# Patient Record
Sex: Female | Born: 1960
Health system: Southern US, Community
[De-identification: ages and names within clinical notes are randomized; demographics above are authoritative.]

## PROBLEM LIST (undated history)

## (undated) DIAGNOSIS — G35 Multiple sclerosis: Secondary | ICD-10-CM

## (undated) DIAGNOSIS — G709 Myoneural disorder, unspecified: Secondary | ICD-10-CM

## (undated) DIAGNOSIS — I10 Essential (primary) hypertension: Secondary | ICD-10-CM

## (undated) DIAGNOSIS — M1990 Unspecified osteoarthritis, unspecified site: Secondary | ICD-10-CM

## (undated) HISTORY — PX: UTERINE FIBROID SURGERY: SHX826

---

## 1997-08-26 DIAGNOSIS — G35 Multiple sclerosis: Secondary | ICD-10-CM | POA: Insufficient documentation

## 1998-07-07 ENCOUNTER — Ambulatory Visit (HOSPITAL_COMMUNITY): Admission: RE | Admit: 1998-07-07 | Discharge: 1998-07-07 | Payer: Self-pay | Admitting: Neurosurgery

## 1999-03-27 ENCOUNTER — Emergency Department (HOSPITAL_COMMUNITY): Admission: EM | Admit: 1999-03-27 | Discharge: 1999-03-27 | Payer: Self-pay | Admitting: Emergency Medicine

## 1999-03-27 ENCOUNTER — Encounter: Payer: Self-pay | Admitting: Emergency Medicine

## 1999-05-03 ENCOUNTER — Encounter: Admission: RE | Admit: 1999-05-03 | Discharge: 1999-05-10 | Payer: Self-pay

## 1999-09-03 ENCOUNTER — Other Ambulatory Visit: Admission: RE | Admit: 1999-09-03 | Discharge: 1999-09-03 | Payer: Self-pay | Admitting: *Deleted

## 1999-09-17 ENCOUNTER — Ambulatory Visit (HOSPITAL_COMMUNITY): Admission: RE | Admit: 1999-09-17 | Discharge: 1999-09-17 | Payer: Self-pay | Admitting: *Deleted

## 1999-09-17 ENCOUNTER — Encounter: Payer: Self-pay | Admitting: *Deleted

## 2000-09-01 ENCOUNTER — Other Ambulatory Visit: Admission: RE | Admit: 2000-09-01 | Discharge: 2000-09-01 | Payer: Self-pay | Admitting: *Deleted

## 2000-09-17 ENCOUNTER — Encounter: Payer: Self-pay | Admitting: *Deleted

## 2000-09-17 ENCOUNTER — Ambulatory Visit (HOSPITAL_COMMUNITY): Admission: RE | Admit: 2000-09-17 | Discharge: 2000-09-17 | Payer: Self-pay | Admitting: *Deleted

## 2001-07-29 ENCOUNTER — Other Ambulatory Visit: Admission: RE | Admit: 2001-07-29 | Discharge: 2001-07-29 | Payer: Self-pay | Admitting: Obstetrics & Gynecology

## 2002-02-15 ENCOUNTER — Inpatient Hospital Stay (HOSPITAL_COMMUNITY): Admission: AD | Admit: 2002-02-15 | Discharge: 2002-02-18 | Payer: Self-pay | Admitting: Obstetrics & Gynecology

## 2002-03-31 ENCOUNTER — Other Ambulatory Visit: Admission: RE | Admit: 2002-03-31 | Discharge: 2002-03-31 | Payer: Self-pay | Admitting: Obstetrics & Gynecology

## 2002-09-08 ENCOUNTER — Ambulatory Visit (HOSPITAL_COMMUNITY): Admission: RE | Admit: 2002-09-08 | Discharge: 2002-09-08 | Payer: Self-pay | Admitting: Obstetrics & Gynecology

## 2002-09-08 ENCOUNTER — Encounter: Payer: Self-pay | Admitting: Obstetrics & Gynecology

## 2002-09-13 ENCOUNTER — Encounter: Payer: Self-pay | Admitting: Obstetrics & Gynecology

## 2002-09-13 ENCOUNTER — Ambulatory Visit (HOSPITAL_COMMUNITY): Admission: RE | Admit: 2002-09-13 | Discharge: 2002-09-13 | Payer: Self-pay | Admitting: Obstetrics & Gynecology

## 2003-04-21 ENCOUNTER — Other Ambulatory Visit: Admission: RE | Admit: 2003-04-21 | Discharge: 2003-04-21 | Payer: Self-pay | Admitting: Obstetrics & Gynecology

## 2003-09-12 ENCOUNTER — Ambulatory Visit (HOSPITAL_COMMUNITY): Admission: RE | Admit: 2003-09-12 | Discharge: 2003-09-12 | Payer: Self-pay | Admitting: Obstetrics & Gynecology

## 2004-05-14 ENCOUNTER — Other Ambulatory Visit: Admission: RE | Admit: 2004-05-14 | Discharge: 2004-05-14 | Payer: Self-pay | Admitting: Obstetrics & Gynecology

## 2004-09-24 ENCOUNTER — Ambulatory Visit (HOSPITAL_COMMUNITY): Admission: RE | Admit: 2004-09-24 | Discharge: 2004-09-24 | Payer: Self-pay | Admitting: Obstetrics & Gynecology

## 2004-12-18 ENCOUNTER — Other Ambulatory Visit: Admission: RE | Admit: 2004-12-18 | Discharge: 2004-12-18 | Payer: Self-pay | Admitting: Obstetrics & Gynecology

## 2005-05-14 ENCOUNTER — Other Ambulatory Visit: Admission: RE | Admit: 2005-05-14 | Discharge: 2005-05-14 | Payer: Self-pay | Admitting: Obstetrics & Gynecology

## 2005-09-30 ENCOUNTER — Ambulatory Visit (HOSPITAL_COMMUNITY): Admission: RE | Admit: 2005-09-30 | Discharge: 2005-09-30 | Payer: Self-pay | Admitting: Obstetrics & Gynecology

## 2005-11-06 ENCOUNTER — Other Ambulatory Visit: Admission: RE | Admit: 2005-11-06 | Discharge: 2005-11-06 | Payer: Self-pay | Admitting: Obstetrics & Gynecology

## 2006-10-02 ENCOUNTER — Ambulatory Visit (HOSPITAL_COMMUNITY): Admission: RE | Admit: 2006-10-02 | Discharge: 2006-10-02 | Payer: Self-pay | Admitting: Obstetrics & Gynecology

## 2007-10-05 ENCOUNTER — Ambulatory Visit (HOSPITAL_COMMUNITY): Admission: RE | Admit: 2007-10-05 | Discharge: 2007-10-05 | Payer: Self-pay | Admitting: Obstetrics & Gynecology

## 2008-03-15 ENCOUNTER — Emergency Department (HOSPITAL_COMMUNITY): Admission: EM | Admit: 2008-03-15 | Discharge: 2008-03-15 | Payer: Self-pay | Admitting: Family Medicine

## 2008-10-07 ENCOUNTER — Encounter: Admission: RE | Admit: 2008-10-07 | Discharge: 2008-10-07 | Payer: Self-pay | Admitting: Obstetrics & Gynecology

## 2009-10-20 ENCOUNTER — Encounter: Admission: RE | Admit: 2009-10-20 | Discharge: 2009-10-20 | Payer: Self-pay | Admitting: Obstetrics & Gynecology

## 2010-05-18 ENCOUNTER — Ambulatory Visit (HOSPITAL_COMMUNITY): Admission: RE | Admit: 2010-05-18 | Discharge: 2010-05-18 | Payer: Self-pay | Admitting: Neurology

## 2010-09-15 ENCOUNTER — Other Ambulatory Visit: Payer: Self-pay | Admitting: Obstetrics & Gynecology

## 2010-09-15 DIAGNOSIS — Z1239 Encounter for other screening for malignant neoplasm of breast: Secondary | ICD-10-CM

## 2010-09-16 ENCOUNTER — Encounter: Payer: Self-pay | Admitting: Obstetrics & Gynecology

## 2010-10-22 ENCOUNTER — Ambulatory Visit
Admission: RE | Admit: 2010-10-22 | Discharge: 2010-10-22 | Disposition: A | Discharge status: Home / Self Care | Payer: Commercial Managed Care - PPO | Source: Ambulatory Visit | Attending: Obstetrics & Gynecology | Admitting: Obstetrics & Gynecology

## 2010-10-22 DIAGNOSIS — Z1239 Encounter for other screening for malignant neoplasm of breast: Secondary | ICD-10-CM

## 2011-01-11 NOTE — Discharge Summary (Signed)
[facility]  Patient:    Jill Hall, Jill Hall Visit Number: [id number] MRN: [id number]          Service Type: OBS Location: 910A 9109 01 Attending Physician:  Minette Headland Dictated by:   Julio Sicks, N.P. Admit Date:  02/15/2002 Discharge Date: 02/18/2002                             Discharge Summary  ADMISSION DIAGNOSES:          1. Intrauterine pregnancy at term.                               2. 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                               3. Advanced maternal age.                               4. History of multiple sclerosis.  DISCHARGE DIAGNOSES:          1. Primary cesarean delivery.                               2. Viable female infant.  PROCEDURE:                    Primary low transverse cesarean section.  REASON FOR ADMISSION:         Please see dictated H&P.  HOSPITAL COURSE:              The patient was admitted to [facility] for the above named procedure.  The patient was taken to the operating room where spinal anesthesia was administered without difficulty.  A low transverse incision was made with the delivery of a viable female infant weighing 6 pounds 10 ounces with Apgars of 8 at one minute and 9 at five minutes.  Umbilical cord pH was 7.31.  The patient tolerated the procedure well and was taken to the recovery room in stable condition.  On postoperative day one, the patient had adequate return of bowel function.  Abdomen was soft. Abdominal dressing was partially removed and incision was noted to be clean, dry and intact.  Foley had been discontinued and patient was voiding without difficulty.  The patient was tolerating a clear liquid diet without complaints of nausea and vomiting.  Labs revealed a hemoglobin of 9.1, hematocrit 27.2, and WBC count of 7.6.  On postoperative day two, abdomen was soft, fundus was firm, incision was clean,  dry and intact and staples were intact.  The patient was ambulating well.  On postoperative day three, the patient was doing well. Staples were removed and the patient was discharged to home.  CONDITION ON DISCHARGE:       Good.  DIET:                         Regular as tolerated.  ACTIVITY:  No heavy lifting.  No driving times two weeks. No vaginal entry.  FOLLOW-UP:                    The patient is to follow up in the office in one to two weeks for an incision check.  She is to call for a temperature greater than 100 degrees, persistent nausea or vomiting, heavy vaginal bleeding and/or redness or drainage from the incision site.  DISCHARGE MEDICATIONS:        1. Ibuprofen 600 mg q.6h.                               2. Prenatal vitamins one p.o. q.d.                               3. Hemocyte one p.o. q.d.                               4. Percocet 5/325, #30, one p.o. q.4-6h.                                  p.r.n. pain. Dictated by:   Julio Sicks, N.P. Attending Physician:  Minette Headland DD:  03/11/02 TD:  03/16/02 Job: [id number]

## 2011-01-11 NOTE — Op Note (Signed)
[facility]  Patient:    Jill Hall, Jill Hall Visit Number: [id number] MRN: [id number]          Service Type: OBS Location: 910B 9198 06 Attending Physician:  Minette Headland Dictated by:   Freddy Finner, M.D. Proc. Date: 02/15/02 Admit Date:  02/15/2002                             Operative Report  PREOPERATIVE DIAGNOSES:       1. Intrauterine pregnancy at term.                               2. Surgically scarred uterus from previous                                  myomectomy.  POSTOPERATIVE DIAGNOSES:      1. Intrauterine pregnancy at term.                               2. Surgically scarred uterus from previous                                  myomectomy.  OPERATION:                    Primary low vertical cesarean section with delivery of viable female infant, Apgars 8 and 9, cord pH 7.31, birth weight 6 pounds 10 ounces.  SURGEON:                      Freddy Finner, M.D.  ASSISTANT:                    Juluis Mire, M.D.  ANESTHESIA:                   Spinal.  ESTIMATED BLOOD LOSS:         600 cc.  INTRAOPERATIVE COMPLICATIONS:                None.  INDICATIONS:                  The patient is a 50 year old primigravida who was followed through an uneventful prenatal course to term.  She was admitted for a cesarean due to previous surgical scarring of the uterus.  DESCRIPTION OF PROCEDURE:     She was admitted on the morning of surgery and brought to the operating room and there placed under adequate spinal anesthesia, placed in the dorsal recumbent position.  The abdomen was prepped and draped in the usual fashion and a Foley catheter placed using sterile technique.  A lower abdominal transverse incision is made and carried sharply down to fascia which was entered sharply and extended to the extent of the skin incision.  The rectus sheath was developed superiorly and inferiorly with blunt and sharp dissection.  The rectus  muscles were divided in the midline, the peritoneum was entered sharply and extended bluntly to the extent of the skin incision.  A bladder blade was placed.  The bladder was dissected off the lower segment.  An initial attempt was made to make a transverse incision in the  lower segment, but it became immediately obvious that the uterine lower segment was so thickened that this was not the appropriate choice of incision and for that reason a low vertical incision was made.  Membranes were exposed and ruptured.  The infant was in a complete breech presentation and a breech extraction was then accomplished without difficulty.  Apgars and cord pH are noted above.  The placenta and other parts of conception were removed from the uterus.  The uterine incision was closed in a double layer with running-locking 0 Monocryl for each layer.  The bladder flap was reapproximated with an interrupted 0 Monocryl suture.  Irrigation was carried out and hemostasis was complete.  Extensive adhesions were palpable, no palpable enlargement of the ovary was noted on the left side.  On the right side the ovary was palpably normal.  There was also a paraovarian 2.5-cm cyst which was visualized and it was completely simple and benign in appearance. All packs, needles and instruments were removed from the abdomen.  The abdominal incision was then closed in layers; running 0 Monocryl was used to close the peritoneum and reapproximate the rectus muscles, the fascia was closed with a running 0 PDS, the skin was closed with wide skin staples and quarter-inch Steri-Strips.  The patient tolerated the procedure well and was taken to recovery in good condition. Dictated by:   Freddy Finner, M.D. Attending Physician:  Minette Headland DD:  02/15/02 TD:  02/15/02 Job: [id number]

## 2011-01-11 NOTE — H&P (Signed)
[facility]  Patient:    Jill Hall, Jill Hall Visit Number: [id number] MRN: [id number]          Service Type: Attending:  Freddy Finner, M.D. Dictated by:   Freddy Finner, M.D. Adm. Date:  02/15/02                           History and Physical  ADMISSION DIAGNOSES:          1. Intrauterine pregnancy at term.                               2. 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                               3. Advanced maternal age.  HISTORY OF PRESENT ILLNESS:   The patient is a 50 year old, black, married female, primigravida, who had previous myomectomy with deep incisions into the uterus.  This is her first pregnancy, which has been essentially uncomplicated.  She is known to have multiple sclerosis.  This has not presented a problem during her pregnancy.  Because of the myomectomy, she is admitted for surgical delivery.  REVIEW OF SYSTEMS:            Her current review of systems is negative.  PHYSICAL EXAMINATION:         HEENT is grossly normal.  NECK:                         The thyroid gland is not palpably enlarged.  VITAL SIGNS:                  Blood pressure 120/72.  CHEST:                        Clear to auscultation.  HEART:                        Normal sinus rhythm without murmurs, rubs, or gallops.  ABDOMEN:                      Gravid.  Estimated fetal size of 8 pounds.  EXTREMITIES:                  Without significant edema and without clubbing or cyanosis.  ASSESSMENT:                   1. Intrauterine pregnancy at term.                               2. Surgically scarred uterus.                               3. Advanced maternal age.                               4. History of multiple  sclerosis.  PLAN:                         Cesarean delivery. Dictated by:   Freddy Finner, M.D. Attending:  Freddy Finner, M.D. DD:  02/11/02 TD:  02/11/02 Job: [id number]

## 2011-07-26 ENCOUNTER — Other Ambulatory Visit: Payer: Self-pay | Admitting: Family Medicine

## 2011-07-26 ENCOUNTER — Other Ambulatory Visit: Payer: Self-pay | Admitting: Obstetrics & Gynecology

## 2011-07-26 DIAGNOSIS — R0989 Other specified symptoms and signs involving the circulatory and respiratory systems: Secondary | ICD-10-CM

## 2011-07-26 DIAGNOSIS — Z1231 Encounter for screening mammogram for malignant neoplasm of breast: Secondary | ICD-10-CM

## 2011-08-02 ENCOUNTER — Ambulatory Visit
Admission: RE | Admit: 2011-08-02 | Discharge: 2011-08-02 | Disposition: A | Discharge status: Home / Self Care | Payer: 59 | Source: Ambulatory Visit | Attending: Family Medicine | Admitting: Family Medicine

## 2011-08-02 DIAGNOSIS — R0989 Other specified symptoms and signs involving the circulatory and respiratory systems: Secondary | ICD-10-CM

## 2011-08-05 ENCOUNTER — Ambulatory Visit (INDEPENDENT_AMBULATORY_CARE_PROVIDER_SITE_OTHER): Payer: Self-pay | Admitting: Pharmacist

## 2011-08-05 ENCOUNTER — Encounter: Payer: Self-pay | Admitting: Pharmacist

## 2011-08-05 VITALS — BP 139/96 | HR 110 | Ht 67.0 in | Wt 201.8 lb

## 2011-08-05 DIAGNOSIS — Z309 Encounter for contraceptive management, unspecified: Secondary | ICD-10-CM | POA: Insufficient documentation

## 2011-08-05 DIAGNOSIS — G35 Multiple sclerosis: Secondary | ICD-10-CM

## 2011-08-05 NOTE — Assessment & Plan Note (Signed)
Following medication review, no suggestions for change.  Complete medication list provided to patient.  Total time in face to face medication review: 20 minutes.  Patient seen with: Albertine Grates, PharmD Resident

## 2011-08-05 NOTE — Progress Notes (Signed)
  Subjective:    Patient ID: Jill Hall, female    DOB: 1961/07/07, 49 y.o.   MRN: [id number]  HPI Patient arrives in good spirits for medication review.  Reports seeing Cammie Fulp as primary care provider,  Despina Arias as neurologist, and GYN [facility].  Reports being diagnosed with Multiple Sclerosisfor in 1999 (13 years) and states she is under acceptable level of control.      Review of Systems     Objective:   Physical Exam        Assessment & Plan:  Following medication review, no suggestions for change.  Complete medication list provided to patient.  Total time in face to face medication review: 20 minutes.  Patient seen with: Albertine Grates, PharmD Resident

## 2011-08-05 NOTE — Progress Notes (Signed)
  Subjective:    Patient ID: Jill Hall, female    DOB: 1961/06/05, 50 y.o.   MRN: [id number]  HPI Reviewed and agree with Dr. Macky Lower management.    Review of Systems     Objective:   Physical Exam        Assessment & Plan:

## 2011-10-24 ENCOUNTER — Ambulatory Visit
Admission: RE | Admit: 2011-10-24 | Discharge: 2011-10-24 | Disposition: A | Discharge status: Home / Self Care | Payer: 59 | Source: Ambulatory Visit | Attending: Obstetrics & Gynecology | Admitting: Obstetrics & Gynecology

## 2011-10-24 DIAGNOSIS — Z1231 Encounter for screening mammogram for malignant neoplasm of breast: Secondary | ICD-10-CM

## 2011-11-29 ENCOUNTER — Encounter (HOSPITAL_COMMUNITY): Payer: Self-pay

## 2011-11-29 ENCOUNTER — Emergency Department (INDEPENDENT_AMBULATORY_CARE_PROVIDER_SITE_OTHER)
Admission: EM | Admit: 2011-11-29 | Discharge: 2011-11-29 | Disposition: A | Discharge status: Home Health | Payer: 59 | Source: Home / Self Care | Attending: Family Medicine | Admitting: Family Medicine

## 2011-11-29 DIAGNOSIS — J302 Other seasonal allergic rhinitis: Secondary | ICD-10-CM

## 2011-11-29 DIAGNOSIS — J309 Allergic rhinitis, unspecified: Secondary | ICD-10-CM

## 2011-11-29 HISTORY — DX: Multiple sclerosis: G35

## 2011-11-29 MED ORDER — AZELASTINE HCL 0.15 % NA SOLN: 1.0000 | Freq: Two times a day (BID) | NASAL | Status: DC

## 2011-11-29 MED ORDER — FLUTICASONE PROPIONATE 50 MCG/ACT NA SUSP: 2.0000 | Freq: Every day | NASAL | Status: DC

## 2011-11-29 MED ORDER — METHYLPREDNISOLONE ACETATE 40 MG/ML IJ SUSP
80.0000 mg | Freq: Once | INTRAMUSCULAR | Status: AC
  Administered 2011-11-29: 80 mg via INTRAMUSCULAR

## 2011-11-29 MED ORDER — METHYLPREDNISOLONE ACETATE 80 MG/ML IJ SUSP
INTRAMUSCULAR | Status: AC
  Filled 2011-11-29: qty 1

## 2011-11-29 MED ORDER — CETIRIZINE HCL 10 MG PO TABS: 10.0000 mg | ORAL_TABLET | Freq: Every day | ORAL | Status: DC

## 2011-11-29 NOTE — ED Provider Notes (Signed)
History     CSN: [id number]  Arrival date & time 11/29/11  7829   First MD Initiated Contact with Patient 11/29/11 [phone]      Chief Complaint  Patient presents with  . Cough    (Consider location/radiation/quality/duration/timing/severity/associated sxs/prior treatment) Patient is a 51 y.o. female presenting with cough. The history is provided by the patient.  Cough This is a new problem. The current episode started 2 days ago. The problem has been gradually worsening. The cough is productive of sputum. There has been no fever. Associated symptoms include chest pain and rhinorrhea. Pertinent negatives include no shortness of breath and no wheezing. Risk factors: daughter has been similarly sick. She is not a smoker.    Past Medical History  Diagnosis Date  . Multiple sclerosis     Past Surgical History  Procedure Date  . Cesarean section   . Uterine fibroid surgery     No family history on file.  History  Substance Use Topics  . Smoking status: Never Smoker   . Smokeless tobacco: Never Used  . Alcohol Use: No    OB History    Grav Para Term Preterm Abortions TAB SAB Ect Mult Living                  Review of Systems  Constitutional: Negative.   HENT: Positive for congestion, rhinorrhea, sneezing and postnasal drip.   Respiratory: Positive for cough. Negative for shortness of breath and wheezing.   Cardiovascular: Positive for chest pain.  Gastrointestinal: Negative.     Allergies  Review of patient's allergies indicates no known allergies.  Home Medications   Current Outpatient Rx  Name Route Sig Dispense Refill  . NORETHIN ACE-ETH ESTRAD-FE 1-20 MG-MCG PO TABS Oral Take 1 tablet by mouth daily.    . AZELASTINE HCL 0.15 % NA SOLN Nasal Place 1 spray into the nose 2 (two) times daily. 30 mL 1  . CETIRIZINE HCL 10 MG PO TABS Oral Take 1 tablet (10 mg total) by mouth daily. One tab daily for allergies 30 tablet 1  . CHOLECALCIFEROL 2000 UNITS PO TABS Oral  Take 1 tablet (2,000 Units total) by mouth daily.    Marland Kitchen FLUTICASONE PROPIONATE 50 MCG/ACT NA SUSP Nasal Place 2 sprays into the nose daily. 1 g 2  . INTERFERON BETA-1B 0.3 MG [place] SOLR Subcutaneous Inject 0.25 mg into the skin every other day.    Darlis Loan ESTRAD TRIPHASIC 0.18/0.215/0.25 MG-35 MCG PO TABS Oral Take 1 tablet by mouth daily. Sunday  Starts      BP 145/83  Pulse 112  Temp(Src) 98.8 F (37.1 C) (Oral)  Resp 14  SpO2 99%  LMP 11/05/2011  Physical Exam  Nursing note and vitals reviewed. Constitutional: She is oriented to person, place, and time. She appears well-developed and well-nourished.  HENT:  Head: Normocephalic.  Right Ear: External ear normal.  Left Ear: External ear normal.  Nose: Mucosal edema and rhinorrhea present.  Mouth/Throat: Oropharynx is clear and moist.  Neck: Normal range of motion. Neck supple.  Cardiovascular: Regular rhythm and normal heart sounds.   Pulmonary/Chest: Effort normal and breath sounds normal.  Lymphadenopathy:    She has no cervical adenopathy.  Neurological: She is alert and oriented to person, place, and time.  Skin: Skin is warm and dry.  Psychiatric: She has a normal mood and affect.    ED Course  Procedures (including critical care time)  Labs Reviewed - No data to display No results  found.   1. Seasonal allergic rhinitis       MDM          Linna Hoff, MD 11/29/11 1053

## 2011-11-29 NOTE — ED Notes (Signed)
Pt c/o cough and congestion.  Productive cough with clear sputum onset Wednesday.  Pt states she has felt febrile at times.  Pt using Mucinex at home with some relief.

## 2012-06-23 ENCOUNTER — Ambulatory Visit: Payer: 59 | Admitting: Pharmacist

## 2012-06-29 ENCOUNTER — Ambulatory Visit (INDEPENDENT_AMBULATORY_CARE_PROVIDER_SITE_OTHER): Payer: 59 | Admitting: Pharmacist

## 2012-06-29 ENCOUNTER — Encounter: Payer: Self-pay | Admitting: Pharmacist

## 2012-06-29 VITALS — BP 157/90 | HR 96 | Ht 67.0 in | Wt 206.5 lb

## 2012-06-29 DIAGNOSIS — G35 Multiple sclerosis: Secondary | ICD-10-CM

## 2012-06-29 NOTE — Assessment & Plan Note (Signed)
Following medication review, no suggestions for change.  Complete medication list provided to patient.  Total time in face to face medication review: 15 minutes.  Patient seen with: Bola Lawuyi PharmD, Pharmacy Resident. 

## 2012-06-29 NOTE — Progress Notes (Signed)
  Subjective:    Patient ID: Jill Hall, female    DOB: 11-Feb-1961, 51 y.o.   MRN: [id number]  HPI Patient arrives in good spirits for medication review. Reports seeing Cammie Fulp as primary care provider, Despina Arias as neurologist, and GYN [facility]. Reports being diagnosed with Multiple Sclerosisfor in 1999 (13 years) and states she is under acceptable level of control.    Had recent sinus infection. "  Had 2 sinus infections this past year" requiring treatment with prednisone and antibiotics.   Review of Systems     Objective:   Physical Exam        Assessment & Plan:  Following medication review, no suggestions for change.  Complete medication list provided to patient.  Total time in face to face medication review: 15 minutes.  Patient seen with: Maryanna Shape PharmD, Pharmacy Resident.

## 2012-06-29 NOTE — Progress Notes (Signed)
Patient ID: Jill Hall, female   DOB: Jun 14, 1961, 51 y.o.   MRN: [id number] Reviewed and agree with Dr. Macky Lower documentation and management.

## 2012-06-29 NOTE — Patient Instructions (Addendum)
Thanks for coming in for your med review.

## 2012-08-24 ENCOUNTER — Other Ambulatory Visit: Payer: Self-pay | Admitting: Obstetrics & Gynecology

## 2012-08-24 DIAGNOSIS — Z1231 Encounter for screening mammogram for malignant neoplasm of breast: Secondary | ICD-10-CM

## 2012-10-26 ENCOUNTER — Ambulatory Visit
Admission: RE | Admit: 2012-10-26 | Discharge: 2012-10-26 | Disposition: A | Discharge status: Home / Self Care | Payer: 59 | Source: Ambulatory Visit | Attending: Obstetrics & Gynecology | Admitting: Obstetrics & Gynecology

## 2012-10-26 DIAGNOSIS — Z1231 Encounter for screening mammogram for malignant neoplasm of breast: Secondary | ICD-10-CM

## 2012-12-29 ENCOUNTER — Encounter (HOSPITAL_COMMUNITY): Payer: Self-pay | Admitting: Pharmacist

## 2013-01-08 ENCOUNTER — Encounter (HOSPITAL_COMMUNITY): Payer: Self-pay

## 2013-01-08 ENCOUNTER — Encounter (HOSPITAL_COMMUNITY)
Admission: RE | Admit: 2013-01-08 | Discharge: 2013-01-08 | Disposition: A | Discharge status: Home / Self Care | Payer: 59 | Source: Ambulatory Visit | Attending: Obstetrics and Gynecology | Admitting: Obstetrics and Gynecology

## 2013-01-08 DIAGNOSIS — Z01818 Encounter for other preprocedural examination: Secondary | ICD-10-CM | POA: Insufficient documentation

## 2013-01-08 DIAGNOSIS — Z01812 Encounter for preprocedural laboratory examination: Secondary | ICD-10-CM | POA: Insufficient documentation

## 2013-01-08 HISTORY — DX: Myoneural disorder, unspecified: G70.9

## 2013-01-08 LAB — CBC
HCT: 37.3 % (ref 36.0–46.0)
Hemoglobin: 12.7 g/dL (ref 12.0–15.0)
MCHC: 34 g/dL (ref 30.0–36.0)
RBC: 4.54 MIL/uL (ref 3.87–5.11)
WBC: 6.4 10*3/uL (ref 4.0–10.5)

## 2013-01-08 NOTE — Patient Instructions (Signed)
Your procedure is scheduled on:01/15/13  Enter through the Main Entrance at :6am Pick up desk phone and dial [phone] and inform us of your arrival.  Please call [phone] if you have any problems the morning of surgery.  Remember: Do not eat or drink after midnight:Thursday   DO NOT wear jewelry, eye make-up, lipstick,body lotion, or dark fingernail polish.   Patients discharged on the day of surgery will not be allowed to drive home.

## 2013-01-11 NOTE — H&P (Addendum)
Jill Hall  [id number]  12/30/12 Jill Hall is a 52 yo G1P1 with menorrhagia and endomettrial mass most consistent with an intracavitary fibroid.  Followed by Dr Jennette Kettle, she deisres surgical resection of this fiibroid.  Jill Hall presents today for preop evaluation.  She has a history of fibroids.  She has been having problems with menorrhagia.  She is currently on Lo-Loestrin.  Saline infusion ultrasound recently showed a 2.5 cm intracavitary mass consistent with a fibroid.  Reviewed the past saline infusion ultrasound and past labs.  She is not menopausal.  Discussed the procedure at length, its risks and benefits and success and complication rates.  Discussed the fact that these could recur, may or may not help the bleeding.  Will send this off to pathology.  The risks include but are not limited to risk of bleeding, damage to uterus, tubes, ovaries, bowel or bladder, risks associated with anesthesia or blood transfusion or having to open her up if there are any complications.  Answered all of her questions.  She does give informed consent. Dineen Kid Rana Snare, MD/rg  This patient has been seen and examined.   All of her questions were answered.  Labs and vital signs reviewed.  Informed consent has been obtained.  The History and Physical is current.This patient has been seen and examined.   All of her questions were answered.  Labs and vital signs reviewed.  Informed consent has been obtained.  The History and Physical is current. 01/15/13 DL

## 2013-01-14 MED ORDER — DEXTROSE 5 % IV SOLN
2.0000 g | INTRAVENOUS | Status: AC
  Administered 2013-01-15: 2 g via INTRAVENOUS
  Filled 2013-01-14: qty 2

## 2013-01-15 ENCOUNTER — Encounter (HOSPITAL_COMMUNITY)
Admission: RE | Disposition: A | Discharge status: Home / Self Care | Payer: Self-pay | Source: Ambulatory Visit | Attending: Obstetrics and Gynecology

## 2013-01-15 ENCOUNTER — Ambulatory Visit (HOSPITAL_COMMUNITY)
Admission: RE | Admit: 2013-01-15 | Discharge: 2013-01-15 | Disposition: A | Discharge status: Home / Self Care | Payer: 59 | Source: Ambulatory Visit | Attending: Obstetrics and Gynecology | Admitting: Obstetrics and Gynecology

## 2013-01-15 ENCOUNTER — Encounter (HOSPITAL_COMMUNITY): Payer: Self-pay | Admitting: *Deleted

## 2013-01-15 ENCOUNTER — Ambulatory Visit (HOSPITAL_COMMUNITY): Payer: 59 | Admitting: Anesthesiology

## 2013-01-15 ENCOUNTER — Encounter (HOSPITAL_COMMUNITY): Payer: Self-pay | Admitting: Anesthesiology

## 2013-01-15 DIAGNOSIS — Z9889 Other specified postprocedural states: Secondary | ICD-10-CM

## 2013-01-15 DIAGNOSIS — N920 Excessive and frequent menstruation with regular cycle: Secondary | ICD-10-CM | POA: Insufficient documentation

## 2013-01-15 DIAGNOSIS — N840 Polyp of corpus uteri: Secondary | ICD-10-CM | POA: Insufficient documentation

## 2013-01-15 DIAGNOSIS — D250 Submucous leiomyoma of uterus: Secondary | ICD-10-CM | POA: Insufficient documentation

## 2013-01-15 LAB — PREGNANCY, URINE: Preg Test, Ur: NEGATIVE

## 2013-01-15 SURGERY — DILATATION & CURETTAGE/HYSTEROSCOPY WITH TRUCLEAR
Anesthesia: General | Site: Vagina | Wound class: Clean Contaminated

## 2013-01-15 MED ORDER — ONDANSETRON HCL 4 MG/2ML IJ SOLN
INTRAMUSCULAR | Status: DC | PRN
  Administered 2013-01-15: 4 mg via INTRAVENOUS

## 2013-01-15 MED ORDER — LACTATED RINGERS IV SOLN
INTRAVENOUS | Status: DC
  Administered 2013-01-15: 50 mL/h via INTRAVENOUS

## 2013-01-15 MED ORDER — MIDAZOLAM HCL 2 MG/2ML IJ SOLN
INTRAMUSCULAR | Status: AC
  Filled 2013-01-15: qty 2

## 2013-01-15 MED ORDER — FENTANYL CITRATE 0.05 MG/ML IJ SOLN: 25.0000 ug | INTRAMUSCULAR | Status: DC | PRN

## 2013-01-15 MED ORDER — PROMETHAZINE HCL 25 MG/ML IJ SOLN: 6.2500 mg | INTRAMUSCULAR | Status: DC | PRN

## 2013-01-15 MED ORDER — PROPOFOL 10 MG/ML IV BOLUS
INTRAVENOUS | Status: DC | PRN
  Administered 2013-01-15: 200 mg via INTRAVENOUS

## 2013-01-15 MED ORDER — FENTANYL CITRATE 0.05 MG/ML IJ SOLN
INTRAMUSCULAR | Status: AC
  Filled 2013-01-15: qty 2

## 2013-01-15 MED ORDER — LIDOCAINE HCL (CARDIAC) 20 MG/ML IV SOLN
INTRAVENOUS | Status: AC
  Filled 2013-01-15: qty 5

## 2013-01-15 MED ORDER — KETOROLAC TROMETHAMINE 30 MG/ML IJ SOLN
INTRAMUSCULAR | Status: AC
  Filled 2013-01-15: qty 1

## 2013-01-15 MED ORDER — KETOROLAC TROMETHAMINE 30 MG/ML IJ SOLN
INTRAMUSCULAR | Status: DC | PRN
  Administered 2013-01-15: 30 mg via INTRAVENOUS

## 2013-01-15 MED ORDER — SODIUM CHLORIDE 0.9 % IR SOLN
Status: DC | PRN
  Administered 2013-01-15: 3000 mL

## 2013-01-15 MED ORDER — LIDOCAINE HCL (CARDIAC) 20 MG/ML IV SOLN
INTRAVENOUS | Status: DC | PRN
  Administered 2013-01-15: 30 mg via INTRAVENOUS
  Administered 2013-01-15: 40 mg via INTRAVENOUS

## 2013-01-15 MED ORDER — PROPOFOL 10 MG/ML IV EMUL
INTRAVENOUS | Status: AC
  Filled 2013-01-15: qty 20

## 2013-01-15 MED ORDER — MIDAZOLAM HCL 2 MG/2ML IJ SOLN: 0.5000 mg | Freq: Once | INTRAMUSCULAR | Status: DC | PRN

## 2013-01-15 MED ORDER — MIDAZOLAM HCL 5 MG/5ML IJ SOLN
INTRAMUSCULAR | Status: DC | PRN
  Administered 2013-01-15: 2 mg via INTRAVENOUS

## 2013-01-15 MED ORDER — FENTANYL CITRATE 0.05 MG/ML IJ SOLN
INTRAMUSCULAR | Status: DC | PRN
  Administered 2013-01-15 (×2): 50 ug via INTRAVENOUS

## 2013-01-15 MED ORDER — FENTANYL CITRATE 0.05 MG/ML IJ SOLN
INTRAMUSCULAR | Status: AC
  Administered 2013-01-15: 50 ug via INTRAVENOUS
  Filled 2013-01-15: qty 2

## 2013-01-15 MED ORDER — KETOROLAC TROMETHAMINE 30 MG/ML IJ SOLN: 15.0000 mg | Freq: Once | INTRAMUSCULAR | Status: DC | PRN

## 2013-01-15 MED ORDER — MEPERIDINE HCL 25 MG/ML IJ SOLN: 6.2500 mg | INTRAMUSCULAR | Status: DC | PRN

## 2013-01-15 MED ORDER — ONDANSETRON HCL 4 MG/2ML IJ SOLN
INTRAMUSCULAR | Status: AC
  Filled 2013-01-15: qty 2

## 2013-01-15 SURGICAL SUPPLY — 19 items
BLADE INCISOR TRUC PLUS 2.9 (ABLATOR) IMPLANT
CANISTERS HI-FLOW 3000CC (CANNISTER) ×2 IMPLANT
CATH ROBINSON RED A/P 16FR (CATHETERS) ×2 IMPLANT
CLOTH BEACON ORANGE TIMEOUT ST (SAFETY) ×2 IMPLANT
CONTAINER PREFILL 10% NBF 60ML (FORM) ×4 IMPLANT
DRESSING TELFA 8X3 (GAUZE/BANDAGES/DRESSINGS) ×2 IMPLANT
ELECT REM PT RETURN 9FT ADLT (ELECTROSURGICAL)
ELECTRODE REM PT RTRN 9FT ADLT (ELECTROSURGICAL) IMPLANT
GLOVE BIO SURGEON STRL SZ8 (GLOVE) ×2 IMPLANT
GLOVE SURG ORTHO 8.0 STRL STRW (GLOVE) ×2 IMPLANT
GOWN STRL REIN XL XLG (GOWN DISPOSABLE) ×4 IMPLANT
INCISOR TRUC PLUS BLADE 2.9 (ABLATOR)
KIT HYSTEROSCOPY TRUCLEAR (ABLATOR) ×2 IMPLANT
MORCELLATOR RECIP TRUCLEAR 4.0 (ABLATOR) ×2 IMPLANT
MORCELLATOR ROTARY HYSTERO (ABLATOR) ×2 IMPLANT
PACK VAGINAL MINOR WOMEN LF (CUSTOM PROCEDURE TRAY) ×2 IMPLANT
PAD OB MATERNITY 4.3X12.25 (PERSONAL CARE ITEMS) ×2 IMPLANT
TOWEL OR 17X24 6PK STRL BLUE (TOWEL DISPOSABLE) ×4 IMPLANT
WATER STERILE IRR 1000ML POUR (IV SOLUTION) ×2 IMPLANT

## 2013-01-15 NOTE — Transfer of Care (Signed)
Immediate Anesthesia Transfer of Care Note  Patient: Jill Hall  Procedure(s) Performed: Procedure(s): DILATATION & CURETTAGE/HYSTEROSCOPY WITH TRUECLEAR (N/A)  Patient Location: PACU  Anesthesia Type:General  Level of Consciousness: awake, oriented and patient cooperative  Airway & Oxygen Therapy: Patient Spontanous Breathing and Patient connected to nasal cannula oxygen  Post-op Assessment: Report given to PACU RN and Post -op Vital signs reviewed and stable  Post vital signs: Reviewed and stable  Complications: No apparent anesthesia complications

## 2013-01-15 NOTE — Anesthesia Preprocedure Evaluation (Signed)
Anesthesia Evaluation  Patient identified by MRN, date of birth, ID band Patient awake    Reviewed: Allergy & Precautions, H&P , Patient's Chart, lab work & pertinent test results, reviewed documented beta blocker date and time   History of Anesthesia Complications Negative for: history of anesthetic complications  Airway Mallampati: II TM Distance: >3 FB Neck ROM: full    Dental no notable dental hx.    Pulmonary neg pulmonary ROS,  breath sounds clear to auscultation  Pulmonary exam normal       Cardiovascular Exercise Tolerance: Good negative cardio ROS  Rhythm:regular Rate:Normal     Neuro/Psych  Neuromuscular disease negative neurological ROS  negative psych ROS   GI/Hepatic negative GI ROS, Neg liver ROS,   Endo/Other  negative endocrine ROS  Renal/GU negative Renal ROS     Musculoskeletal   Abdominal   Peds  Hematology negative hematology ROS (+)   Anesthesia Other Findings Multiple sclerosis     Neuromuscular disorder   Multiple Sclerosis- affects her vision only   Reproductive/Obstetrics negative OB ROS                           Anesthesia Physical Anesthesia Plan  ASA: III  Anesthesia Plan: General LMA   Post-op Pain Management:    Induction:   Airway Management Planned:   Additional Equipment:   Intra-op Plan:   Post-operative Plan:   Informed Consent: I have reviewed the patients History and Physical, chart, labs and discussed the procedure including the risks, benefits and alternatives for the proposed anesthesia with the patient or authorized representative who has indicated his/her understanding and acceptance.   Dental Advisory Given  Plan Discussed with: CRNA, Surgeon and Anesthesiologist  Anesthesia Plan Comments:         Anesthesia Quick Evaluation

## 2013-01-15 NOTE — Op Note (Signed)
NAMEJASHIYA, BASSETT                 ACCOUNT NO.:  [id number]  MEDICAL RECORD NO.:  [id number]  LOCATION:  [facility]                          FACILITY:  [facility]  PHYSICIAN:  Dineen Kid. Rana Snare, M.D.    DATE OF BIRTH:  [birth year]  DATE OF PROCEDURE:  01/15/2013 DATE OF DISCHARGE:                              OPERATIVE REPORT   PREOPERATIVE DIAGNOSES:  Abnormal uterine bleeding, endometrial masses.  POSTOPERATIVE DIAGNOSES:  Abnormal uterine bleeding, endometrial masses, endometrial polyps and submucosal fibroids.  PROCEDURE:  Hysteroscopy with true clear resection of endometrial masses.  SURGEON:  Dineen Kid. Rana Snare, M.D.  ANESTHESIA:  General endotracheal.  INDICATIONS:  Ms. Kem is a 52 year old with abnormal uterine bleeding and menorrhagia, underwent saline infusion, ultrasound showing several large endometrial masses, most suspicious for intracavitary submucosal fibroid.  She presents today for definitive surgical intervention and removal of these.  Plan, resection of the endometrial masses.  Risks and benefits of the procedure were discussed at length.  Informed consent was obtained.  FINDINGS AT TIME OF SURGERY:  Two moderate-sized endometrial polyps and one submucosal fibroid.  Otherwise, normal-appearing endometrial cavity.  DESCRIPTION OF PROCEDURE:  After adequate analgesia, the patient was placed in dorsal lithotomy position.  She was sterilely prepped and draped.  Bladder sterilely drained.  Graves speculum was placed. Tenaculum was placed on the anterior lip of the cervix.  Uterus sounded to #[facility] dilator.  Hysteroscope was inserted.  The above findings were noted.  True clear device was inserted and under direct visualization, the masses were resected until completely removed at the level of the endometrial wall.  A small submucosal fibroid in the left fundus was also resected, it was only approximately 1 cm in size, but whole submucosal fibroid did pop into the device and was  removed.  After resection of the polyps and the fibroids, no residual masses were noted, normal-appearing endometrial lining noted, normal-appearing ostia, normal-appearing cervix.  The hysteroscope was then removed.  Tenaculum removed from the cervix, noted to be hemostatic.  The patient then transferred to recovery room in stable condition.  Sponge and needle count was normal x3.  Saline deficit was minimal.  The patient received 1 g of cefotetan preoperatively and 30 mg Toradol intraoperatively.  DISPOSITION:  Patient will be discharged home.  Follow up in the office in 2-3 weeks.  Sent with routine instruction for D and C, told to return for increased pain, fever, or bleeding.     Dineen Kid Rana Snare, M.D.     DCL/MEDQ  D:  01/15/2013  T:  01/15/2013  Job:  [id number]

## 2013-01-15 NOTE — Brief Op Note (Signed)
01/15/2013  7:57 AM  PATIENT:  Jill Hall  52 y.o. female  PRE-OPERATIVE DIAGNOSIS:  submucose fibroid  POST-OPERATIVE DIAGNOSIS:  submucose fibroid  PROCEDURE:  Procedure(s): DILATATION & CURETTAGE/HYSTEROSCOPY WITH TRUECLEAR (N/A)  SURGEON:  Surgeon(s) and Role:    * Turner Daniels, MD - Primary  PHYSICIAN ASSISTANT:   ASSISTANTS: none   ANESTHESIA:   general  EBL:  Total I/O In: 0  Out: 200 [Urine:200]  BLOOD ADMINISTERED:none  DRAINS: none   LOCAL MEDICATIONS USED:  NONE  SPECIMEN:  Source of Specimen:  intrauterine masses  DISPOSITION OF SPECIMEN:  PATHOLOGY  COUNTS:  YES  TOURNIQUET:  * No tourniquets in log *  DICTATION: .Other Dictation: Dictation Number 1  PLAN OF CARE: Discharge to home after PACU  PATIENT DISPOSITION:  PACU - hemodynamically stable.   Delay start of Pharmacological VTE agent (>24hrs) due to surgical blood loss or risk of bleeding: not applicable

## 2013-01-19 NOTE — Anesthesia Postprocedure Evaluation (Signed)
  Anesthesia Post-op Note  Patient: Jill Hall  Procedure(s) Performed: Procedure(s): DILATATION & CURETTAGE/HYSTEROSCOPY WITH TRUECLEAR (N/A) Patient is awake and responsive. Pain and nausea are reasonably well controlled. Vital signs are stable and clinically acceptable. Oxygen saturation is clinically acceptable. There are no apparent anesthetic complications at this time. Patient is ready for discharge.

## 2013-04-14 ENCOUNTER — Encounter (HOSPITAL_COMMUNITY): Payer: Self-pay | Admitting: Emergency Medicine

## 2013-04-14 ENCOUNTER — Emergency Department (HOSPITAL_COMMUNITY)
Admission: EM | Admit: 2013-04-14 | Discharge: 2013-04-15 | Disposition: A | Discharge status: Home / Self Care | Payer: 59 | Attending: Emergency Medicine | Admitting: Emergency Medicine

## 2013-04-14 DIAGNOSIS — R1110 Vomiting, unspecified: Secondary | ICD-10-CM | POA: Insufficient documentation

## 2013-04-14 DIAGNOSIS — J3489 Other specified disorders of nose and nasal sinuses: Secondary | ICD-10-CM | POA: Insufficient documentation

## 2013-04-14 DIAGNOSIS — Z8739 Personal history of other diseases of the musculoskeletal system and connective tissue: Secondary | ICD-10-CM | POA: Insufficient documentation

## 2013-04-14 DIAGNOSIS — R0981 Nasal congestion: Secondary | ICD-10-CM

## 2013-04-14 DIAGNOSIS — Z8669 Personal history of other diseases of the nervous system and sense organs: Secondary | ICD-10-CM | POA: Insufficient documentation

## 2013-04-14 DIAGNOSIS — Z79899 Other long term (current) drug therapy: Secondary | ICD-10-CM | POA: Insufficient documentation

## 2013-04-14 DIAGNOSIS — Z3202 Encounter for pregnancy test, result negative: Secondary | ICD-10-CM | POA: Insufficient documentation

## 2013-04-14 NOTE — ED Notes (Signed)
PT. REPORTS MID ABDOMINAL PAIN WITH EMESIS X5 , SINUS CONGESTION AND RIGHT NECK PAIN ONSET THIS EVENING .

## 2013-04-15 ENCOUNTER — Emergency Department (HOSPITAL_COMMUNITY): Payer: 59

## 2013-04-15 LAB — URINALYSIS, ROUTINE W REFLEX MICROSCOPIC
Bilirubin Urine: NEGATIVE
Hgb urine dipstick: NEGATIVE
Nitrite: NEGATIVE
Specific Gravity, Urine: 1.019 (ref 1.005–1.030)
pH: 5 (ref 5.0–8.0)

## 2013-04-15 LAB — LACTIC ACID, PLASMA: Lactic Acid, Venous: 1.5 mmol/L (ref 0.5–2.2)

## 2013-04-15 LAB — COMPREHENSIVE METABOLIC PANEL
ALT: 12 U/L (ref 0–35)
Albumin: 3.4 g/dL — ABNORMAL LOW (ref 3.5–5.2)
Alkaline Phosphatase: 63 U/L (ref 39–117)
Potassium: 3.5 mEq/L (ref 3.5–5.1)
Sodium: 139 mEq/L (ref 135–145)
Total Protein: 8 g/dL (ref 6.0–8.3)

## 2013-04-15 LAB — CBC WITH DIFFERENTIAL/PLATELET
Basophils Relative: 0 % (ref 0–1)
Eosinophils Absolute: 0.1 10*3/uL (ref 0.0–0.7)
MCH: 28.4 pg (ref 26.0–34.0)
MCHC: 34.8 g/dL (ref 30.0–36.0)
Neutrophils Relative %: 77 % (ref 43–77)
Platelets: 309 10*3/uL (ref 150–400)
RDW: 15 % (ref 11.5–15.5)

## 2013-04-15 LAB — POCT I-STAT TROPONIN I: Troponin i, poc: 0.01 ng/mL (ref 0.00–0.08)

## 2013-04-15 LAB — PREGNANCY, URINE: Preg Test, Ur: NEGATIVE

## 2013-04-15 MED ORDER — SODIUM CHLORIDE 0.9 % IV BOLUS (SEPSIS)
1000.0000 mL | Freq: Once | INTRAVENOUS | Status: AC
  Administered 2013-04-15: 1000 mL via INTRAVENOUS

## 2013-04-15 NOTE — ED Notes (Signed)
Patient transported to X-ray 

## 2013-04-15 NOTE — ED Provider Notes (Signed)
CSN: [id number]     Arrival date & time 04/14/13  2323 History     First MD Initiated Contact with Patient 04/15/13 0045     Chief Complaint  Patient presents with  . Abdominal Cramping   (Consider location/radiation/quality/duration/timing/severity/associated sxs/prior Treatment) The history is provided by the patient. No language interpreter was used.  Jill Hall is a 52 year old female with past medical history of multiple sclerosis and neuromuscular disorder presenting to the emergency department with sudden onset of emesis that started at approximately 5:00PM today. Patient reported that just after eating the meal she had the sudden urge to vomit, reported that she had at least 5 episodes of emesis today first food than mucus consistency, denied blood and bile. Patient reported that she had abdominal soreness secondary to continuous emesis episodes. Patient reported that she has some mild right sided facial pressure with nasal congestion, reported that she's been continuously blowing her nose. Patient reported that she feels that her right ear is clogged. Patient reported that she just returned from the beach today, was at the beach over the weekend and reported swimming in the ocean and in the pool. Patient reported that nothing makes the discomfort better or worse. Denied abdominal pain, diarrhea, melena, hematochezia, chest pain, shortness of breath, difficulty breathing, nausea, urination, numbness, tingling, dizziness, headache, cough, otorrhea, ear and eye complaints, sick contacts. PCP Dr. Cottie Banda  Past Medical History  Diagnosis Date  . Multiple sclerosis   . Neuromuscular disorder     Multiple Sclerosis- affects her vision only   Past Surgical History  Procedure Laterality Date  . Cesarean section    . Uterine fibroid surgery     No family history on file. History  Substance Use Topics  . Smoking status: Never Smoker   . Smokeless tobacco: Never Used  . Alcohol Use: No    OB History   Grav Para Term Preterm Abortions TAB SAB Ect Mult Living                 Review of Systems  Constitutional: Negative for fever and chills.  HENT: Positive for congestion. Negative for neck pain and neck stiffness. Ear pain: right.   Respiratory: Negative for cough, chest tightness and shortness of breath.   Cardiovascular: Negative for chest pain.  Gastrointestinal: Positive for vomiting. Negative for nausea, abdominal pain, diarrhea, constipation, blood in stool and anal bleeding.  Musculoskeletal: Negative for back pain and arthralgias.  Neurological: Negative for dizziness, weakness and headaches.  All other systems reviewed and are negative.    Allergies  Pineapple  Home Medications   Current Outpatient Rx  Name  Route  Sig  Dispense  Refill  . Cholecalciferol 2000 UNITS TABS   Oral   Take 1 tablet (2,000 Units total) by mouth daily.         . interferon beta-1b (BETASERON) 0.3 MG injection   Subcutaneous   Inject 0.25 mg into the skin every other day.          . Multiple Vitamin (MULTIVITAMIN WITH MINERALS) TABS   Oral   Take 1 tablet by mouth daily.         . norethindrone-ethinyl estradiol (JUNEL FE,GILDESS FE,LOESTRIN FE) 1-20 MG-MCG tablet   Oral   Take 1 tablet by mouth daily.          BP 148/86  Pulse 90  Temp(Src) 98.6 F (37 C) (Oral)  Resp 18  SpO2 100%  LMP 03/30/2013 Physical Exam  Nursing  note and vitals reviewed. Constitutional: She is oriented to person, place, and time. She appears well-developed and well-nourished. No distress.  HENT:  Head: Normocephalic and atraumatic.  Mouth/Throat: Oropharynx is clear and moist. No oropharyngeal exudate.  Dry mucous membranes Negative facial pain noted upon palpation  Eyes: Conjunctivae and EOM are normal. Pupils are equal, round, and reactive to light. Right eye exhibits no discharge. Left eye exhibits no discharge.  Neck: Normal range of motion. Neck supple.  Negative nuchal  rigidity Negative neck stiffness Negative pain upon palpation to the cervical spine Negative cervical lymphadenopathy noted  Cardiovascular: Normal rate, regular rhythm and normal heart sounds.  Exam reveals no friction rub.   No murmur heard. Pulses:      Radial pulses are 2+ on the right side, and 2+ on the left side.       Dorsalis pedis pulses are 2+ on the right side, and 2+ on the left side.  Pulmonary/Chest: Effort normal and breath sounds normal. No respiratory distress. She has no wheezes. She has no rales.  Abdominal: Soft. Bowel sounds are normal. She exhibits no distension. There is no tenderness. There is no rebound and no guarding.  Musculoskeletal: Normal range of motion.  Lymphadenopathy:    She has no cervical adenopathy.  Neurological: She is alert and oriented to person, place, and time. She exhibits normal muscle tone. Coordination normal.  Skin: Skin is warm and dry. No rash noted. She is not diaphoretic. No erythema.  Psychiatric: She has a normal mood and affect. Her behavior is normal. Thought content normal.    ED Course   Procedures (including critical care time)   Date: 04/15/2013  Rate: 95  Rhythm: normal sinus rhythm  QRS Axis: normal  Intervals: normal  ST/T Wave abnormalities: nonspecific T wave changes  Conduction Disutrbances:none  Narrative Interpretation:   Old EKG Reviewed: none available   Labs Reviewed  COMPREHENSIVE METABOLIC PANEL - Abnormal; Notable for the following:    Glucose, Bld 101 (*)    Albumin 3.4 (*)    Total Bilirubin 0.2 (*)    All other components within normal limits  URINALYSIS, ROUTINE W REFLEX MICROSCOPIC - Abnormal; Notable for the following:    Ketones, ur 15 (*)    All other components within normal limits  CBC WITH DIFFERENTIAL  LACTIC ACID, PLASMA  PREGNANCY, URINE  POCT I-STAT TROPONIN I   Dg Chest 2 View  04/15/2013   *RADIOLOGY REPORT*  Clinical Data: Vomiting.  Abdominal cramping.  No chest complaints.   CHEST - 2 VIEW  Comparison: None.  Findings: The heart size and pulmonary vascularity are normal. The lungs appear clear and expanded without focal air space disease or consolidation. No blunting of the costophrenic angles.  Slight atelectasis or fibrosis in the right lung base.  No pneumothorax. Mediastinal contours appear intact.  Mild degenerative changes in the spine.  IMPRESSION: No evidence of active pulmonary disease.   Original Report Authenticated By: Burman Nieves, M.D.   1. Emesis   2. Nasal congestion     MDM  Patient presenting to the emergency department with sudden onset of emesis that occurred today with right-sided facial pressure, nasal congestion.  Alert and oriented. Lungs clear to auscultation bilaterally. Heart rate and rhythm normal. Negative acute abdomen, negative peritoneal sign. Unremarkable dominant exam, patient reported that she no longer has any pain. Negative pain upon palpation to the frontal and maxillary sinuses bilaterally. Negative facial swelling or asymmetry noted. Mildly dry mucous membranes. EKG  negative ischemic findings noted. Troponin negative elevation. Urinalysis negative findings for infection. Urine pregnancy negative. CBC negative findings. CMP negative findings. Lactic acid negative elevation. Chest x-ray negative findings for acute cardiopulmonary disease. Patient given IV fluids while in ED setting. Patient tolerated by mouth challenge. Patient stable, afebrile. Doubt cardiac in nature. Definitive etiology of emesis unknown, patient believes that it is too to the heavy amounts of food that she ate that has not been normal for her while on vacation. Patient appears well and is eager to go home. Discharge patient. Discussed with patient diet, eat light for next couple of days. Discussed with patient to rest and stay hydrated. Referred patient to PCP to be reevaluated by the end of this week or early next week. Discussed with patient to continue monitor  symptoms if symptoms are to worsen or change to report back to emergency department immediately-strict return instructions given. Patient agreed to plan of care, understood, all questions answered.       Raymon Mutton, PA-C 04/15/13 [phone]

## 2013-04-15 NOTE — ED Provider Notes (Signed)
Medical screening examination/treatment/procedure(s) were performed by non-physician practitioner and as supervising physician I was immediately available for consultation/collaboration.  Olivia Mackie, MD 04/15/13 2102

## 2013-07-01 ENCOUNTER — Other Ambulatory Visit: Payer: Self-pay

## 2013-08-17 ENCOUNTER — Other Ambulatory Visit: Payer: Self-pay

## 2013-08-17 DIAGNOSIS — Z1231 Encounter for screening mammogram for malignant neoplasm of breast: Secondary | ICD-10-CM

## 2013-10-27 ENCOUNTER — Ambulatory Visit
Admission: RE | Admit: 2013-10-27 | Discharge: 2013-10-27 | Disposition: A | Discharge status: Home / Self Care | Payer: 59 | Source: Ambulatory Visit

## 2013-10-27 DIAGNOSIS — Z1231 Encounter for screening mammogram for malignant neoplasm of breast: Secondary | ICD-10-CM

## 2014-05-26 ENCOUNTER — Other Ambulatory Visit (HOSPITAL_COMMUNITY): Payer: Self-pay | Admitting: Neurology

## 2014-05-26 DIAGNOSIS — G35 Multiple sclerosis: Secondary | ICD-10-CM

## 2014-06-09 ENCOUNTER — Ambulatory Visit (HOSPITAL_COMMUNITY)
Admission: RE | Admit: 2014-06-09 | Discharge: 2014-06-09 | Disposition: A | Discharge status: Home / Self Care | Payer: 59 | Source: Ambulatory Visit | Attending: Neurology | Admitting: Neurology

## 2014-06-09 DIAGNOSIS — G35 Multiple sclerosis: Secondary | ICD-10-CM | POA: Insufficient documentation

## 2014-06-09 MED ORDER — GADOBENATE DIMEGLUMINE 529 MG/ML IV SOLN
20.0000 mL | Freq: Once | INTRAVENOUS | Status: AC | PRN
  Administered 2014-06-09: 20 mL via INTRAVENOUS

## 2014-09-13 ENCOUNTER — Telehealth: Payer: Self-pay | Admitting: Neurology

## 2014-09-13 NOTE — Telephone Encounter (Signed)
Olegario Shearer is calling to say she needs you to check out one additional thing on patient's SRF form so they can send medication.  Please call and advise.

## 2014-09-28 ENCOUNTER — Encounter: Payer: Self-pay | Admitting: *Deleted

## 2014-10-05 NOTE — Telephone Encounter (Signed)
Spoke with husband Braulio Conte and left message with him for Pam to call.  Her Gilenya has been delivered and I need to schedule her fdo/fim

## 2014-10-12 ENCOUNTER — Ambulatory Visit: Payer: Self-pay | Admitting: Neurology

## 2014-10-19 ENCOUNTER — Encounter: Payer: Self-pay | Admitting: Neurology

## 2014-10-19 ENCOUNTER — Ambulatory Visit (INDEPENDENT_AMBULATORY_CARE_PROVIDER_SITE_OTHER): Payer: 59 | Admitting: Neurology

## 2014-10-19 ENCOUNTER — Telehealth: Payer: Self-pay | Admitting: Neurology

## 2014-10-19 VITALS — BP 138/84 | HR 70 | Resp 14 | Ht 66.0 in | Wt 210.0 lb

## 2014-10-19 DIAGNOSIS — G35 Multiple sclerosis: Secondary | ICD-10-CM | POA: Diagnosis not present

## 2014-10-19 DIAGNOSIS — Z79899 Other long term (current) drug therapy: Secondary | ICD-10-CM

## 2014-10-19 NOTE — Telephone Encounter (Signed)
Jocelyn Lamer with Gilenya is calling regarding the patient. Jocelyn Lamer wants to know if the first dose of Gilenya has been scheduled for the patient. Please call and advise. Thank you.

## 2014-10-19 NOTE — Telephone Encounter (Signed)
Spoke with Vicky and advised Gilenya fdo is sched. for Monday 10-24-14, here at [facility]/fim

## 2014-10-19 NOTE — Progress Notes (Signed)
Baseline EKG in preparation for Gilenya start showed normal rate, rhythm and intervals.   No ischemic changes

## 2014-10-24 ENCOUNTER — Ambulatory Visit (INDEPENDENT_AMBULATORY_CARE_PROVIDER_SITE_OTHER): Payer: 59 | Admitting: Neurology

## 2014-10-24 ENCOUNTER — Encounter: Payer: Self-pay | Admitting: Neurology

## 2014-10-24 VITALS — BP 146/90 | HR 92 | Resp 16 | Wt 213.0 lb

## 2014-10-24 DIAGNOSIS — R5383 Other fatigue: Secondary | ICD-10-CM | POA: Diagnosis not present

## 2014-10-24 DIAGNOSIS — Z79899 Other long term (current) drug therapy: Secondary | ICD-10-CM | POA: Diagnosis not present

## 2014-10-24 DIAGNOSIS — G35 Multiple sclerosis: Secondary | ICD-10-CM

## 2014-10-24 DIAGNOSIS — H469 Unspecified optic neuritis: Secondary | ICD-10-CM

## 2014-10-24 NOTE — Progress Notes (Signed)
[facility]  PATIENT: Jill Hall DOB: [birth year]  REFERRING DOCTOR OR PCP:  Cammi Fulp SOURCE: patient  _________________________________   HISTORICAL  CHIEF COMPLAINT:  Chief Complaint  Patient presents with  . Multiple Sclerosis    Gilenya FDO.  0835--EKG done, shown to RAS, and approval received to give first dose. 4081--448/18-56-31.  Gilenya 0.5mg  po given.  0920--128/78-96-14.  Jill Hall is doing well, clipping coupons.  0950-132/80-88-14.  1020-134/80-88-14.  Jill Hall sts. she feels fine.  Skin warm, dry, pink.  1100-132/78-78-12.  1130--136/80-70.  Jill Hall is reading.  1200-118/76-70-16--eating lunch.  1225-124/72-74.  Clipping coupons.  1255--114/66-76-14.  1330--114/68-72-14.  1400--126/68-74.  1410--EKG done, given to RAS, received   . Multiple Sclerosis    approval to d/c pt. at 1440.  1440--126/80-72-12--Jill Hall sts. she is feeling fine.  Skin is warm, dry, pink.  She is d/c, is ambulatory out of office without difficulty, will make f/u appt. in 4 mos./fim    HISTORY OF PRESENT ILLNESS:  Jill Hall is a 54 year old woman who was diagnosed with multiple sclerosis in 1999 after presenting with optic neuritis MRI was consistent with MS and she was started on Betaseron. She did very well on Betaseron for many years.  She tolerated the medication well and had no significant skin issues. She was very compliant with the medication. For the past 15 years, she has had no definite exacerbation. However, on 06/09/2014, she underwent another MRI of the brain was abnormal showing an enhancing focus in the right brachium pontis as well as her known chronic MS plaques. I saw her back a few days later and we discussed options for treatment. She decided to go on Gilenya.  She has generally done well with her multiple sclerosis and has only mild symptoms from it. She denies any numbness or weakness and her gait is fine. She exercises 2 days a week and is active with her daughter.. She  denies any major MS related visual changes but will get mild visual blurring if she is very tired.. She notes no significant difficulties with bowel or bladder but occasionally will have some urinary urgency.  She has had some fatigue but it does not stop her from doing what she needs to do or wants to do. She generally sleeps well at night. She denies any significant cognitive problems and denies any depression or anxiety.  She was seen multiple times today over the 6 hours after the initial evaluation.   she was seen hourly by me. Vital signs were stable and she denied any lightheadedness, chest pain, nausea, or blurring or other symptoms. Her pre-and posttreatment EKGs were both normal. At no time did she bradycardia during her 6 hour evaluation.  REVIEW OF SYSTEMS: Constitutional: No fevers, chills, sweats, or change in appetite Eyes: No visual changes, double vision, eye pain Ear, nose and throat: No hearing loss, ear pain, nasal congestion, sore throat Cardiovascular: No chest pain, palpitations Respiratory: No shortness of breath at rest or with exertion.   No wheezes GastrointestinaI: No nausea, vomiting, diarrhea, abdominal pain, fecal incontinence Genitourinary: No dysuria, urinary retention or frequency.  No nocturia. Musculoskeletal: No neck pain, back pain Integumentary: No rash, pruritus, skin lesions Neurological: as above Psychiatric: No depression at this time.  No anxiety Endocrine: No palpitations, diaphoresis, change in appetite, change in weigh or increased thirst Hematologic/Lymphatic: No anemia, purpura, petechiae. Allergic/Immunologic: No itchy/runny eyes, nasal congestion, recent allergic reactions, rashes  ALLERGIES: Allergies  Allergen Reactions  . Pineapple Swelling  HOME MEDICATIONS:  Current outpatient prescriptions:  .  Cholecalciferol 2000 UNITS TABS, Take 1 tablet (2,000 Units total) by mouth daily., Disp: , Rfl:  .  Fingolimod HCl 0.5 MG CAPS,  Take 0.5 mg by mouth daily., Disp: , Rfl:  .  interferon beta-1b (BETASERON) 0.3 MG injection, Inject 0.25 mg into the skin every other day. , Disp: , Rfl:  .  Multiple Vitamin (MULTIVITAMIN WITH MINERALS) TABS, Take 1 tablet by mouth daily., Disp: , Rfl:  .  norethindrone-ethinyl estradiol (JUNEL FE,GILDESS FE,LOESTRIN FE) 1-20 MG-MCG tablet, Take 1 tablet by mouth daily., Disp: , Rfl:   PAST MEDICAL HISTORY: Past Medical History  Diagnosis Date  . Multiple sclerosis   . Neuromuscular disorder     Multiple Sclerosis- affects her vision only    PAST SURGICAL HISTORY: Past Surgical History  Procedure Laterality Date  . Cesarean section    . Uterine fibroid surgery      FAMILY HISTORY: History reviewed. No pertinent family history.  SOCIAL HISTORY:  History   Social History  . Marital Status: Married    Spouse Name: N/A  . Number of Children: N/A  . Years of Education: N/A   Occupational History  . Not on file.   Social History Main Topics  . Smoking status: Never Smoker   . Smokeless tobacco: Never Used  . Alcohol Use: No  . Drug Use: No  . Sexual Activity: Not on file   Other Topics Concern  . Not on file   Social History Narrative     PHYSICAL EXAM  Filed Vitals:   10/24/14 0854  BP: 146/90  Pulse: 92  Resp: 16  Weight: 213 lb (96.616 kg)    Body mass index is 34.4 kg/(m^2).   General: The patient is well-developed and well-nourished and in no acute distress  Eyes:  Funduscopic exam shows normal optic discs and retinal vessels.  Neck: The neck is supple, no carotid bruits are noted.  The neck is nontender.  Cardiovascular: The heart has a regular rate and rhythm with a normal S1 and S2. There were no murmurs, gallops or rubs. Lungs are clear to auscultation.  Skin: Extremities are without significant edema.  Musculoskeletal:  Back is nontender  Neurologic Exam  Mental status: The patient is alert and oriented x 3 at the time of the  examination. The patient has apparent normal recent and remote memory, with an apparently normal attention span and concentration ability.   Speech is normal.  Cranial nerves: Extraocular movements are full. Pupils are equal, round, and reactive to light and accomodation.  Visual fields are full.  Facial symmetry is present. There is good facial sensation to soft touch bilaterally.Facial strength is normal.  Trapezius and sternocleidomastoid strength is normal. No dysarthria is noted.  The tongue is midline, and the patient has symmetric elevation of the soft palate. No obvious hearing deficits are noted.  Motor:  Muscle bulk is normal.   Tone is normal. Strength is  5 / 5 in all 4 extremities.   Sensory: Sensory testing is intact to pinprick, soft touch and vibration sensation in all 4 extremities.  Coordination: Cerebellar testing reveals good finger-nose-finger and heel-to-shin bilaterally.  Gait and station: Station is normal.   Gait is normal. Tandem gait is mildly wide. Romberg is negative.   Reflexes: Deep tendon reflexes are symmetric and normal bilaterally.   Plantar responses are flexor.    DIAGNOSTIC DATA (LABS, IMAGING, TESTING) - I reviewed patient records, labs, notes,  testing and imaging myself where available.  Lab Results  Component Value Date   WBC 9.7 04/15/2013   HGB 12.9 04/15/2013   HCT 37.1 04/15/2013   MCV 81.7 04/15/2013   PLT 309 04/15/2013      Component Value Date/Time   NA 139 04/15/2013 0212   K 3.5 04/15/2013 0212   CL 102 04/15/2013 0212   CO2 23 04/15/2013 0212   GLUCOSE 101* 04/15/2013 0212   BUN 8 04/15/2013 0212   CREATININE 0.60 04/15/2013 0212   CALCIUM 9.3 04/15/2013 0212   PROT 8.0 04/15/2013 0212   ALBUMIN 3.4* 04/15/2013 0212   AST 20 04/15/2013 0212   ALT 12 04/15/2013 0212   ALKPHOS 63 04/15/2013 0212   BILITOT 0.2* 04/15/2013 0212   GFRNONAA >90 04/15/2013 0212   GFRAA >90 04/15/2013 0212   Other labs, MRI reports, office note  from [facility] neurology were reviewed. Additionally, I personally reviewed the MRI images from October 2015.   ASSESSMENT AND PLAN  Multiple sclerosis  High risk medication use  Optic neuritis  Other fatigue    In summary, Jayley Hustead is a 54 year old woman with relapsing remitting multiple sclerosis who had breakthrough disease evident on MRI well-being very compliant on Betaseron. Previous to this, she had been stable for many years. We had gone over options --  stay on Betaseron or switch to a different medication. She wished to switch to Gilenya. Laboratory tests, ophthalmologic exam and baseline EKG were all fine. She comes in today for her first observation. The premedication EKG was normal. She took her medication and was followed over the next 6 hours. She remained stable and did not develop bradycardia. Her posttreatment EKG was also normal area and  She will continue on Gilenya and get and another eye exam again in about 3 months. I will see her back about that time as well. She is to call sooner if she has any new or worsening neurologic symptoms.  After the initial history and physical, she was seen on multiple occasions for an additional 40 minutes.   Vital signs were normal. She tolerated Gilenya well and denied any lightheadedness, headache, palpitations, shortness of breath or other symptoms. Before discharge I discussed with her the importance of getting the follow-up eye exam and following up with [place] for further laboratory tests in about 3 months.  Richard A. Felecia Shelling, MD, PhD 1/68/3729, 0:21 PM Certified in Neurology, Clinical Neurophysiology, Sleep Medicine, Pain Medicine and Neuroimaging  [facility] [address] [place], [place] [address] [phone]

## 2014-11-01 ENCOUNTER — Telehealth: Payer: Self-pay | Admitting: Neurology

## 2014-11-01 NOTE — Telephone Encounter (Signed)
Pt is calling stating she needs a Rx for Gilenya, she has questions regarding this needs instructions.  Please call and advise.

## 2014-11-01 NOTE — Telephone Encounter (Signed)
Spoke with Jill Hall.  She sts. she has not heard form [employer] regarding Gilenya.  Only has enough Gilenya to last until Sunday.  I called [employer] and spoke with Kendra--learned that Olegario Shearer is no longer our Metallurgist, so she may not  have gotten my message last week that Jill Hall had completed her fdo without event.  [employer] will send Jill Hall an extra week's supply of Gilenya.  She should expect delivery on Thursday 11-03-14.  She should also expect a call from the new Gilenya nurse--Tim--in the next couple of business days.  [employer] will release Jill Hall's Gilenya rx. to [facility]--they will also contact Jill Hall in the next couple of business days.  If, for some reason Jill Hall doesn't get a call from [facility], she should contact them at [phone].  Jill Hall verbalized understanding of all of above/fim

## 2014-11-02 ENCOUNTER — Other Ambulatory Visit: Payer: Self-pay

## 2014-11-02 DIAGNOSIS — Z1231 Encounter for screening mammogram for malignant neoplasm of breast: Secondary | ICD-10-CM

## 2014-11-03 ENCOUNTER — Encounter: Payer: Self-pay | Admitting: *Deleted

## 2014-11-15 ENCOUNTER — Ambulatory Visit
Admission: RE | Admit: 2014-11-15 | Discharge: 2014-11-15 | Disposition: A | Discharge status: Home / Self Care | Payer: 59 | Source: Ambulatory Visit

## 2014-11-15 DIAGNOSIS — Z1231 Encounter for screening mammogram for malignant neoplasm of breast: Secondary | ICD-10-CM

## 2014-11-17 ENCOUNTER — Other Ambulatory Visit: Payer: Self-pay | Admitting: Obstetrics & Gynecology

## 2014-11-17 DIAGNOSIS — R928 Other abnormal and inconclusive findings on diagnostic imaging of breast: Secondary | ICD-10-CM

## 2014-11-21 ENCOUNTER — Ambulatory Visit
Admission: RE | Admit: 2014-11-21 | Discharge: 2014-11-21 | Disposition: A | Discharge status: Home / Self Care | Payer: 59 | Source: Ambulatory Visit | Attending: Obstetrics & Gynecology | Admitting: Obstetrics & Gynecology

## 2014-11-21 DIAGNOSIS — R928 Other abnormal and inconclusive findings on diagnostic imaging of breast: Secondary | ICD-10-CM

## 2014-11-23 ENCOUNTER — Telehealth: Payer: Self-pay | Admitting: *Deleted

## 2014-11-23 NOTE — Telephone Encounter (Signed)
Spoke with Jill Hall who sts. Gilenya rx. was not sent to [facility] as it should have been.  I had received a 11-03-14 fax from [place] [place] [facility] stating [place] rx. had been triaged to [facility].  I spoke with Katherine--she sts. there is a note in Jill Hall's chart stating rx. should go to [facility] isn't sure why it wasn't sent there.  She thought the issue was that Jill Hall had new ins., but I confirmed ins. is not new and have faxed a copy of her card to [facility] attn: R. Rosana Berger, per Katherine's instructions.  I spoke with Jill Hall again and advised [employer] is working on t his/fim

## 2014-11-23 NOTE — Telephone Encounter (Signed)
Patient is calling stating that her new Rx (Gilenya) was [facility]. Patient would like a call back

## 2014-11-25 NOTE — Telephone Encounter (Signed)
Spoke with Tiffany at [facility] to confirm they received my fax (pt's ins. card) yesterday, and that problem with the specialty pharmacy has been rectified.  She sts. it has not yet--that they have to call ins. company to confirm specialty pharmacy--Tiffany sts. she will do this today/fim

## 2014-11-28 ENCOUNTER — Other Ambulatory Visit: Payer: Self-pay | Admitting: *Deleted

## 2014-12-05 NOTE — Telephone Encounter (Signed)
I have not heard from Jill Hall to confirm that confusion over which specialty pharmacy Jill Hall should be using has been resolved.  I spoke with Jill Hall at [facility] to get an update.  She sts. Jill Hall's info is till showing the [facility] is her specialty pharmacy.  I advised her specialty pharmacy is actually [facility].  I faxed them Jill Hall's ins. card on 11-25-14.  Jill Hall sts. a 2nd benefits investigation to confirm specialty pharmacy is still in progress.  I called Jill Hall and gave update.  [name] will continue to send her 2 week supplies of free Gilenya until this issue is resolved.  Jill Hall verbalized understanding of same/fim

## 2014-12-07 ENCOUNTER — Telehealth: Payer: Self-pay | Admitting: Neurology

## 2014-12-07 NOTE — Telephone Encounter (Signed)
LMOM for Vicky to call.  I just spoke with [facility] this week to check on Pam's Gilenya and was told a new 2 week supply was just sent to her/fim

## 2014-12-07 NOTE — Telephone Encounter (Signed)
Spoke with Vicky--our Gilenya liason--who sts. she needs me to call in a rx. to [facility], for a 2 wk. temporary supply of Gilenya.  They still do not have specialty pharmacy issues worked out.  I did call [facility] and gave a verbal rx. for Gilenya 0.5mg  po daily #14 with 3 r/f/fim

## 2014-12-07 NOTE — Telephone Encounter (Signed)
duplicate task.  see telephone note dated 11-23-14/fim

## 2014-12-07 NOTE — Telephone Encounter (Signed)
Vicky the nurse from [facility] is calling regarding the patient. Olegario Shearer states she needs a verbal order by 3:00pm today for Gilenya to prevent the patient from running out of medication. Please call. Thank you.

## 2014-12-13 ENCOUNTER — Telehealth: Payer: Self-pay | Admitting: Neurology

## 2014-12-13 ENCOUNTER — Telehealth: Payer: Self-pay

## 2014-12-13 NOTE — Telephone Encounter (Signed)
Jill Hall is calling stating she needed to give you some information regarding [facility] and has insurance questions.  Please call and advise.

## 2014-12-13 NOTE — Telephone Encounter (Signed)
Duplicate task/fim

## 2014-12-13 NOTE — Telephone Encounter (Signed)
Spoke  with Vicky Geologist, engineering) who sts. they are still having difficulty getting Gilenya to the correct specialty pharmacy.  Sts. when they try to send rx. to [facility], they are told that pharmacy has not existed since 2013.  Sts. they are told [facility] is her specialty pharmacy, but [facility] ? wont accept rx. either.  She will continue to work on this.  I called Pam and gave her this update.  She will call HR and see if there is anything they can do to help resolve this problem/fim

## 2014-12-13 NOTE — Telephone Encounter (Signed)
Vicky from [facility] called me regarding this patient.  She explained there has been some confusion with the specialty pharmacy.  Told her I will gladly reach out to [facility] and see what we can do to help.  I contacted Ryanne at [facility] ([phone]).  She is an Passenger transport manager.  She is going to look into the situation and call me back with an update so we know what steps need to be taken next.

## 2014-12-13 NOTE — Telephone Encounter (Signed)
Vicky from [facility] called stating that she was speaking with Faith Misenheimer, regarding patient's insurance. Please call Vicky back # [phone]

## 2014-12-13 NOTE — Telephone Encounter (Signed)
Spoke with Olegario Shearer again who sts. she has reached out to Kennard, who will speak with a contact at [facility].  Right now, Carley Hammed is telling [employer] that they are not contracted with [facility], even tho Pam's ins. card says [facility].  So, not sure if [facility] is the correct specialty pharmacy, or if ins. card incorrectly lists [facility] as the specialty pharmacy/fim

## 2014-12-13 NOTE — Telephone Encounter (Signed)
duplicate task/fim 

## 2014-12-16 NOTE — Telephone Encounter (Signed)
I contacted Ryanne to follow up.  She said her supervisor is still looking in to this matter, and she will contact me as soon as they have a resolution.

## 2014-12-19 NOTE — Telephone Encounter (Signed)
Ryanne said they had to transfer the Rx to local pharmacy because they were not able to get an override to fill there.  I called [facility] and spoke with Caryl Pina.  She said the Rx was sent to [facility]  I called them, spoke with [facility].  She said they did get the Rx, however, the patient's co-pay is $200 per fill.  I called the patient.  Explained Rx was forwarded to Pharm in [place].  Patient said she think she was approved for Patient Assist to get medication at no cost.  I told her I will check with [facility] regarding this.  Also says if she has to get it at a local pharm, she wants to try and get it transferred to [facility].  She would like to wait on transfer until co-pay/PAP info is verified.  I called Vicky with [facility].  Got no answer.  Left message relaying all of the info above.  Asked that she please call back to clarify.

## 2014-12-20 NOTE — Telephone Encounter (Signed)
I spoke with Vicky.  She does not think patient is on PAP, but should be eligible for co-pay assist.  She is going to check into this and call me back.

## 2014-12-22 NOTE — Telephone Encounter (Signed)
I spoke with Octavia Bruckner from [facility] [phone] opt [phone] ext [phone]).  He said they have contacted [facility], and the patient should be able to get this Rx at [facility] and $0 co-pay.  He asked that I call [facility] back and ask them to call [facility] for an override.  I contacted Ryanne at [facility].  Explained the situation.  She is looking into this and will notify me once they have spoken with ins.

## 2015-01-02 ENCOUNTER — Other Ambulatory Visit (HOSPITAL_COMMUNITY): Payer: Self-pay | Admitting: Obstetrics & Gynecology

## 2015-01-02 NOTE — Telephone Encounter (Signed)
LMOM for Jill Hall ([facility] pt. services liason) to call with update on specialty pharmacy/fim

## 2015-01-02 NOTE — Telephone Encounter (Signed)
Jill Hall with [facility] confirmed they will be able to fill Rx via mail order, however, they are limited to dispensing only one month per fill.  I called Tim at [facility] [phone] opt [phone]).  Relayed this info.  He said he will follow up with the pharm, and re-triage the Rx if needed.  He will also reach out to the patient.  He will call me back if anything further is needed from [place].  I called the patient on cell.  Relayed this info.  She verbalized understanding and will wait to hear from Perry and Emlyn.  I called Vicky back as well to provide update.  Got no answer.  Left message.

## 2015-01-03 LAB — CYTOLOGY - PAP

## 2015-01-03 NOTE — Telephone Encounter (Signed)
I have received a fax from [place] this am stating that the benefits investigation for Pam's Gilenya has been completed, that rx. will be triaged to [facility] ([phone])/fim

## 2015-01-05 NOTE — Telephone Encounter (Signed)
I spoke with Vicky from [facility].  She verified [facility] Bruckner was able to triage Rx to [facility], and they Carley Hammed) are scheduling delivery with patient.  Indicates nothing further is needed from [place].  She also verified the patient has not missed any doses of medication, as they have been sending her comp doses.

## 2015-01-10 NOTE — Telephone Encounter (Signed)
LMOM for Pam to call--I just want to make sure she is still getting Gil[facility]ya, see if issues with specialty pharmacy have been resolved/fim

## 2015-01-16 NOTE — Telephone Encounter (Signed)
I believe issues with specialty pharmacy have been resolved.  Will address further if Pam contacts me to let me know she is still having problems/fim

## 2015-02-23 ENCOUNTER — Encounter: Payer: Self-pay | Admitting: Neurology

## 2015-02-23 ENCOUNTER — Ambulatory Visit (INDEPENDENT_AMBULATORY_CARE_PROVIDER_SITE_OTHER): Payer: 59 | Admitting: Neurology

## 2015-02-23 VITALS — BP 138/84 | Ht 66.0 in | Wt 216.0 lb

## 2015-02-23 DIAGNOSIS — R5383 Other fatigue: Secondary | ICD-10-CM

## 2015-02-23 DIAGNOSIS — H469 Unspecified optic neuritis: Secondary | ICD-10-CM

## 2015-02-23 DIAGNOSIS — G35 Multiple sclerosis: Secondary | ICD-10-CM

## 2015-02-23 DIAGNOSIS — Z79899 Other long term (current) drug therapy: Secondary | ICD-10-CM | POA: Diagnosis not present

## 2015-02-23 NOTE — Progress Notes (Signed)
[facility]  PATIENT: Jill Hall DOB: 1961/03/16  REFERRING DOCTOR OR PCP:  Cammi Fulp SOURCE: patient  _________________________________   HISTORICAL  CHIEF COMPLAINT:  Chief Complaint  Patient presents with  . Multiple Sclerosis    Sts. she continues to tolerate Gilenya well.  Sts. in May, labs at her ob/gyn office showed she is mildly anemic, so she is now taking otc iron supplement.  Sts. no other changes.Hilton Cork    HISTORY OF PRESENT ILLNESS:  Jill Hall is a 54 year old woman with multiple sclerosis in 1999.     She reports she was recently diagnosed with mild anemia and she is taking iron suppleme  MS History:   She ws diagnosed with MS in 1999 after presenting with optic neuritis.  MRI was consistent with MS and she was started on Betaseron. She did very well on Betaseron for many years.  She tolerated the medication well and had no significant skin issues. She was very compliant with the medication. For the past 15 years, she has had no definite exacerbation. However, on 06/09/2014, she underwent another MRI of the brain and it was  abnormal showing an enhancing focus in the right brachium pontis as well as her known chronic MS plaques. She started Gilenya in February.   She is tolerating it very well.     Strength/sensation/gait:   She denies any numbness or weakness and her gait is fine. She exercises 2 days a week and is active with her daughter..   Vision:   She denies any major MS related visual changes but will get mild visual blurring if she is very tired..   Bladder:   She notes no significant difficulties with bladder but occasionally will have some urinary urgency.  Bowel is fine.    Fatigue/sleep:    She has had only a little fatigue and it does not stop her from doing what she needs to do.   She denies much heat intolerance.   She generally sleeps well at night.   Cognitive/mood:  She denies any significant cognitive problems.  She denies  any depression or anxiety.  REVIEW OF SYSTEMS: Constitutional: No fevers, chills, sweats, or change in appetite Eyes: No visual changes, double vision, eye pain Ear, nose and throat: No hearing loss, ear pain, nasal congestion, sore throat Cardiovascular: No chest pain, palpitations Respiratory: No shortness of breath at rest or with exertion.   No wheezes GastrointestinaI: No nausea, vomiting, diarrhea, abdominal pain, fecal incontinence Genitourinary: No dysuria, urinary retention or frequency.  No nocturia. Musculoskeletal: No neck pain, back pain Integumentary: No rash, pruritus, skin lesions Neurological: as above Psychiatric: No depression at this time.  No anxiety Endocrine: No palpitations, diaphoresis, change in appetite, change in weigh or increased thirst Hematologic/Lymphatic: No anemia, purpura, petechiae. Allergic/Immunologic: No itchy/runny eyes, nasal congestion, recent allergic reactions, rashes  ALLERGIES: Allergies  Allergen Reactions  . Pineapple Swelling    HOME MEDICATIONS:  Current outpatient prescriptions:  .  Cholecalciferol 2000 UNITS TABS, Take 1 tablet (2,000 Units total) by mouth daily., Disp: , Rfl:  .  ferrous sulfate 325 (65 FE) MG tablet, Take 325 mg by mouth daily with breakfast., Disp: , Rfl:  .  Fingolimod HCl 0.5 MG CAPS, Take 0.5 mg by mouth daily., Disp: , Rfl:  .  Multiple Vitamin (MULTIVITAMIN WITH MINERALS) TABS, Take 1 tablet by mouth daily., Disp: , Rfl:  .  norethindrone-ethinyl estradiol (JUNEL FE,GILDESS FE,LOESTRIN FE) 1-20 MG-MCG tablet, Take 1 tablet by mouth  daily., Disp: , Rfl:   PAST MEDICAL HISTORY: Past Medical History  Diagnosis Date  . Multiple sclerosis   . Neuromuscular disorder     Multiple Sclerosis- affects her vision only    PAST SURGICAL HISTORY: Past Surgical History  Procedure Laterality Date  . Cesarean section    . Uterine fibroid surgery      FAMILY HISTORY: History reviewed. No pertinent family  history.  SOCIAL HISTORY:  History   Social History  . Marital Status: Married    Spouse Name: N/A  . Number of Children: N/A  . Years of Education: N/A   Occupational History  . Not on file.   Social History Main Topics  . Smoking status: Never Smoker   . Smokeless tobacco: Never Used  . Alcohol Use: No  . Drug Use: No  . Sexual Activity: Not on file   Other Topics Concern  . Not on file   Social History Narrative     PHYSICAL EXAM  Filed Vitals:   02/23/15 1146  BP: 138/84  Height: 5\' 6"  (1.676 m)  Weight: 216 lb (97.977 kg)    Body mass index is 34.88 kg/(m^2).   General: The patient is well-developed and well-nourished and in no acute distress   Neurologic Exam  Mental status: The patient is alert and oriented x 3 at the time of the examination. The patient has apparent normal recent and remote memory, with an apparently normal attention span and concentration ability.   Speech is normal.  Cranial nerves: Extraocular movements are full.    Facial symmetry is present. There is good facial sensation to soft touch bilaterally.Facial strength is normal.  Trapezius and sternocleidomastoid strength is normal. No dysarthria is noted.  The tongue is midline, and the patient has symmetric elevation of the soft palate.    Motor:  Muscle bulk is normal.   Tone is normal. Strength is  5 / 5 in all 4 extremities.   Sensory: Sensory testing is intact to  soft touch and vibration sensation in all 4 extremities.  Coordination: Cerebellar testing reveals good finger-nose-finger bilaterally.  Gait and station: Station is normal.   Gait is normal. Tandem gait is mildly wide. Romberg is negative.   Reflexes: Deep tendon reflexes are symmetric and normal bilaterally.       DIAGNOSTIC DATA (LABS, IMAGING, TESTING) - I reviewed patient records, labs, notes, testing and imaging myself where available.  Lab Results  Component Value Date   WBC 9.7 04/15/2013   HGB 12.9  04/15/2013   HCT 37.1 04/15/2013   MCV 81.7 04/15/2013   PLT 309 04/15/2013      Component Value Date/Time   NA 139 04/15/2013 0212   K 3.5 04/15/2013 0212   CL 102 04/15/2013 0212   CO2 23 04/15/2013 0212   GLUCOSE 101* 04/15/2013 0212   BUN 8 04/15/2013 0212   CREATININE 0.60 04/15/2013 0212   CALCIUM 9.3 04/15/2013 0212   PROT 8.0 04/15/2013 0212   ALBUMIN 3.4* 04/15/2013 0212   AST 20 04/15/2013 0212   ALT 12 04/15/2013 0212   ALKPHOS 63 04/15/2013 0212   BILITOT 0.2* 04/15/2013 0212   GFRNONAA >90 04/15/2013 0212   GFRAA >90 04/15/2013 0212   Other labs, MRI reports, office note from [facility] neurology were reviewed. Additionally, I personally reviewed the MRI images from October 2015.   ASSESSMENT AND PLAN  Multiple sclerosis - Plan: CBC with Differential/Platelet, Hepatic function panel  High risk medication use - Plan: CBC  with Differential/Platelet, Hepatic function panel  Other fatigue  Optic neuritis   1.   Continue Gilenya. We will check CBC with differential and hepatic function test today. 2.   She should continue to stay active and exercises as tolerated. 3.   Return in about 5 months or sooner if there are new or worsening neurologic symptoms. We will need to check an MRI time of her next visit to rule out subclinical progression that will make [place] consider a change in therapy.   Richard A. Felecia Shelling, MD, PhD 5/97/4718, 55:01 AM Certified in Neurology, Clinical Neurophysiology, Sleep Medicine, Pain Medicine and Neuroimaging  [facility] [address] [place], [place] [address] [phone]

## 2015-02-28 ENCOUNTER — Other Ambulatory Visit (INDEPENDENT_AMBULATORY_CARE_PROVIDER_SITE_OTHER): Payer: Self-pay

## 2015-02-28 DIAGNOSIS — Z79899 Other long term (current) drug therapy: Secondary | ICD-10-CM

## 2015-02-28 DIAGNOSIS — G35 Multiple sclerosis: Secondary | ICD-10-CM

## 2015-02-28 DIAGNOSIS — Z0289 Encounter for other administrative examinations: Secondary | ICD-10-CM

## 2015-03-01 ENCOUNTER — Telehealth: Payer: Self-pay | Admitting: *Deleted

## 2015-03-01 LAB — HEPATIC FUNCTION PANEL
ALBUMIN: 4 g/dL (ref 3.5–5.5)
ALT: 20 IU/L (ref 0–32)
AST: 20 IU/L (ref 0–40)
Alkaline Phosphatase: 75 IU/L (ref 39–117)
Bilirubin Total: 0.3 mg/dL (ref 0.0–1.2)
Bilirubin, Direct: 0.08 mg/dL (ref 0.00–0.40)
TOTAL PROTEIN: 7.2 g/dL (ref 6.0–8.5)

## 2015-03-01 LAB — CBC WITH DIFFERENTIAL/PLATELET
BASOS: 0 %
Basophils Absolute: 0 10*3/uL (ref 0.0–0.2)
EOS (ABSOLUTE): 0.3 10*3/uL (ref 0.0–0.4)
EOS: 7 %
Hematocrit: 38.4 % (ref 34.0–46.6)
Hemoglobin: 12.5 g/dL (ref 11.1–15.9)
IMMATURE GRANS (ABS): 0 10*3/uL (ref 0.0–0.1)
IMMATURE GRANULOCYTES: 0 %
Lymphocytes Absolute: 0.3 10*3/uL — ABNORMAL LOW (ref 0.7–3.1)
Lymphs: 7 %
MCH: 27.6 pg (ref 26.6–33.0)
MCHC: 32.6 g/dL (ref 31.5–35.7)
MCV: 85 fL (ref 79–97)
MONOCYTES: 15 %
Monocytes Absolute: 0.6 10*3/uL (ref 0.1–0.9)
NEUTROS ABS: 2.8 10*3/uL (ref 1.4–7.0)
NEUTROS PCT: 71 %
PLATELETS: 289 10*3/uL (ref 150–379)
RBC: 4.53 x10E6/uL (ref 3.77–5.28)
RDW: 15.7 % — ABNORMAL HIGH (ref 12.3–15.4)
WBC: 3.9 10*3/uL (ref 3.4–10.8)

## 2015-03-01 NOTE — Telephone Encounter (Signed)
Left message with female that answered the phone (home #) for Jill Hall to call.  No details left.  I tried to reach her on her mobile # but received message that vm has not been set up/fim

## 2015-03-01 NOTE — Telephone Encounter (Signed)
I have spoken with Pam this morning and per RAS, have advised that labs are ok--lymphocytes are a little low, but this is to be expected while on Gilenya.  She verbalized understanding of same/fim

## 2015-03-01 NOTE — Telephone Encounter (Signed)
Patient returned call and requested Faith RN call her back on [phone]) or [phone]). Please call and advise.

## 2015-03-01 NOTE — Telephone Encounter (Signed)
-----   Message from Britt Bottom, MD sent at 03/01/2015  9:15 AM EDT ----- Labs look good (low lymphocytes expected with Gilenya)

## 2015-04-21 ENCOUNTER — Telehealth: Payer: Self-pay | Admitting: Neurology

## 2015-04-21 MED ORDER — FINGOLIMOD HCL 0.5 MG PO CAPS: 0.5000 mg | ORAL_CAPSULE | Freq: Every day | ORAL | Status: DC

## 2015-04-21 NOTE — Telephone Encounter (Signed)
Thank you for this info.  Rx has been resent to [facility].

## 2015-04-21 NOTE — Telephone Encounter (Signed)
[facility] [phone] called to advise they are unable to fill Rx for Gilenya. It needs to go through [facility] [employer] [phone]. Also advised medication runs out on Wednesday 04/26/15.

## 2015-07-24 ENCOUNTER — Telehealth: Payer: Self-pay | Admitting: Neurology

## 2015-07-24 NOTE — Telephone Encounter (Signed)
Patient called to request refill of Gilenya, there was a manufacturer change from [employer] to another company that she doesn't know. Patient states she's been trying to get this straightened out since 07/17/15, now only has 3 days left and in order to fill, [facility] needs permission to send 2 weeks supply until they get everything straightened out, [facility] needs Rx faxed to them.

## 2015-07-24 NOTE — Telephone Encounter (Signed)
I have spoken with Levada Dy at [facility] and given v/o for Gilenya 0.5mg  one po daily #14 with 3 r/f prn./fim

## 2015-07-25 NOTE — Telephone Encounter (Signed)
I called the pharmacy back and spoke with Pam.  She verified there is not an issue with ins, as it pays the majority of cost, with the co-pay being $200.  Stated they split bill the prescription, and her co-pay assist card picks up the remainder of co-pay.  She said they were told by [facility] that the vendor changed, therefore the co-pay card they are billing is no longer valid.  Stated they could not provide new vendor info, but says the patient will need to get a new co-pay card from current vendor to proceed.   I spoke with Jocelyn Lamer, she is going to speak with [facility] to see if they can provide any additional info.

## 2015-07-25 NOTE — Telephone Encounter (Signed)
I have spoken with Jill Hall is unsure what the problem is--she will  look into this, reach out to [facility] and possibly a colleague to see if they can resolve this.  I will forward this to Janett Billow so will be aware to expect Vicki's call/fim

## 2015-07-25 NOTE — Telephone Encounter (Signed)
I have spoken with Jill Hall this morning and let her know I called rx. for 2 week supply of samples and 3 prn r/f  to [facility] yesterday. She verbalized understanding of same, sts. she is having trouble getting her med from [facility].  She receives copay assistance from [employer], and so has not had a copay since starting Gilenya, but now sts. the pharmacy told her she has a $200 per month copay.  She is not clear on what the problem is.  I have spoken with Berna Spare at [facility] [phone]), and she sts. the problem is that [facility] is using a new processor for their copay cards, and it is not working; they are having trouble submitting charges to Jill Hall's copay card.  She sts. a pharmacist there, Jill Hall, spent much time yesterday trying to resolve this, without success.  I have left a message for our [employer] [facility] pt. services liaison, Heide Guile, to call me and will try and get this resolved for Jill Hall.  In the meantime, I have also placed a one week supply of Gilenya up front for Jill Hall to pick up tomorrow/fim

## 2015-07-26 ENCOUNTER — Encounter: Payer: Self-pay | Admitting: Neurology

## 2015-07-26 ENCOUNTER — Ambulatory Visit (INDEPENDENT_AMBULATORY_CARE_PROVIDER_SITE_OTHER): Payer: 59 | Admitting: Neurology

## 2015-07-26 VITALS — BP 152/90 | HR 74 | Resp 16 | Ht 66.0 in | Wt 225.0 lb

## 2015-07-26 DIAGNOSIS — G35 Multiple sclerosis: Secondary | ICD-10-CM | POA: Diagnosis not present

## 2015-07-26 DIAGNOSIS — Z79899 Other long term (current) drug therapy: Secondary | ICD-10-CM | POA: Diagnosis not present

## 2015-07-26 DIAGNOSIS — H469 Unspecified optic neuritis: Secondary | ICD-10-CM | POA: Diagnosis not present

## 2015-07-26 DIAGNOSIS — R5383 Other fatigue: Secondary | ICD-10-CM | POA: Diagnosis not present

## 2015-07-26 NOTE — Progress Notes (Signed)
[facility]  PATIENT: Jill Hall DOB: [birth year]  REFERRING DOCTOR OR PCP:  Cammi Fulp SOURCE: patient  _________________________________   HISTORICAL  CHIEF COMPLAINT:  Chief Complaint  Patient presents with  . Multiple Sclerosis    Sts. she continues to tolerate Gilenya well.  Denies new or worsening sx/fim    HISTORY OF PRESENT ILLNESS:  Jill Hall is a 54 year old woman with multiple sclerosis since 1999.     She started Gilenya in February 2016. She tolerates it well and denies any occult disease with GI symptoms or other symptoms. She has not had any visual changes. We switched her from Betaseron as she had evidence of new activity on MRI in late 2015, despite having many years of stable control of the MS.  Strength/sensation/gait:   She denies any numbness or weakness and her gait is fine. She exercises 2 days a week and is active with her daughter..   Vision:   She denies any major MS related visual changes.   First symptom was ON.   but will get mild visual blurring if she is very tired..   Bladder:   She notes no significant bladder dysfunction and just has some urinary urgency.  Bowel is fine.    Fatigue/sleep:    She has had only a little fatigue and it does not stop her from doing what she needs to do.   She denies heat intolerance.   She works full time.  She generally sleeps well at night.   Cognitive/mood:  She denies any significant cognitive problems.  She denies any depression or anxiety.   She works in [employer].       MS History:   She was diagnosed with MS in 1999 after presenting with optic neuritis.  MRI was consistent with MS and she was started on Betaseron. She did very well on Betaseron for many years.  She tolerated the medication well and had no significant skin issues. She was very compliant with the medication. For the past 15 years, she has had no definite exacerbation. However, on 06/09/2014, she underwent another MRI of  the brain and it was  abnormal showing an enhancing focus in the right brachium pontis as well as her known chronic MS plaques. She started Gilenya in February.    REVIEW OF SYSTEMS: Constitutional: No fevers, chills, sweats, or change in appetite.  Mild fatigue Eyes: No visual changes, double vision, eye pain Ear, nose and throat: No hearing loss, ear pain, nasal congestion, sore throat Cardiovascular: No chest pain, palpitations Respiratory: No shortness of breath at rest or with exertion.   No wheezes GastrointestinaI: No nausea, vomiting, diarrhea, abdominal pain, fecal incontinence Genitourinary: No dysuria, urinary retention or frequency.  No nocturia.   Mild urgency at times Musculoskeletal: No neck pain, back pain Integumentary: No rash, pruritus, skin lesions Neurological: as above Psychiatric: No depression at this time.  No anxiety Endocrine: No palpitations, diaphoresis, change in appetite, change in weigh or increased thirst Hematologic/Lymphatic: No anemia, purpura, petechiae. Allergic/Immunologic: No itchy/runny eyes, nasal congestion, recent allergic reactions, rashes  ALLERGIES: Allergies  Allergen Reactions  . Pineapple Swelling    HOME MEDICATIONS:  Current outpatient prescriptions:  .  Ascorbic Acid (VITAMIN C) 100 MG tablet, Take 100 mg by mouth daily., Disp: , Rfl:  .  Cholecalciferol 2000 UNITS TABS, Take 1 tablet (2,000 Units total) by mouth daily., Disp: , Rfl:  .  Fingolimod HCl 0.5 MG CAPS, Take 1 capsule (0.5 mg total)  by mouth daily., Disp: 30 capsule, Rfl: 6 .  Multiple Vitamin (MULTIVITAMIN WITH MINERALS) TABS, Take 1 tablet by mouth daily., Disp: , Rfl:  .  ferrous sulfate 325 (65 FE) MG tablet, Take 325 mg by mouth daily with breakfast., Disp: , Rfl:  .  norethindrone-ethinyl estradiol (JUNEL FE,GILDESS FE,LOESTRIN FE) 1-20 MG-MCG tablet, Take 1 tablet by mouth daily., Disp: , Rfl:   PAST MEDICAL HISTORY: Past Medical History  Diagnosis Date    . Multiple sclerosis ([place])   . Neuromuscular disorder ([place])     Multiple Sclerosis- affects her vision only    PAST SURGICAL HISTORY: Past Surgical History  Procedure Laterality Date  . Cesarean section    . Uterine fibroid surgery      FAMILY HISTORY: History reviewed. No pertinent family history.  SOCIAL HISTORY:  Social History   Social History  . Marital Status: Married    Spouse Name: N/A  . Number of Children: N/A  . Years of Education: N/A   Occupational History  . Not on file.   Social History Main Topics  . Smoking status: Never Smoker   . Smokeless tobacco: Never Used  . Alcohol Use: No  . Drug Use: No  . Sexual Activity: Not on file   Other Topics Concern  . Not on file   Social History Narrative     PHYSICAL EXAM  Filed Vitals:   07/26/15 0835  BP: 152/90  Pulse: 74  Resp: 16  Height: 5\' 6"  (1.676 m)  Weight: 225 lb (102.059 kg)    Body mass index is 36.33 kg/(m^2).   General: The patient is well-developed and well-nourished and in no acute distress   Neurologic Exam  Mental status: The patient is alert and oriented x 3 at the time of the examination. The patient has apparent normal recent and remote memory, with an apparently normal attention span and concentration ability.   Speech is normal.  Cranial nerves: Extraocular movements are full.  Color vision is symmetric.   Facial symmetry is present. There is good facial sensation to soft touch bilaterally.Facial strength is normal.  Trapezius and sternocleidomastoid strength is normal. No dysarthria is noted.  The tongue is midline, and the patient has symmetric elevation of the soft palate.    Motor:  Muscle bulk is normal.   Tone is normal. Strength is  5 / 5 in all 4 extremities.   Sensory: Sensory testing is intact to  soft touch and vibration sensation in all 4 extremities.  Coordination: Cerebellar testing reveals good finger-nose-finger bilaterally.  Gait and station: Station  is normal.   Gait is normal. Tandem gait is mildly wide. Romberg is negative.   Reflexes: Deep tendon reflexes are symmetric and normal bilaterally.       DIAGNOSTIC DATA (LABS, IMAGING, TESTING) - I reviewed patient records, labs, notes, testing and imaging myself where available.  Lab Results  Component Value Date   WBC 3.9 02/28/2015   HGB 12.9 04/15/2013   HCT 38.4 02/28/2015   MCV 81.7 04/15/2013   PLT 309 04/15/2013      Component Value Date/Time   NA 139 04/15/2013 0212   K 3.5 04/15/2013 0212   CL 102 04/15/2013 0212   CO2 23 04/15/2013 0212   GLUCOSE 101* 04/15/2013 0212   BUN 8 04/15/2013 0212   CREATININE 0.60 04/15/2013 0212   CALCIUM 9.3 04/15/2013 0212   PROT 7.2 02/28/2015 0848   PROT 8.0 04/15/2013 0212   ALBUMIN 4.0 02/28/2015 0848  ALBUMIN 3.4* 04/15/2013 0212   AST 20 02/28/2015 0848   ALT 20 02/28/2015 0848   ALKPHOS 75 02/28/2015 0848   BILITOT 0.3 02/28/2015 0848   BILITOT 0.2* 04/15/2013 0212   GFRNONAA >90 04/15/2013 0212   GFRAA >90 04/15/2013 0212   Other labs, MRI reports, office note from [facility] neurology were reviewed. Additionally, I personally reviewed the MRI images from October 2015.   ASSESSMENT AND PLAN  Multiple sclerosis ([place]) - Plan: MR Brain W Wo Contrast, CBC with Differential/Platelet, Hepatic function panel  Optic neuritis  Other fatigue  High risk medication use - Plan: CBC with Differential/Platelet, Hepatic function panel    1.   Continue Gilenya. Check CBC with differential and hepatic function test today. 2.   Check MRI of the brain to rule out subclinical progression. If this is occurring, we will need to consideranother medication change.    3.    She should continue to stay active and exercises as tolerated. 4.   Return in about 6 months or sooner if there are new or worsening neurologic symptoms.   Richard A. Felecia Shelling, MD, PhD [id number], [id number] AM Certified in Neurology, Clinical Neurophysiology, Sleep  Medicine, Pain Medicine and Neuroimaging  [facility] [address] [place], [place] [address] [phone]

## 2015-07-27 LAB — CBC WITH DIFFERENTIAL/PLATELET
BASOS ABS: 0 10*3/uL (ref 0.0–0.2)
Basos: 0 %
EOS (ABSOLUTE): 0.3 10*3/uL (ref 0.0–0.4)
Eos: 7 %
HEMOGLOBIN: 12.6 g/dL (ref 11.1–15.9)
Hematocrit: 37.6 % (ref 34.0–46.6)
Immature Grans (Abs): 0 10*3/uL (ref 0.0–0.1)
Immature Granulocytes: 0 %
LYMPHS ABS: 0.8 10*3/uL (ref 0.7–3.1)
Lymphs: 24 %
MCH: 27.8 pg (ref 26.6–33.0)
MCHC: 33.5 g/dL (ref 31.5–35.7)
MCV: 83 fL (ref 79–97)
MONOCYTES: 1 %
MONOS ABS: 0 10*3/uL — AB (ref 0.1–0.9)
NEUTROS ABS: 2.3 10*3/uL (ref 1.4–7.0)
Neutrophils: 68 %
Platelets: 282 10*3/uL (ref 150–379)
RBC: 4.54 x10E6/uL (ref 3.77–5.28)
RDW: 15.5 % — ABNORMAL HIGH (ref 12.3–15.4)
WBC: 3.4 10*3/uL (ref 3.4–10.8)

## 2015-07-27 LAB — HEPATIC FUNCTION PANEL
ALK PHOS: 100 IU/L (ref 39–117)
ALT: 21 IU/L (ref 0–32)
AST: 19 IU/L (ref 0–40)
Albumin: 4.1 g/dL (ref 3.5–5.5)
Bilirubin Total: 0.3 mg/dL (ref 0.0–1.2)
Bilirubin, Direct: 0.1 mg/dL (ref 0.00–0.40)
TOTAL PROTEIN: 7.2 g/dL (ref 6.0–8.5)

## 2015-07-27 NOTE — Telephone Encounter (Signed)
I have spoken with Jill Hall this morning and per RAS, advised labwork looks good.  She verbalized understanding of same/fim

## 2015-07-27 NOTE — Telephone Encounter (Signed)
-----   Message from Britt Bottom, MD sent at 07/27/2015  8:42 AM EST ----- Please let her know that the blood work looks good.   I think yesterday I asked you to get her MRI from [place].  Don't worry about that as I have access to her last MRI which was actually done in the [facility] system

## 2015-08-01 ENCOUNTER — Ambulatory Visit (HOSPITAL_COMMUNITY)
Admission: RE | Admit: 2015-08-01 | Discharge: 2015-08-01 | Disposition: A | Discharge status: Home / Self Care | Payer: 59 | Source: Ambulatory Visit | Attending: Neurology | Admitting: Neurology

## 2015-08-01 DIAGNOSIS — G35 Multiple sclerosis: Secondary | ICD-10-CM | POA: Diagnosis present

## 2015-08-01 MED ORDER — GADOBENATE DIMEGLUMINE 529 MG/ML IV SOLN
20.0000 mL | Freq: Once | INTRAVENOUS | Status: AC | PRN
  Administered 2015-08-01: 20 mL via INTRAVENOUS

## 2015-08-01 NOTE — Telephone Encounter (Signed)
[place] [phone] called requesting phone number for Gilenya rep, has tried to call 800-GILENYA  Transferred her to [phone] no one answered at this number.

## 2015-08-01 NOTE — Telephone Encounter (Signed)
Unfortunately, drug reps do not have access to specific patient info for privacy reasons.  There is a Marine scientist, Jill Hall, who we contact.  In addition, we also call the Gilenya Go Program at [phone]-GILENYA for info if needed. I called Jill Hall, but got no answer.  Left message with contact info for the pharmacy, in the event she is able to speak with them.  Advised if she is not, she can certainly call us back and we can relay any necessary info.    Jill Hall called back.  Said she will follow up with Jill Hall.  He is the dedicated team member for [place].  (800-GILENYA ext [phone]).  Verified they will still be providing the patient with comp meds while the situation is being looked into.   I called the pharmacy back and spoke with [facility].  Relayed info.  She expressed understanding and appreciation.

## 2015-08-02 ENCOUNTER — Telehealth: Payer: Self-pay | Admitting: *Deleted

## 2015-08-02 NOTE — Telephone Encounter (Signed)
Spoke to pt about MRI brain looking good for her MS. No new lesions, and the one that was newer last time looks better per Dr Felecia Shelling. Pt verbalized understanding.

## 2015-08-02 NOTE — Telephone Encounter (Signed)
Called and spoke to husband. He is going to have wife call back about results. She just left. Did not want to take message. Gave [facility] phone number.

## 2015-08-02 NOTE — Telephone Encounter (Signed)
Patient is returning a call. She can be reached at [phone].

## 2015-08-02 NOTE — Telephone Encounter (Signed)
-----   Message from Britt Bottom, MD sent at 08/02/2015  8:35 AM EST ----- Please let her know the MRI brain for her MS looks good No new lesions and the one that was newer last time looks better

## 2015-08-04 ENCOUNTER — Encounter: Payer: Self-pay | Admitting: *Deleted

## 2015-08-24 ENCOUNTER — Telehealth: Payer: Self-pay | Admitting: Neurology

## 2015-08-24 NOTE — Telephone Encounter (Signed)
Pt called said she has a cold. She take gilenya and is inquiring what medication she could take.

## 2015-08-24 NOTE — Telephone Encounter (Signed)
Rn call patient about taking cold medicine with her gilenya medication. Rn per Dr.Sater advice its okay to take any cold medication otc with the gilenya.Also Dr.Sater advise the patient if the cold is bad she needs to see her PCP. Pt verbalized understanding and stated it was not a bad cold.

## 2015-09-06 ENCOUNTER — Encounter: Payer: Self-pay | Admitting: *Deleted

## 2015-09-06 DIAGNOSIS — H52203 Unspecified astigmatism, bilateral: Secondary | ICD-10-CM | POA: Diagnosis not present

## 2015-09-07 MED FILL — GILENYA 0.5 MG CAPS: 0.5 | 30 days supply | Qty: 30 | Fill #1

## 2015-10-31 MED FILL — GILENYA 0.5 MG CAPS: 0.5 | 30 days supply | Qty: 30 | Fill #2

## 2015-11-02 ENCOUNTER — Other Ambulatory Visit: Payer: Self-pay

## 2015-11-02 DIAGNOSIS — Z1231 Encounter for screening mammogram for malignant neoplasm of breast: Secondary | ICD-10-CM

## 2015-11-23 ENCOUNTER — Ambulatory Visit
Admission: RE | Admit: 2015-11-23 | Discharge: 2015-11-23 | Disposition: A | Discharge status: Home / Self Care | Payer: 59 | Source: Ambulatory Visit

## 2015-11-23 DIAGNOSIS — Z1231 Encounter for screening mammogram for malignant neoplasm of breast: Secondary | ICD-10-CM | POA: Diagnosis not present

## 2015-12-04 MED FILL — GILENYA 0.5 MG CAPS: 0.5 | 30 days supply | Qty: 30 | Fill #3

## 2016-01-08 MED FILL — GILENYA 0.5 MG CAPS: 0.5 | 30 days supply | Qty: 30 | Fill #0

## 2016-01-15 DIAGNOSIS — Z6836 Body mass index (BMI) 36.0-36.9, adult: Secondary | ICD-10-CM | POA: Diagnosis not present

## 2016-01-15 DIAGNOSIS — Z01419 Encounter for gynecological examination (general) (routine) without abnormal findings: Secondary | ICD-10-CM | POA: Diagnosis not present

## 2016-01-29 ENCOUNTER — Ambulatory Visit (INDEPENDENT_AMBULATORY_CARE_PROVIDER_SITE_OTHER): Payer: 59 | Admitting: Neurology

## 2016-01-29 ENCOUNTER — Encounter: Payer: Self-pay | Admitting: Neurology

## 2016-01-29 VITALS — BP 146/76 | HR 72 | Resp 16 | Ht 63.0 in | Wt 226.5 lb

## 2016-01-29 DIAGNOSIS — H469 Unspecified optic neuritis: Secondary | ICD-10-CM | POA: Diagnosis not present

## 2016-01-29 DIAGNOSIS — G35 Multiple sclerosis: Secondary | ICD-10-CM | POA: Diagnosis not present

## 2016-01-29 DIAGNOSIS — Z79899 Other long term (current) drug therapy: Secondary | ICD-10-CM

## 2016-01-29 DIAGNOSIS — E559 Vitamin D deficiency, unspecified: Secondary | ICD-10-CM | POA: Diagnosis not present

## 2016-01-29 DIAGNOSIS — R5383 Other fatigue: Secondary | ICD-10-CM

## 2016-01-29 MED ORDER — ERGOCALCIFEROL 50 MCG (2000 UT) PO TABS: 2000.0000 [IU] | ORAL_TABLET | Freq: Every day | ORAL | Status: DC

## 2016-01-29 NOTE — Progress Notes (Signed)
[facility]  PATIENT: Jill Hall DOB: 07/19/1961  REFERRING DOCTOR OR PCP:  Cammi Fulp SOURCE: patient  _________________________________   HISTORICAL  CHIEF COMPLAINT:  Chief Complaint  Patient presents with  . Multiple Sclerosis    Sts. she continues to tolerate Gilenya well. Denies new or worsening sx./fim    HISTORY OF PRESENT ILLNESS:  Jill Hall is a 55 year old woman with multiple sclerosis since 1999.     She started Gilenya in February 2016. She tolerates it well and denies any occult disease with GI symptoms or other symptoms. She has not had any visual changes. We switched her from Betaseron as she had evidence of new activity on MRI in late 2015, despite having many years of stable control of the MS.  Strength/sensation/gait:   She denies any numbness or weakness and her gait is fine. She has no trouble with stairs.     Vision:   She denies any major MS related visual changes.   First symptom was ON.   but will get mild visual blurring if she is very tired..   Bladder:   She notes no significant bladder dysfunction and just has some urinary urgency.  Bowel is fine.    Fatigue/sleep:    She has had only a little fatigue and it does not stop her from doing what she needs to do.   She denies heat intolerance.   She works full time in Press photographer at [employer].  She generally sleeps well at night.   Cognitive/mood:  She denies any significant cognitive problems.  She denies any depression or anxiety.   She works in Press photographer.       Other:   She exercises 2 days a week and is active with her daughter..   MS History:   She was diagnosed with MS in 1999 after presenting with optic neuritis.  MRI was consistent with MS and she was started on Betaseron. She did very well on Betaseron for many years.  She tolerated the medication well and had no significant skin issues. She was very compliant with the medication. For the past 15 years, she has had no definite  exacerbation. However, on 06/09/2014, she underwent another MRI of the brain and it was  abnormal showing an enhancing focus in the right brachium pontis as well as her known chronic MS plaques. She started Gilenya in February.    REVIEW OF SYSTEMS: Constitutional: No fevers, chills, sweats, or change in appetite.  Mild fatigue Eyes: No visual changes, double vision, eye pain Ear, nose and throat: No hearing loss, ear pain, nasal congestion, sore throat Cardiovascular: No chest pain, palpitations Respiratory: No shortness of breath at rest or with exertion.   No wheezes GastrointestinaI: No nausea, vomiting, diarrhea, abdominal pain, fecal incontinence Genitourinary: No dysuria, urinary retention or frequency.  No nocturia.   Mild urgency at times Musculoskeletal: No neck pain, back pain Integumentary: No rash, pruritus, skin lesions Neurological: as above Psychiatric: No depression at this time.  No anxiety Endocrine: No palpitations, diaphoresis, change in appetite, change in weigh or increased thirst Hematologic/Lymphatic: No anemia, purpura, petechiae. Allergic/Immunologic: No itchy/runny eyes, nasal congestion, recent allergic reactions, rashes  ALLERGIES: Allergies  Allergen Reactions  . Pineapple Swelling    HOME MEDICATIONS:  Current outpatient prescriptions:  .  Ascorbic Acid (VITAMIN C) 100 MG tablet, Take 100 mg by mouth daily., Disp: , Rfl:  .  Cholecalciferol 2000 UNITS TABS, Take 1 tablet (2,000 Units total) by mouth daily., Disp: ,  Rfl:  .  Fingolimod HCl 0.5 MG CAPS, Take 1 capsule (0.5 mg total) by mouth daily., Disp: 30 capsule, Rfl: 6 .  Multiple Vitamin (MULTIVITAMIN WITH MINERALS) TABS, Take 1 tablet by mouth daily., Disp: , Rfl:   PAST MEDICAL HISTORY: Past Medical History  Diagnosis Date  . Multiple sclerosis ([place])   . Neuromuscular disorder ([place])     Multiple Sclerosis- affects her vision only    PAST SURGICAL HISTORY: Past Surgical History    Procedure Laterality Date  . Cesarean section    . Uterine fibroid surgery      FAMILY HISTORY: History reviewed. No pertinent family history.  SOCIAL HISTORY:  Social History   Social History  . Marital Status: Married    Spouse Name: N/A  . Number of Children: N/A  . Years of Education: N/A   Occupational History  . Not on file.   Social History Main Topics  . Smoking status: Never Smoker   . Smokeless tobacco: Never Used  . Alcohol Use: No  . Drug Use: No  . Sexual Activity: Not on file   Other Topics Concern  . Not on file   Social History Narrative     PHYSICAL EXAM  Filed Vitals:   01/29/16 0919  BP: 146/76  Pulse: 72  Resp: 16  Height: 5\' 3"  (1.6 m)  Weight: 226 lb 8 oz (102.74 kg)    Body mass index is 40.13 kg/(m^2).   General: The patient is well-developed and well-nourished and in no acute distress  Neurologic Exam  Mental status: The patient is alert and oriented x 3 at the time of the examination. The patient has apparent normal recent and remote memory, with an apparently normal attention span and concentration ability.   Speech is normal.  Cranial nerves: Extraocular movements are full.    There is good facial sensation to soft touch bilaterally.Facial strength is normal.  Trapezius and sternocleidomastoid strength is normal. No dysarthria is noted.  The tongue is midline, and the patient has symmetric elevation of the soft palate.    Motor:  Muscle bulk is normal.   Tone is normal. Strength is  5 / 5 in all 4 extremities.   Sensory: Sensory testing is intact to  soft touch and vibration sensation in all 4 extremities.  Coordination: Cerebellar testing reveals good finger-nose-finger bilaterally.  Gait and station: Station is normal.   Gait is normal. Tandem gait is mildly wide. Romberg is negative.   Reflexes: Deep tendon reflexes are symmetric and normal bilaterally.       DIAGNOSTIC DATA (LABS, IMAGING, TESTING) - I reviewed  patient records, labs, notes, testing and imaging myself where available.  Lab Results  Component Value Date   WBC 3.4 07/26/2015   HGB 12.9 04/15/2013   HCT 37.6 07/26/2015   MCV 83 07/26/2015   PLT 282 07/26/2015      Component Value Date/Time   NA 139 04/15/2013 0212   K 3.5 04/15/2013 0212   CL 102 04/15/2013 0212   CO2 23 04/15/2013 0212   GLUCOSE 101* 04/15/2013 0212   BUN 8 04/15/2013 0212   CREATININE 0.60 04/15/2013 0212   CALCIUM 9.3 04/15/2013 0212   PROT 7.2 07/26/2015 0904   PROT 8.0 04/15/2013 0212   ALBUMIN 4.1 07/26/2015 0904   ALBUMIN 3.4* 04/15/2013 0212   AST 19 07/26/2015 0904   ALT 21 07/26/2015 0904   ALKPHOS 100 07/26/2015 0904   BILITOT 0.3 07/26/2015 0904   BILITOT 0.2*  04/15/2013 0212   GFRNONAA >90 04/15/2013 0212   GFRAA >90 04/15/2013 0212   Other labs, MRI reports, office note from [facility] neurology were reviewed. Additionally, I personally reviewed the MRI images from October 2015.   ASSESSMENT AND PLAN  Multiple sclerosis ([place]) - Plan: CBC with Differential/Platelet, Hepatic function panel  Optic neuritis  High risk medication use - Plan: CBC with Differential/Platelet, Hepatic function panel  Other fatigue  Vitamin D deficiency - Plan: VITAMIN D 25 Hydroxy (Vit-D Deficiency, Fractures)     1.   Continue Gilenya. Check CBC with differential and hepatic function test today. 2.   Check vitamin D. If low, consider higher daily dose or high-dose supplements for short while per.    3.   Continue to stay active and exercises as tolerated. 4.   Return in about 6 months or sooner if there are new or worsening neurologic symptoms.   Rawson Minix A. Felecia Shelling, MD, PhD [id number], 0000000 AM Certified in Neurology, Clinical Neurophysiology, Sleep Medicine, Pain Medicine and Neuroimaging  [facility] [address] [place], [place] [address] [phone]

## 2016-01-30 LAB — CBC WITH DIFFERENTIAL/PLATELET
BASOS ABS: 0 10*3/uL (ref 0.0–0.2)
Basos: 0 %
EOS (ABSOLUTE): 0.2 10*3/uL (ref 0.0–0.4)
Eos: 6 %
HEMOGLOBIN: 12.3 g/dL (ref 11.1–15.9)
Hematocrit: 37.5 % (ref 34.0–46.6)
Immature Grans (Abs): 0 10*3/uL (ref 0.0–0.1)
Immature Granulocytes: 0 %
LYMPHS ABS: 0.3 10*3/uL — AB (ref 0.7–3.1)
Lymphs: 12 %
MCH: 27.4 pg (ref 26.6–33.0)
MCHC: 32.8 g/dL (ref 31.5–35.7)
MCV: 84 fL (ref 79–97)
MONOCYTES: 14 %
Monocytes Absolute: 0.3 10*3/uL (ref 0.1–0.9)
Neutrophils Absolute: 1.7 10*3/uL (ref 1.4–7.0)
Neutrophils: 68 %
Platelets: 261 10*3/uL (ref 150–379)
RBC: 4.49 x10E6/uL (ref 3.77–5.28)
RDW: 16 % — ABNORMAL HIGH (ref 12.3–15.4)
WBC: 2.5 10*3/uL — AB (ref 3.4–10.8)

## 2016-01-30 LAB — HEPATIC FUNCTION PANEL
ALK PHOS: 124 IU/L — AB (ref 39–117)
ALT: 21 IU/L (ref 0–32)
AST: 18 IU/L (ref 0–40)
Albumin: 4.3 g/dL (ref 3.5–5.5)
BILIRUBIN, DIRECT: 0.09 mg/dL (ref 0.00–0.40)
Bilirubin Total: 0.3 mg/dL (ref 0.0–1.2)
TOTAL PROTEIN: 7.2 g/dL (ref 6.0–8.5)

## 2016-01-30 LAB — VITAMIN D 25 HYDROXY (VIT D DEFICIENCY, FRACTURES): VIT D 25 HYDROXY: 44.1 ng/mL (ref 30.0–100.0)

## 2016-02-01 ENCOUNTER — Telehealth: Payer: Self-pay | Admitting: *Deleted

## 2016-02-01 NOTE — Telephone Encounter (Signed)
VM not set up on mobile #.  Spoke with husband at home #.  He sts. she is not home at this time.  I have left a message with him to have Pam call today regarding lab results.  He verbalized understanding of same/fim

## 2016-02-01 NOTE — Telephone Encounter (Signed)
-----   Message from Britt Bottom, MD sent at 01/30/2016  7:24 PM EDT ----- The white blood cell count is lower than I would like to see it. Please have her change Gilenya to every other day and we will recheck the blood count in 6 weeks.

## 2016-02-05 NOTE — Telephone Encounter (Signed)
I spoke with Pam last week, and per RAS, advised that wbc's are lower than he wants them to be.  She should decrease Gilenya to qod, and will recheck blood count in 6 weeks.  She verbalized understanding of same/fim

## 2016-02-05 NOTE — Telephone Encounter (Signed)
-----   Message from Britt Bottom, MD sent at 01/30/2016  7:24 PM EDT ----- The white blood cell count is lower than I would like to see it. Please have her change Gilenya to every other day and we will recheck the blood count in 6 weeks.

## 2016-02-26 MED FILL — OFLOXACIN 0.3% EAR DROPS: 0.3 | 13 days supply | Qty: 10 | Fill #0

## 2016-02-26 MED FILL — AMOXICILLIN 875 MG TABLET: 875 | 10 days supply | Qty: 20 | Fill #0

## 2016-02-28 MED FILL — GILENYA 0.5 MG CAPS: 0.5 | 30 days supply | Qty: 30 | Fill #1

## 2016-03-13 ENCOUNTER — Telehealth: Payer: Self-pay | Admitting: *Deleted

## 2016-03-13 ENCOUNTER — Other Ambulatory Visit: Payer: Self-pay | Admitting: *Deleted

## 2016-03-13 DIAGNOSIS — G35 Multiple sclerosis: Secondary | ICD-10-CM

## 2016-03-13 DIAGNOSIS — Z79899 Other long term (current) drug therapy: Secondary | ICD-10-CM

## 2016-03-13 NOTE — Telephone Encounter (Signed)
Noted.  CBC ordered in EPIC/fim

## 2016-03-13 NOTE — Telephone Encounter (Signed)
-----   Message from Britt Bottom, MD sent at 01/30/2016  7:24 PM EDT ----- The white blood cell count is lower than I would like to see it. Please have her change Gilenya to every other day and we will recheck the blood count in 6 weeks.

## 2016-03-13 NOTE — Telephone Encounter (Signed)
Pt did call back. I relayed to her she needs to come in for CBC. She will come Monday morning

## 2016-03-13 NOTE — Telephone Encounter (Signed)
I attempted to reach [facility] to remind her it is time to repeat her CBC, but vm is not set up/fim

## 2016-03-18 ENCOUNTER — Other Ambulatory Visit (INDEPENDENT_AMBULATORY_CARE_PROVIDER_SITE_OTHER): Payer: Self-pay

## 2016-03-18 DIAGNOSIS — Z0289 Encounter for other administrative examinations: Secondary | ICD-10-CM

## 2016-03-18 DIAGNOSIS — G35 Multiple sclerosis: Secondary | ICD-10-CM | POA: Diagnosis not present

## 2016-03-18 DIAGNOSIS — Z79899 Other long term (current) drug therapy: Secondary | ICD-10-CM

## 2016-03-19 LAB — CBC WITH DIFFERENTIAL/PLATELET
BASOS ABS: 0 10*3/uL (ref 0.0–0.2)
Basos: 0 %
EOS (ABSOLUTE): 0.2 10*3/uL (ref 0.0–0.4)
Eos: 5 %
HEMOGLOBIN: 12.6 g/dL (ref 11.1–15.9)
Hematocrit: 38.7 % (ref 34.0–46.6)
IMMATURE GRANS (ABS): 0 10*3/uL (ref 0.0–0.1)
Immature Granulocytes: 0 %
LYMPHS: 16 %
Lymphocytes Absolute: 0.5 10*3/uL — ABNORMAL LOW (ref 0.7–3.1)
MCH: 27.5 pg (ref 26.6–33.0)
MCHC: 32.6 g/dL (ref 31.5–35.7)
MCV: 85 fL (ref 79–97)
MONOCYTES: 13 %
Monocytes Absolute: 0.4 10*3/uL (ref 0.1–0.9)
NEUTROS ABS: 1.9 10*3/uL (ref 1.4–7.0)
Neutrophils: 66 %
Platelets: 286 10*3/uL (ref 150–379)
RBC: 4.58 x10E6/uL (ref 3.77–5.28)
RDW: 16.3 % — ABNORMAL HIGH (ref 12.3–15.4)
WBC: 2.9 10*3/uL — AB (ref 3.4–10.8)

## 2016-03-20 ENCOUNTER — Telehealth: Payer: Self-pay | Admitting: *Deleted

## 2016-03-20 NOTE — Telephone Encounter (Signed)
I have spoken with Jill Hall this morning and per RAS, advised labs look better this month.  She should continue QOD Gilenya.  She verbalized understanding of same/fim

## 2016-03-20 NOTE — Telephone Encounter (Signed)
-----   Message from Britt Bottom, MD sent at 03/19/2016  4:32 PM EDT ----- Please let her know that the white blood cell count is better and it was a month ago. She can continue on the current dose of Gilenya

## 2016-04-24 ENCOUNTER — Telehealth: Payer: Self-pay | Admitting: *Deleted

## 2016-04-24 MED ORDER — FINGOLIMOD HCL 0.5 MG PO CAPS: 0.5000 mg | ORAL_CAPSULE | Freq: Every day | ORAL | 11 refills | Status: DC

## 2016-04-24 MED FILL — GILENYA 0.5 MG CAPS: 0.5 | 30 days supply | Qty: 30 | Fill #0

## 2016-04-24 NOTE — Telephone Encounter (Signed)
Gilenya escribed to [place] per faxed request/fim

## 2016-06-18 DIAGNOSIS — Z0000 Encounter for general adult medical examination without abnormal findings: Secondary | ICD-10-CM | POA: Diagnosis not present

## 2016-06-18 DIAGNOSIS — E559 Vitamin D deficiency, unspecified: Secondary | ICD-10-CM | POA: Diagnosis not present

## 2016-06-21 DIAGNOSIS — E559 Vitamin D deficiency, unspecified: Secondary | ICD-10-CM | POA: Diagnosis not present

## 2016-06-21 DIAGNOSIS — Z0000 Encounter for general adult medical examination without abnormal findings: Secondary | ICD-10-CM | POA: Diagnosis not present

## 2016-06-21 DIAGNOSIS — E785 Hyperlipidemia, unspecified: Secondary | ICD-10-CM | POA: Diagnosis not present

## 2016-06-21 MED FILL — GILENYA 0.5 MG CAPS: 0.5 | 30 days supply | Qty: 30 | Fill #1

## 2016-06-26 ENCOUNTER — Ambulatory Visit (HOSPITAL_BASED_OUTPATIENT_CLINIC_OR_DEPARTMENT_OTHER): Payer: 59 | Admitting: Pharmacist

## 2016-06-26 DIAGNOSIS — G35 Multiple sclerosis: Secondary | ICD-10-CM

## 2016-06-26 MED ORDER — FINGOLIMOD HCL 0.5 MG PO CAPS: 0.5000 mg | ORAL_CAPSULE | Freq: Every day | ORAL | 9 refills | Status: DC

## 2016-06-26 NOTE — Progress Notes (Signed)
   S: Patient presents today to the [facility].  Patient is currently taking Gilenya for multiple sclerosis (MS). Patient is managed by Dr. Felecia Shelling for this.   Adherence: denies any missed doses   Efficacy: controls MS with no recent relapses  Dosing: 0.5 mg daily  Drug-drug interactions: none  Monitoring: Cardiovascular AEs (bradycardia/HTN/QTc): CBC: monitored by neurology, WBC has been low recently. LFTs: monitored by neurology Flu-like symptoms: denies Neurotoxicity s/sx: denies Vitamin D: last level normal   O:     Lab Results  Component Value Date   WBC 2.9 (L) 03/18/2016   HGB 12.9 04/15/2013   HCT 38.7 03/18/2016   MCV 85 03/18/2016   PLT 286 03/18/2016      Chemistry      Component Value Date/Time   NA 139 04/15/2013 0212   K 3.5 04/15/2013 0212   CL 102 04/15/2013 0212   CO2 23 04/15/2013 0212   BUN 8 04/15/2013 0212   CREATININE 0.60 04/15/2013 0212      Component Value Date/Time   CALCIUM 9.3 04/15/2013 0212   ALKPHOS 124 (H) 01/29/2016 0947   AST 18 01/29/2016 0947   ALT 21 01/29/2016 0947   BILITOT 0.3 01/29/2016 0947        A/P: 1. Medication review: Patient currently on Gilenya for the treatment of MS and patient is tolerating the medication well with no adverse effects and no recent relapses of MS. Reviewed the medication with the patient, including the increased risk of infection (will continue to monitor CBC), adverse effects such as headache, GI upset, bradycardia and HTN, and the need for regular follow up with neurology. No recommendations for any changes at this time.    Nicoletta Ba, PharmD, BCPS, BCACP, [place] [facility] [phone]

## 2016-07-30 ENCOUNTER — Encounter: Payer: Self-pay | Admitting: Neurology

## 2016-07-30 ENCOUNTER — Ambulatory Visit (INDEPENDENT_AMBULATORY_CARE_PROVIDER_SITE_OTHER): Payer: 59 | Admitting: Neurology

## 2016-07-30 VITALS — BP 144/82 | HR 76 | Resp 16 | Ht 63.0 in | Wt 219.5 lb

## 2016-07-30 DIAGNOSIS — G35 Multiple sclerosis: Secondary | ICD-10-CM

## 2016-07-30 DIAGNOSIS — H469 Unspecified optic neuritis: Secondary | ICD-10-CM | POA: Diagnosis not present

## 2016-07-30 DIAGNOSIS — R5383 Other fatigue: Secondary | ICD-10-CM | POA: Diagnosis not present

## 2016-07-30 DIAGNOSIS — Z79899 Other long term (current) drug therapy: Secondary | ICD-10-CM | POA: Diagnosis not present

## 2016-07-30 NOTE — Progress Notes (Signed)
[facility]  PATIENT: Jill Hall DOB: [birth year]  REFERRING DOCTOR OR PCP:  Cammi Fulp SOURCE: patient  _________________________________   HISTORICAL  CHIEF COMPLAINT:  Chief Complaint  Patient presents with  . Multiple Sclerosis    HISTORY OF PRESENT ILLNESS:  Jill Hall is a 55 year old woman with multiple sclerosis since 1999.     She started Gilenya in February 2016. She has no exacerbations.   She tolerates it well. . We switched her from Betaseron as she had evidence of new activity on MRI in late 2015, despite having many years of stable control of the MS.  Strength/sensation/gait:   She denies any numbness or weakness and her gait is fine. She has no trouble with stairs.     Vision:   She denies any major MS related visual changes.  Her first exacerbation was ON and she is mostly better.   She still gets mild visual blurring if she is tired.   Bladder:   She notes no significant bladder dysfunction and just has some urinary urgency.  Bowel is fine.    Fatigue/sleep:    She has a little fatigue but it does not stop her from doing what she needs to do.   She denies heat intolerance.   She works full time in Press photographer at [employer].  She generally sleeps well at night.   Cognitive/mood:  She denies any significant cognitive problems.  She denies any depression or anxiety.   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       Other:   She stays active and tries to exercises with her daughter..   MS History:   She was diagnosed with MS in 1999 after presenting with optic neuritis.  MRI was consistent with MS and she was started on Betaseron. She did very well on Betaseron for many years.  She tolerated the medication well and had no significant skin issues. She was very compliant with the medication. For the past 15 years, she has had no definite exacerbation. However, on 06/09/2014, she underwent another MRI of the brain and it was  abnormal showing an  enhancing focus in the right brachium pontis as well as her known chronic MS plaques. She started Gilenya in February 2016.    REVIEW OF SYSTEMS: Constitutional: No fevers, chills, sweats, or change in appetite.  Mild fatigue Eyes: No visual changes, double vision, eye pain Ear, nose and throat: No hearing loss, ear pain, nasal congestion, sore throat Cardiovascular: No chest pain, palpitations Respiratory: No shortness of breath at rest or with exertion.   No wheezes GastrointestinaI: No nausea, vomiting, diarrhea, abdominal pain, fecal incontinence Genitourinary: No dysuria, urinary retention or frequency.  No nocturia.   Mild urgency at times Musculoskeletal: No neck pain, back pain Integumentary: No rash, pruritus, skin lesions Neurological: as above Psychiatric: No depression at this time.  No anxiety Endocrine: No palpitations, diaphoresis, change in appetite, change in weigh or increased thirst Hematologic/Lymphatic: No anemia, purpura, petechiae. Allergic/Immunologic: No itchy/runny eyes, nasal congestion, recent allergic reactions, rashes  ALLERGIES: Allergies  Allergen Reactions  . Pineapple Swelling    HOME MEDICATIONS:  Current Outpatient Prescriptions:  .  Ascorbic Acid (VITAMIN C) 100 MG tablet, Take 100 mg by mouth daily., Disp: , Rfl:  .  Cholecalciferol 2000 UNITS TABS, Take 1 tablet (2,000 Units total) by mouth daily., Disp: , Rfl:  .  Ergocalciferol 2000 units TABS, Take 2,000 Units by mouth daily., Disp: 30 tablet, Rfl:  11 .  Fingolimod HCl 0.5 MG CAPS, Take 1 capsule (0.5 mg total) by mouth daily., Disp: 30 capsule, Rfl: 9 .  Multiple Vitamin (MULTIVITAMIN WITH MINERALS) TABS, Take 1 tablet by mouth daily., Disp: , Rfl:   PAST MEDICAL HISTORY: Past Medical History:  Diagnosis Date  . Multiple sclerosis ([place])   . Neuromuscular disorder ([place])    Multiple Sclerosis- affects her vision only    PAST SURGICAL HISTORY: Past Surgical History:  Procedure  Laterality Date  . CESAREAN SECTION    . UTERINE FIBROID SURGERY      FAMILY HISTORY: No family history on file.  SOCIAL HISTORY:  Social History   Social History  . Marital status: Married    Spouse name: N/A  . Number of children: N/A  . Years of education: N/A   Occupational History  . Not on file.   Social History Main Topics  . Smoking status: Never Smoker  . Smokeless tobacco: Never Used  . Alcohol use No  . Drug use: No  . Sexual activity: Not on file   Other Topics Concern  . Not on file   Social History Narrative  . No narrative on file     PHYSICAL EXAM  Vitals:   07/30/16 0835  BP: (!) 144/82  Pulse: 76  Resp: 16  Weight: 219 lb 8 oz (99.6 kg)  Height: 5\' 3"  (1.6 m)    Body mass index is 38.88 kg/m.   General: The patient is well-developed and well-nourished and in no acute distress  Neurologic Exam  Mental status: The patient is alert and oriented x 3 at the time of the examination. The patient has apparent normal recent and remote memory, with an apparently normal attention span and concentration ability.   Speech is normal.  Cranial nerves: Extraocular movements are full.    There is good facial sensation to soft touch bilaterally.Facial strength is normal.  Trapezius and sternocleidomastoid strength is normal. No dysarthria is noted.  The tongue is midline, and the patient has symmetric elevation of the soft palate.    Motor:  Muscle bulk is normal.   Tone is normal. Strength is  5 / 5 in all 4 extremities.   Sensory: Sensory testing is intact to  soft touch and vibration sensation in all 4 extremities.  Coordination: Cerebellar testing reveals good finger-nose-finger bilaterally.  Gait and station: Station is normal.   Gait is normal. Tandem gait is mildly wide. Romberg is negative.   Reflexes: Deep tendon reflexes are symmetric and normal bilaterally.       DIAGNOSTIC DATA (LABS, IMAGING, TESTING) - I reviewed patient records,  labs, notes, testing and imaging myself where available.  Lab Results  Component Value Date   WBC 2.9 (L) 03/18/2016   HGB 12.9 04/15/2013   HCT 38.7 03/18/2016   MCV 85 03/18/2016   PLT 286 03/18/2016      Component Value Date/Time   NA 139 04/15/2013 0212   K 3.5 04/15/2013 0212   CL 102 04/15/2013 0212   CO2 23 04/15/2013 0212   GLUCOSE 101 (H) 04/15/2013 0212   BUN 8 04/15/2013 0212   CREATININE 0.60 04/15/2013 0212   CALCIUM 9.3 04/15/2013 0212   PROT 7.2 01/29/2016 0947   ALBUMIN 4.3 01/29/2016 0947   AST 18 01/29/2016 0947   ALT 21 01/29/2016 0947   ALKPHOS 124 (H) 01/29/2016 0947   BILITOT 0.3 01/29/2016 0947   GFRNONAA >90 04/15/2013 0212   GFRAA >90 04/15/2013 [id number]  Other labs, MRI reports, office note from [facility] neurology were reviewed. Additionally, I personally reviewed the MRI images from October 2015.   ASSESSMENT AND PLAN  Multiple sclerosis ([place]) - Plan: CBC with Differential/Platelet, Comprehensive metabolic panel  High risk medication use  Other fatigue  Optic neuritis    1.   Continue Gilenya. Check CBC with differential and CMP today.  Around the time of next visit, we will check an MRI of the brain with and without contrast to determine if there is any subclinical progression that would make [place] want to switch to a different disease modifying therapy 2.   Continue OTC vitamin D   3.   Continue to stay active and exercises as tolerated. 4.   Return in about 6 months or sooner if there are new or worsening neurologic symptoms.   Richard A. Felecia Shelling, MD, PhD [id number], [id number] AM Certified in Neurology, Clinical Neurophysiology, Sleep Medicine, Pain Medicine and Neuroimaging  [facility] [address], [place], [place] [address] [phone]

## 2016-07-31 LAB — COMPREHENSIVE METABOLIC PANEL
A/G RATIO: 1.3 (ref 1.2–2.2)
ALK PHOS: 123 IU/L — AB (ref 39–117)
ALT: 27 IU/L (ref 0–32)
AST: 21 IU/L (ref 0–40)
Albumin: 4.2 g/dL (ref 3.5–5.5)
BUN/Creatinine Ratio: 18 (ref 9–23)
BUN: 13 mg/dL (ref 6–24)
Bilirubin Total: 0.4 mg/dL (ref 0.0–1.2)
CALCIUM: 9.6 mg/dL (ref 8.7–10.2)
CO2: 25 mmol/L (ref 18–29)
CREATININE: 0.73 mg/dL (ref 0.57–1.00)
Chloride: 104 mmol/L (ref 96–106)
GFR calc Af Amer: 106 mL/min/{1.73_m2} (ref >59)
GFR calc non Af Amer: 92 mL/min/{1.73_m2} (ref >59)
GLOBULIN, TOTAL: 3.2 g/dL (ref 1.5–4.5)
Glucose: 86 mg/dL (ref 65–99)
POTASSIUM: 4.5 mmol/L (ref 3.5–5.2)
SODIUM: 144 mmol/L (ref 134–144)
Total Protein: 7.4 g/dL (ref 6.0–8.5)

## 2016-07-31 LAB — CBC WITH DIFFERENTIAL/PLATELET
Basophils Absolute: 0 10*3/uL (ref 0.0–0.2)
Basos: 0 %
EOS (ABSOLUTE): 0.2 10*3/uL (ref 0.0–0.4)
EOS: 7 %
HEMATOCRIT: 37.4 % (ref 34.0–46.6)
Hemoglobin: 12.5 g/dL (ref 11.1–15.9)
IMMATURE GRANULOCYTES: 0 %
Immature Grans (Abs): 0 10*3/uL (ref 0.0–0.1)
LYMPHS ABS: 0.5 10*3/uL — AB (ref 0.7–3.1)
Lymphs: 16 %
MCH: 27.8 pg (ref 26.6–33.0)
MCHC: 33.4 g/dL (ref 31.5–35.7)
MCV: 83 fL (ref 79–97)
MONOS ABS: 0.3 10*3/uL (ref 0.1–0.9)
Monocytes: 11 %
NEUTROS PCT: 66 %
Neutrophils Absolute: 1.9 10*3/uL (ref 1.4–7.0)
PLATELETS: 291 10*3/uL (ref 150–379)
RBC: 4.5 x10E6/uL (ref 3.77–5.28)
RDW: 16.2 % — AB (ref 12.3–15.4)
WBC: 2.9 10*3/uL — AB (ref 3.4–10.8)

## 2016-08-02 ENCOUNTER — Telehealth: Payer: Self-pay | Admitting: *Deleted

## 2016-08-02 NOTE — Telephone Encounter (Signed)
VM on cell# not set up.  LMOM home# that per RAS, labwork done in our office looks ok.  No concerns noted.  She does not need to return this call unless she has questions/fim

## 2016-08-02 NOTE — Telephone Encounter (Signed)
-----   Message from Britt Bottom, MD sent at 07/31/2016  8:42 AM EST ----- Please let her know labwork is ok

## 2016-08-13 MED FILL — GILENYA 0.5 MG CAPS: 0.5 | 30 days supply | Qty: 30 | Fill #0

## 2016-09-18 DIAGNOSIS — H524 Presbyopia: Secondary | ICD-10-CM | POA: Diagnosis not present

## 2016-09-18 DIAGNOSIS — H5213 Myopia, bilateral: Secondary | ICD-10-CM | POA: Diagnosis not present

## 2016-09-18 DIAGNOSIS — H52203 Unspecified astigmatism, bilateral: Secondary | ICD-10-CM | POA: Diagnosis not present

## 2016-10-01 MED FILL — GILENYA 0.5 MG CAPS: 0.5 | 30 days supply | Qty: 30 | Fill #1

## 2016-12-09 ENCOUNTER — Other Ambulatory Visit: Payer: Self-pay | Admitting: Obstetrics & Gynecology

## 2016-12-09 DIAGNOSIS — Z1231 Encounter for screening mammogram for malignant neoplasm of breast: Secondary | ICD-10-CM

## 2016-12-25 ENCOUNTER — Ambulatory Visit
Admission: RE | Admit: 2016-12-25 | Discharge: 2016-12-25 | Disposition: A | Discharge status: Home / Self Care | Payer: 59 | Source: Ambulatory Visit | Attending: Obstetrics & Gynecology | Admitting: Obstetrics & Gynecology

## 2016-12-25 DIAGNOSIS — Z1231 Encounter for screening mammogram for malignant neoplasm of breast: Secondary | ICD-10-CM

## 2016-12-26 ENCOUNTER — Other Ambulatory Visit: Payer: Self-pay | Admitting: Obstetrics & Gynecology

## 2016-12-26 DIAGNOSIS — R928 Other abnormal and inconclusive findings on diagnostic imaging of breast: Secondary | ICD-10-CM

## 2016-12-30 ENCOUNTER — Ambulatory Visit
Admission: RE | Admit: 2016-12-30 | Discharge: 2016-12-30 | Disposition: A | Discharge status: Home / Self Care | Payer: 59 | Source: Ambulatory Visit | Attending: Obstetrics & Gynecology | Admitting: Obstetrics & Gynecology

## 2016-12-30 ENCOUNTER — Other Ambulatory Visit: Payer: Self-pay | Admitting: Obstetrics & Gynecology

## 2016-12-30 DIAGNOSIS — R928 Other abnormal and inconclusive findings on diagnostic imaging of breast: Secondary | ICD-10-CM

## 2016-12-30 DIAGNOSIS — R921 Mammographic calcification found on diagnostic imaging of breast: Secondary | ICD-10-CM | POA: Diagnosis not present

## 2016-12-30 DIAGNOSIS — N6489 Other specified disorders of breast: Secondary | ICD-10-CM | POA: Diagnosis not present

## 2016-12-30 DIAGNOSIS — N6002 Solitary cyst of left breast: Secondary | ICD-10-CM

## 2017-01-07 DIAGNOSIS — Z1389 Encounter for screening for other disorder: Secondary | ICD-10-CM | POA: Diagnosis not present

## 2017-01-07 DIAGNOSIS — E7800 Pure hypercholesterolemia, unspecified: Secondary | ICD-10-CM | POA: Diagnosis not present

## 2017-01-07 DIAGNOSIS — G35 Multiple sclerosis: Secondary | ICD-10-CM | POA: Diagnosis not present

## 2017-01-07 DIAGNOSIS — H469 Unspecified optic neuritis: Secondary | ICD-10-CM | POA: Diagnosis not present

## 2017-01-10 ENCOUNTER — Telehealth: Payer: Self-pay | Admitting: Neurology

## 2017-01-10 NOTE — Telephone Encounter (Signed)
Pt called said gilenya needs PA. Pt's insurance has changed effective 08/26/16: UMR/Save Plan Member ID: [id number] [place] # [id number] Grp# [id number] Pt said she has PA form and will fax to [phone]

## 2017-01-13 NOTE — Telephone Encounter (Signed)
Gilenya PA completed by phone with [employer] (phone# [phone]).  PA requested to be expedited, as pt. only has 2 wks. of med left.  She has been stable on Gilenya since 12/29//16.  Has tried and failed Betaseron [phone], stopped due to relapse)/fim

## 2017-01-15 DIAGNOSIS — Z01419 Encounter for gynecological examination (general) (routine) without abnormal findings: Secondary | ICD-10-CM | POA: Diagnosis not present

## 2017-01-15 DIAGNOSIS — Z6835 Body mass index (BMI) 35.0-35.9, adult: Secondary | ICD-10-CM | POA: Diagnosis not present

## 2017-01-15 MED FILL — GILENYA 0.5 MG CAPS: 0.5 | 30 days supply | Qty: 30 | Fill #2

## 2017-01-15 NOTE — Telephone Encounter (Signed)
Fax received from [employer] (phone# [phone]).  Gilenya PA approved for dates 01/15/17 thru 01/14/18.  PA # [id number]./fim

## 2017-01-28 ENCOUNTER — Encounter: Payer: Self-pay | Admitting: Neurology

## 2017-01-28 ENCOUNTER — Ambulatory Visit (INDEPENDENT_AMBULATORY_CARE_PROVIDER_SITE_OTHER): Payer: 59 | Admitting: Neurology

## 2017-01-28 VITALS — BP 150/85 | HR 83 | Resp 18 | Ht 63.0 in | Wt 223.0 lb

## 2017-01-28 DIAGNOSIS — G35 Multiple sclerosis: Secondary | ICD-10-CM

## 2017-01-28 DIAGNOSIS — Z79899 Other long term (current) drug therapy: Secondary | ICD-10-CM | POA: Diagnosis not present

## 2017-01-28 DIAGNOSIS — R5383 Other fatigue: Secondary | ICD-10-CM | POA: Diagnosis not present

## 2017-01-28 DIAGNOSIS — E559 Vitamin D deficiency, unspecified: Secondary | ICD-10-CM | POA: Diagnosis not present

## 2017-01-28 NOTE — Progress Notes (Signed)
[facility]  PATIENT: Jill Hall DOB: 08/23/1961  REFERRING DOCTOR OR PCP:  Cammi Fulp SOURCE: patient  _________________________________   HISTORICAL  CHIEF COMPLAINT:  Chief Complaint  Patient presents with  . Multiple Sclerosis    Sts. she continues to tolerate Gilenya well.  Denies new or worsening sx/fim    HISTORY OF PRESENT ILLNESS:  Jill Hall is a 56 year old woman with multiple sclerosis since 1999.       MS:    She started Gilenya in February 2016. She has no exacerbations.   She tolerates it well. . We switched her from Betaseron as she had evidence of new activity on MRI in late 2015, despite having many years of stable control of the MS.  Strength/sensation/gait:   She denies any numbness or weakness and her gait is fine. She has no trouble with stairs.     Vision:   She denies any major MS related visual changes.  Her first exacerbation was ON and she is mostly better.   She still gets mild visual blurring if she is tired.   Bladder:   She notes no significant bladder dysfunction and just has some urinary urgency.  Bowel is fine.    Fatigue/sleep:    She has a little fatigue but it does not stop her from doing what she needs to do.   She denies heat intolerance.   She works full time in Press photographer at [employer].  She generally sleeps well at night.   Cognitive/mood:  Her cognition is doing well. Mood is also doing well.   She denies any depression or anxiety.   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       Other:   She stays active and tries to exercise daily with her teenage daughter..   Vit D deficiency:   Last Vit D level was improved at 44.1.  Advised to continue supplements.    MS History:   She was diagnosed with MS in 1999 after presenting with optic neuritis.  MRI was consistent with MS and she was started on Betaseron. She did very well on Betaseron for many years.  She tolerated the medication well and had no significant  skin issues. She was very compliant with the medication. For the past 15 years, she has had no definite exacerbation. However, on 06/09/2014, she underwent another MRI of the brain and it was  abnormal showing an enhancing focus in the right brachium pontis as well as her known chronic MS plaques. She started Gilenya in February 2016.    REVIEW OF SYSTEMS: Constitutional: No fevers, chills, sweats, or change in appetite.  Mild fatigue Eyes: No visual changes, double vision, eye pain Ear, nose and throat: No hearing loss, ear pain, nasal congestion, sore throat Cardiovascular: No chest pain, palpitations Respiratory: No shortness of breath at rest or with exertion.   No wheezes GastrointestinaI: No nausea, vomiting, diarrhea, abdominal pain, fecal incontinence Genitourinary: No dysuria, urinary retention or frequency.  No nocturia.   Mild urgency at times Musculoskeletal: No neck pain, back pain Integumentary: No rash, pruritus, skin lesions Neurological: as above Psychiatric: No depression at this time.  No anxiety Endocrine: No palpitations, diaphoresis, change in appetite, change in weigh or increased thirst Hematologic/Lymphatic: No anemia, purpura, petechiae. Allergic/Immunologic: No itchy/runny eyes, nasal congestion, recent allergic reactions, rashes  ALLERGIES: Allergies  Allergen Reactions  . Pineapple Swelling    HOME MEDICATIONS:  Current Outpatient Prescriptions:  .  Ascorbic Acid (  VITAMIN C) 100 MG tablet, Take 100 mg by mouth daily., Disp: , Rfl:  .  Cholecalciferol 2000 UNITS TABS, Take 1 tablet (2,000 Units total) by mouth daily., Disp: , Rfl:  .  Ergocalciferol 2000 units TABS, Take 2,000 Units by mouth daily., Disp: 30 tablet, Rfl: 11 .  Fingolimod HCl 0.5 MG CAPS, Take 1 capsule (0.5 mg total) by mouth daily., Disp: 30 capsule, Rfl: 9 .  Multiple Vitamin (MULTIVITAMIN WITH MINERALS) TABS, Take 1 tablet by mouth daily., Disp: , Rfl:   PAST MEDICAL HISTORY: Past  Medical History:  Diagnosis Date  . Multiple sclerosis ([place])   . Neuromuscular disorder ([place])    Multiple Sclerosis- affects her vision only    PAST SURGICAL HISTORY: Past Surgical History:  Procedure Laterality Date  . CESAREAN SECTION    . UTERINE FIBROID SURGERY      FAMILY HISTORY: Family History  Problem Relation Age of Onset  . Breast cancer Mother     SOCIAL HISTORY:  Social History   Social History  . Marital status: Married    Spouse name: N/A  . Number of children: N/A  . Years of education: N/A   Occupational History  . Not on file.   Social History Main Topics  . Smoking status: Never Smoker  . Smokeless tobacco: Never Used  . Alcohol use No  . Drug use: No  . Sexual activity: Not on file   Other Topics Concern  . Not on file   Social History Narrative  . No narrative on file     PHYSICAL EXAM  Vitals:   01/28/17 0820  BP: (!) 150/85  Pulse: 83  Resp: 18  Weight: 223 lb (101.2 kg)  Height: 5\' 3"  (1.6 m)    Body mass index is 39.5 kg/m.   General: The patient is well-developed and well-nourished and in no acute distress  Neurologic Exam  Mental status: The patient is alert and oriented x 3 at the time of the examination. The patient has apparent normal recent and remote memory, with an apparently normal attention span and concentration ability.   Speech is normal.  Cranial nerves: Extraocular movements are full.    There is good facial sensation to soft touch bilaterally.Facial strength is normal.  Trapezius and sternocleidomastoid strength is normal. No dysarthria is noted.  The tongue is midline, and the patient has symmetric elevation of the soft palate.    Motor:  Muscle bulk is normal.   Tone is normal. Strength is  5 / 5 in all 4 extremities.   Sensory: Sensory testing is intact to  soft touch and vibration sensation in all 4 extremities.  Coordination: Cerebellar testing reveals good finger-nose-finger bilaterally.  Gait  and station: Station is normal.   Gait is normal. Tandem gait is mildly wide. Romberg is negative.   Reflexes: Deep tendon reflexes are symmetric and normal bilaterally.       DIAGNOSTIC DATA (LABS, IMAGING, TESTING) - I reviewed patient records, labs, notes, testing and imaging myself where available.  Lab Results  Component Value Date   WBC 2.9 (L) 07/30/2016   HGB 12.9 04/15/2013   HCT 37.4 07/30/2016   MCV 83 07/30/2016   PLT 291 07/30/2016      Component Value Date/Time   NA 144 07/30/2016 0900   K 4.5 07/30/2016 0900   CL 104 07/30/2016 0900   CO2 25 07/30/2016 0900   GLUCOSE 86 07/30/2016 0900   GLUCOSE 101 (H) 04/15/2013 3500  BUN 13 07/30/2016 0900   CREATININE 0.73 07/30/2016 0900   CALCIUM 9.6 07/30/2016 0900   PROT 7.4 07/30/2016 0900   ALBUMIN 4.2 07/30/2016 0900   AST 21 07/30/2016 0900   ALT 27 07/30/2016 0900   ALKPHOS 123 (H) 07/30/2016 0900   BILITOT 0.4 07/30/2016 0900   GFRNONAA 92 07/30/2016 0900   GFRAA 106 07/30/2016 0900     ASSESSMENT AND PLAN  Multiple sclerosis (HCC) - Plan: CBC with Differential/Platelet, Comprehensive metabolic panel, TSH  High risk medication use - Plan: CBC with Differential/Platelet, Comprehensive metabolic panel, TSH  Other fatigue  Vitamin D deficiency    1.   Continue Gilenya. Check CBC with differential and CMP today.    Consider checking MRI around the time of her next visit. 2.   She will continue the vitamin D supplements.  3.   Continue to stay active and exercises as tolerated. 4.   Return in about 6 months or sooner if there are new or worsening neurologic symptoms.   Ahaan Zobrist A. Felecia Shelling, MD, PhD 0/01/155, 1:53 PM Certified in Neurology, Clinical Neurophysiology, Sleep Medicine, Pain Medicine and Neuroimaging  [facility] [address], [place],  [address] [phone]

## 2017-01-29 ENCOUNTER — Telehealth: Payer: Self-pay | Admitting: *Deleted

## 2017-01-29 LAB — COMPREHENSIVE METABOLIC PANEL
ALK PHOS: 108 IU/L (ref 39–117)
ALT: 19 IU/L (ref 0–32)
AST: 16 IU/L (ref 0–40)
Albumin/Globulin Ratio: 1.2 (ref 1.2–2.2)
Albumin: 4.1 g/dL (ref 3.5–5.5)
BILIRUBIN TOTAL: 0.3 mg/dL (ref 0.0–1.2)
BUN/Creatinine Ratio: 14 (ref 9–23)
BUN: 9 mg/dL (ref 6–24)
CHLORIDE: 104 mmol/L (ref 96–106)
CO2: 25 mmol/L (ref 18–29)
CREATININE: 0.63 mg/dL (ref 0.57–1.00)
Calcium: 9.2 mg/dL (ref 8.7–10.2)
GFR calc Af Amer: 115 mL/min/{1.73_m2} (ref >59)
GFR calc non Af Amer: 100 mL/min/{1.73_m2} (ref >59)
Globulin, Total: 3.5 g/dL (ref 1.5–4.5)
Glucose: 79 mg/dL (ref 65–99)
Potassium: 4.4 mmol/L (ref 3.5–5.2)
Sodium: 142 mmol/L (ref 134–144)
TOTAL PROTEIN: 7.6 g/dL (ref 6.0–8.5)

## 2017-01-29 LAB — CBC WITH DIFFERENTIAL/PLATELET
Basophils Absolute: 0 10*3/uL (ref 0.0–0.2)
Basos: 0 %
EOS (ABSOLUTE): 0.2 10*3/uL (ref 0.0–0.4)
Eos: 6 %
HEMOGLOBIN: 12.2 g/dL (ref 11.1–15.9)
Hematocrit: 38.8 % (ref 34.0–46.6)
IMMATURE GRANS (ABS): 0 10*3/uL (ref 0.0–0.1)
Immature Granulocytes: 0 %
LYMPHS: 12 %
Lymphocytes Absolute: 0.3 10*3/uL — ABNORMAL LOW (ref 0.7–3.1)
MCH: 26.6 pg (ref 26.6–33.0)
MCHC: 31.4 g/dL — ABNORMAL LOW (ref 31.5–35.7)
MCV: 85 fL (ref 79–97)
MONOCYTES: 14 %
Monocytes Absolute: 0.4 10*3/uL (ref 0.1–0.9)
NEUTROS ABS: 2 10*3/uL (ref 1.4–7.0)
Neutrophils: 68 %
PLATELETS: 293 10*3/uL (ref 150–379)
RBC: 4.58 x10E6/uL (ref 3.77–5.28)
RDW: 16 % — ABNORMAL HIGH (ref 12.3–15.4)
WBC: 2.9 10*3/uL — AB (ref 3.4–10.8)

## 2017-01-29 LAB — TSH: TSH: 2.37 u[IU]/mL (ref 0.450–4.500)

## 2017-01-29 NOTE — Telephone Encounter (Signed)
-----   Message from Britt Bottom, MD sent at 01/29/2017 12:12 PM EDT ----- Please let her know that the labwork was okay. The white blood cells are still little bit low but about the same as last time.

## 2017-01-29 NOTE — Telephone Encounter (Signed)
LMOM for pt. that per RAS, lab work done in our office is ok--wbc's down a little, but about the same as last time.  She does not need to return this call unless she has questions/fim

## 2017-03-10 MED FILL — GILENYA 0.5 MG CAPS: 0.5 | 30 days supply | Qty: 30 | Fill #3

## 2017-05-05 MED FILL — GILENYA 0.5 MG CAPS: 0.5 | 30 days supply | Qty: 30 | Fill #4

## 2017-06-12 DIAGNOSIS — Z23 Encounter for immunization: Secondary | ICD-10-CM | POA: Diagnosis not present

## 2017-06-12 DIAGNOSIS — E559 Vitamin D deficiency, unspecified: Secondary | ICD-10-CM | POA: Diagnosis not present

## 2017-06-12 DIAGNOSIS — E669 Obesity, unspecified: Secondary | ICD-10-CM | POA: Diagnosis not present

## 2017-06-12 DIAGNOSIS — Z1389 Encounter for screening for other disorder: Secondary | ICD-10-CM | POA: Diagnosis not present

## 2017-06-12 DIAGNOSIS — E7800 Pure hypercholesterolemia, unspecified: Secondary | ICD-10-CM | POA: Diagnosis not present

## 2017-06-12 DIAGNOSIS — G35 Multiple sclerosis: Secondary | ICD-10-CM | POA: Diagnosis not present

## 2017-06-12 DIAGNOSIS — Z0000 Encounter for general adult medical examination without abnormal findings: Secondary | ICD-10-CM | POA: Diagnosis not present

## 2017-06-16 MED FILL — GILENYA 0.5 MG CAPS: 0.5 | 30 days supply | Qty: 30 | Fill #5

## 2017-07-02 ENCOUNTER — Other Ambulatory Visit: Payer: Self-pay | Admitting: Obstetrics & Gynecology

## 2017-07-02 ENCOUNTER — Ambulatory Visit
Admission: RE | Admit: 2017-07-02 | Discharge: 2017-07-02 | Disposition: A | Discharge status: Home / Self Care | Payer: 59 | Source: Ambulatory Visit | Attending: Obstetrics & Gynecology | Admitting: Obstetrics & Gynecology

## 2017-07-02 DIAGNOSIS — R928 Other abnormal and inconclusive findings on diagnostic imaging of breast: Secondary | ICD-10-CM | POA: Diagnosis not present

## 2017-07-02 DIAGNOSIS — N6002 Solitary cyst of left breast: Secondary | ICD-10-CM

## 2017-07-02 DIAGNOSIS — N63 Unspecified lump in unspecified breast: Secondary | ICD-10-CM

## 2017-07-02 DIAGNOSIS — N6322 Unspecified lump in the left breast, upper inner quadrant: Secondary | ICD-10-CM | POA: Diagnosis not present

## 2017-07-13 DIAGNOSIS — J019 Acute sinusitis, unspecified: Secondary | ICD-10-CM | POA: Diagnosis not present

## 2017-07-13 DIAGNOSIS — R05 Cough: Secondary | ICD-10-CM | POA: Diagnosis not present

## 2017-07-30 ENCOUNTER — Encounter: Payer: Self-pay | Admitting: Neurology

## 2017-07-30 ENCOUNTER — Other Ambulatory Visit: Payer: Self-pay

## 2017-07-30 ENCOUNTER — Ambulatory Visit (INDEPENDENT_AMBULATORY_CARE_PROVIDER_SITE_OTHER): Payer: 59 | Admitting: Neurology

## 2017-07-30 VITALS — BP 126/76 | HR 74 | Resp 16 | Ht 63.0 in | Wt 229.0 lb

## 2017-07-30 DIAGNOSIS — H469 Unspecified optic neuritis: Secondary | ICD-10-CM | POA: Diagnosis not present

## 2017-07-30 DIAGNOSIS — Z79899 Other long term (current) drug therapy: Secondary | ICD-10-CM

## 2017-07-30 DIAGNOSIS — R5383 Other fatigue: Secondary | ICD-10-CM | POA: Diagnosis not present

## 2017-07-30 DIAGNOSIS — E559 Vitamin D deficiency, unspecified: Secondary | ICD-10-CM

## 2017-07-30 DIAGNOSIS — G35 Multiple sclerosis: Secondary | ICD-10-CM | POA: Diagnosis not present

## 2017-07-30 MED ORDER — FINGOLIMOD HCL 0.5 MG PO CAPS: 0.5000 mg | ORAL_CAPSULE | Freq: Every day | ORAL | 12 refills | Status: DC

## 2017-07-30 MED FILL — GILENYA 0.5 MG CAPS: 0.5 | 30 days supply | Qty: 30 | Fill #0

## 2017-07-30 NOTE — Progress Notes (Signed)
[facility]  PATIENT: Jill Hall DOB: 1960/11/06  REFERRING DOCTOR OR PCP:  Cammi Fulp SOURCE: patient  _________________________________   HISTORICAL  CHIEF COMPLAINT:  Chief Complaint  Patient presents with  . Multiple Sclerosis    Sts. she continues to tolerate Gilenya well.  No new or worsening sx//fim    HISTORY OF PRESENT ILLNESS:  Jill Hall is a 56 year old woman with multiple sclerosis since 1999.   Update 07/30/2017:   She reports that she is doing well on Gilenya. Specifically she has not had any exacerbations that she is tolerating it well.    She notes no change in her gait, strength or sensation.   Vision is fine       She denies much fatigue.    She is sleeping well at night.     She denies any mood issues.  Cognition is doing well.  She continues on the Vit D supplements.   Her daughter is a Administrator, arts at [employer].     From 01/28/2017: MS:    She started Gilenya in February 2016. She has no exacerbations.   She tolerates it well. . We switched her from Betaseron as she had evidence of new activity on MRI in late 2015, despite having many years of stable control of the MS.  Strength/sensation/gait:   She denies any numbness or weakness and her gait is fine. She has no trouble with stairs.     Vision:   She denies any major MS related visual changes.  Her first exacerbation was ON and she is mostly better.   She still gets mild visual blurring if she is tired.   Bladder:   She notes no significant bladder dysfunction and just has some urinary urgency.  Bowel is fine.    Fatigue/sleep:    She has a little fatigue but it does not stop her from doing what she needs to do.   She denies heat intolerance.   She works full time in Press photographer at [employer].  She generally sleeps well at night.   Cognitive/mood:  Her cognition is doing well. Mood is also doing well.   She denies any depression or anxiety.   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        Other:   She stays active and tries to exercise daily with her teenage daughter..   Vit D deficiency:   Last Vit D level was improved at 44.1.  Advised to continue supplements.    MS History:   She was diagnosed with MS in 1999 after presenting with optic neuritis.  MRI was consistent with MS and she was started on Betaseron. She did very well on Betaseron for many years.  She tolerated the medication well and had no significant skin issues. She was very compliant with the medication. For the past 15 years, she has had no definite exacerbation. However, on 06/09/2014, she underwent another MRI of the brain and it was  abnormal showing an enhancing focus in the right brachium pontis as well as her known chronic MS plaques. She started Gilenya in February 2016.    REVIEW OF SYSTEMS: Constitutional: No fevers, chills, sweats, or change in appetite.  Mild fatigue Eyes: No visual changes, double vision, eye pain Ear, nose and throat: No hearing loss, ear pain, nasal congestion, sore throat Cardiovascular: No chest pain, palpitations Respiratory: No shortness of breath at rest or with exertion.   No wheezes GastrointestinaI: No nausea, vomiting, diarrhea,  abdominal pain, fecal incontinence Genitourinary: No dysuria, urinary retention or frequency.  No nocturia.   Mild urgency at times Musculoskeletal: No neck pain, back pain Integumentary: No rash, pruritus, skin lesions Neurological: as above Psychiatric: No depression at this time.  No anxiety Endocrine: No palpitations, diaphoresis, change in appetite, change in weigh or increased thirst Hematologic/Lymphatic: No anemia, purpura, petechiae. Allergic/Immunologic: No itchy/runny eyes, nasal congestion, recent allergic reactions, rashes  ALLERGIES: Allergies  Allergen Reactions  . Pineapple Swelling    HOME MEDICATIONS:  Current Outpatient Medications:  .  Ascorbic Acid (VITAMIN C) 100 MG tablet, Take 100 mg by mouth daily., Disp:  , Rfl:  .  Cholecalciferol 2000 UNITS TABS, Take 1 tablet (2,000 Units total) by mouth daily., Disp: , Rfl:  .  Ergocalciferol 2000 units TABS, Take 2,000 Units by mouth daily., Disp: 30 tablet, Rfl: 11 .  Fingolimod HCl 0.5 MG CAPS, Take 1 capsule (0.5 mg total) by mouth daily., Disp: 30 capsule, Rfl: 12 .  Multiple Vitamin (MULTIVITAMIN WITH MINERALS) TABS, Take 1 tablet by mouth daily., Disp: , Rfl:   PAST MEDICAL HISTORY: Past Medical History:  Diagnosis Date  . Multiple sclerosis ([place])   . Neuromuscular disorder ([place])    Multiple Sclerosis- affects her vision only    PAST SURGICAL HISTORY: Past Surgical History:  Procedure Laterality Date  . CESAREAN SECTION    . UTERINE FIBROID SURGERY      FAMILY HISTORY: Family History  Problem Relation Age of Onset  . Breast cancer Mother     SOCIAL HISTORY:  Social History   Socioeconomic History  . Marital status: Married    Spouse name: Not on file  . Number of children: Not on file  . Years of education: Not on file  . Highest education level: Not on file  Social Needs  . Financial resource strain: Not on file  . Food insecurity - worry: Not on file  . Food insecurity - inability: Not on file  . Transportation needs - medical: Not on file  . Transportation needs - non-medical: Not on file  Occupational History  . Not on file  Tobacco Use  . Smoking status: Never Smoker  . Smokeless tobacco: Never Used  Substance and Sexual Activity  . Alcohol use: No  . Drug use: No  . Sexual activity: Not on file  Other Topics Concern  . Not on file  Social History Narrative  . Not on file     PHYSICAL EXAM  Vitals:   07/30/17 0824  BP: 126/76  Pulse: 74  Resp: 16  Weight: 229 lb (103.9 kg)  Height: 5\' 3"  (1.6 m)    Body mass index is 40.57 kg/m.   General: The patient is well-developed and well-nourished and in no acute distress  Neurologic Exam  Mental status: The patient is alert and oriented x 3 at the  time of the examination. The patient has apparent normal recent and remote memory, with an apparently normal attention span and concentration ability.   Speech is normal.  Cranial nerves: Extraocular movements are full.  Facial strength and sensation is normal. Trapezius strength is normal.. No dysarthria is noted.  The tongue is midline, and the patient has symmetric elevation of the soft palate.    Motor:  Muscle bulk is normal.   Muscle tone is normal. Strength is 5/5 in the arms and legs..   Sensory: Sensory testing is intact to  soft touch and vibration sensation in all 4 extremities.  Coordination: Cerebellar testing reveals good finger-nose-finger bilaterally.  Gait and station: Station is normal.   Gait is normal. The tandem gait is mildly wide. The Romberg is negative..   Reflexes: Deep tendon reflexes are symmetric and normal bilaterally.       DIAGNOSTIC DATA (LABS, IMAGING, TESTING) - I reviewed patient records, labs, notes, testing and imaging myself where available.  Lab Results  Component Value Date   WBC 2.9 (L) 01/28/2017   HGB 12.2 01/28/2017   HCT 38.8 01/28/2017   MCV 85 01/28/2017   PLT 293 01/28/2017      Component Value Date/Time   NA 142 01/28/2017 0904   K 4.4 01/28/2017 0904   CL 104 01/28/2017 0904   CO2 25 01/28/2017 0904   GLUCOSE 79 01/28/2017 0904   GLUCOSE 101 (H) 04/15/2013 0212   BUN 9 01/28/2017 0904   CREATININE 0.63 01/28/2017 0904   CALCIUM 9.2 01/28/2017 0904   PROT 7.6 01/28/2017 0904   ALBUMIN 4.1 01/28/2017 0904   AST 16 01/28/2017 0904   ALT 19 01/28/2017 0904   ALKPHOS 108 01/28/2017 0904   BILITOT 0.3 01/28/2017 0904   GFRNONAA 100 01/28/2017 0904   GFRAA 115 01/28/2017 0904     ASSESSMENT AND PLAN  Multiple sclerosis ([place]) - Plan: MR BRAIN W WO CONTRAST, CBC with Differential/Platelet, Hepatic function panel, VITAMIN D 25 Hydroxy (Vit-D Deficiency, Fractures)  Other fatigue  High risk medication use - Plan: CBC with  Differential/Platelet, Hepatic function panel  Optic neuritis  Vitamin D deficiency - Plan: VITAMIN D 25 Hydroxy (Vit-D Deficiency, Fractures)    1.   She will continue Gilenya. We will check a CBC with differential and a LFT today. She will continue on the every other day dosing.   We need to also check an MRI of the brain to make sure that there is no subclinical progression. If present, consider a more efficacious disease modifying therapy.. 2.   She will continue the vitamin D supplements.  Check level today 3.   Continue to stay active and exercises as tolerated. 4.   Return in about 6 months or sooner if there are new or worsening neurologic symptoms.   Dorien Mayotte A. Felecia Shelling, MD, PhD 16/08/958, 4:54 AM Certified in Neurology, Clinical Neurophysiology, Sleep Medicine, Pain Medicine and Neuroimaging  [facility] [address] [place], [place] [address] [phone]

## 2017-07-31 LAB — CBC WITH DIFFERENTIAL/PLATELET
Basophils Absolute: 0 10*3/uL (ref 0.0–0.2)
Basos: 0 %
EOS (ABSOLUTE): 0.2 10*3/uL (ref 0.0–0.4)
Eos: 5 %
Hematocrit: 37.2 % (ref 34.0–46.6)
Hemoglobin: 12 g/dL (ref 11.1–15.9)
IMMATURE GRANS (ABS): 0 10*3/uL (ref 0.0–0.1)
IMMATURE GRANULOCYTES: 0 %
LYMPHS: 10 %
Lymphocytes Absolute: 0.4 10*3/uL — ABNORMAL LOW (ref 0.7–3.1)
MCH: 27 pg (ref 26.6–33.0)
MCHC: 32.3 g/dL (ref 31.5–35.7)
MCV: 84 fL (ref 79–97)
MONOS ABS: 0.4 10*3/uL (ref 0.1–0.9)
Monocytes: 12 %
NEUTROS PCT: 73 %
Neutrophils Absolute: 2.5 10*3/uL (ref 1.4–7.0)
PLATELETS: 299 10*3/uL (ref 150–379)
RBC: 4.44 x10E6/uL (ref 3.77–5.28)
RDW: 16.7 % — AB (ref 12.3–15.4)
WBC: 3.5 10*3/uL (ref 3.4–10.8)

## 2017-07-31 LAB — HEPATIC FUNCTION PANEL
ALT: 25 IU/L (ref 0–32)
AST: 20 IU/L (ref 0–40)
Albumin: 4.1 g/dL (ref 3.5–5.5)
Alkaline Phosphatase: 102 IU/L (ref 39–117)
BILIRUBIN TOTAL: 0.3 mg/dL (ref 0.0–1.2)
BILIRUBIN, DIRECT: 0.1 mg/dL (ref 0.00–0.40)
TOTAL PROTEIN: 7.3 g/dL (ref 6.0–8.5)

## 2017-07-31 LAB — VITAMIN D 25 HYDROXY (VIT D DEFICIENCY, FRACTURES): VIT D 25 HYDROXY: 42.7 ng/mL (ref 30.0–100.0)

## 2017-09-11 MED FILL — GILENYA 0.5 MG CAPS: 0.5 | 30 days supply | Qty: 30 | Fill #1

## 2017-09-22 DIAGNOSIS — H52203 Unspecified astigmatism, bilateral: Secondary | ICD-10-CM | POA: Diagnosis not present

## 2017-09-22 DIAGNOSIS — H5213 Myopia, bilateral: Secondary | ICD-10-CM | POA: Diagnosis not present

## 2017-09-22 DIAGNOSIS — H524 Presbyopia: Secondary | ICD-10-CM | POA: Diagnosis not present

## 2017-10-06 MED FILL — BENZONATATE 100 MG CAPS: 100 | 5 days supply | Qty: 15 | Fill #0

## 2017-12-16 ENCOUNTER — Other Ambulatory Visit: Payer: Self-pay | Admitting: Pharmacist

## 2017-12-16 ENCOUNTER — Ambulatory Visit (INDEPENDENT_AMBULATORY_CARE_PROVIDER_SITE_OTHER): Payer: 59 | Admitting: Pharmacist

## 2017-12-16 DIAGNOSIS — Z79899 Other long term (current) drug therapy: Secondary | ICD-10-CM

## 2017-12-16 MED ORDER — FINGOLIMOD HCL 0.5 MG PO CAPS: 0.5000 mg | ORAL_CAPSULE | Freq: Every day | ORAL | 10 refills | Status: DC

## 2017-12-16 MED FILL — GILENYA 0.5 MG CAPS: 0.5 | 30 days supply | Qty: 30 | Fill #0

## 2017-12-16 NOTE — Telephone Encounter (Signed)
Called patient to schedule an appointment for the [facility]. I was unable to reach the patient and VM was full. Pharmacy will place note on prescription bag to call me and I will also send patient a secure email.

## 2017-12-16 NOTE — Progress Notes (Signed)
   S: Patient presents today to the [facility] for review of her medication.  Patient is currently taking Gilenya for multiple sclerosis (MS). Patient is managed by Dr. Felecia Shelling for this.   Adherence: denies any missed doses   Efficacy: controls MS with no recent relapses  Dosing: 0.5 mg every other day. She used to take daily but has cut back to every other day due to low blood cell counts.  Drug-drug interactions: none  Monitoring: Cardiovascular AEs (bradycardia/HTN/QTc): denies CBC: monitored by neurology, last WBC was 3.5 LFTs: monitored by neurology, last ones WNL Flu-like symptoms: denies Neurotoxicity s/sx: denies Vitamin D: last level normal at 42.7   O:     Lab Results  Component Value Date   WBC 3.5 07/30/2017   HGB 12.0 07/30/2017   HCT 37.2 07/30/2017   MCV 84 07/30/2017   PLT 299 07/30/2017      Chemistry      Component Value Date/Time   NA 142 01/28/2017 0904   K 4.4 01/28/2017 0904   CL 104 01/28/2017 0904   CO2 25 01/28/2017 0904   BUN 9 01/28/2017 0904   CREATININE 0.63 01/28/2017 0904      Component Value Date/Time   CALCIUM 9.2 01/28/2017 0904   ALKPHOS 102 07/30/2017 0858   AST 20 07/30/2017 0858   ALT 25 07/30/2017 0858   BILITOT 0.3 07/30/2017 0858     Vitamin D = 42.7 (07/30/17)   A/P: 1. Medication review: Patient currently on Gilenya for the treatment of MS and patient is tolerating the medication well at every other day after being decreased due to low WBC. Denies any recent relapses of MS. Reviewed the medication with the patient, including the increased risk of infection (will continue to monitor CBC), adverse effects such as headache, GI upset, bradycardia and HTN, and the need for regular follow up with neurology. No recommendations for any changes at this time.    Christella Hartigan, PharmD, BCPS, BCACP, CPP Clinical Pharmacist Practitioner  [phone]

## 2018-01-05 ENCOUNTER — Ambulatory Visit
Admission: RE | Admit: 2018-01-05 | Discharge: 2018-01-05 | Disposition: A | Discharge status: Home / Self Care | Payer: 59 | Source: Ambulatory Visit | Attending: Obstetrics & Gynecology | Admitting: Obstetrics & Gynecology

## 2018-01-05 DIAGNOSIS — N6489 Other specified disorders of breast: Secondary | ICD-10-CM | POA: Diagnosis not present

## 2018-01-05 DIAGNOSIS — R928 Other abnormal and inconclusive findings on diagnostic imaging of breast: Secondary | ICD-10-CM | POA: Diagnosis not present

## 2018-01-05 DIAGNOSIS — N63 Unspecified lump in unspecified breast: Secondary | ICD-10-CM

## 2018-01-21 DIAGNOSIS — Z01419 Encounter for gynecological examination (general) (routine) without abnormal findings: Secondary | ICD-10-CM | POA: Diagnosis not present

## 2018-01-21 DIAGNOSIS — Z6835 Body mass index (BMI) 35.0-35.9, adult: Secondary | ICD-10-CM | POA: Diagnosis not present

## 2018-01-28 ENCOUNTER — Other Ambulatory Visit: Payer: Self-pay

## 2018-01-28 ENCOUNTER — Encounter: Payer: Self-pay | Admitting: Neurology

## 2018-01-28 ENCOUNTER — Ambulatory Visit (INDEPENDENT_AMBULATORY_CARE_PROVIDER_SITE_OTHER): Payer: 59 | Admitting: Neurology

## 2018-01-28 VITALS — BP 151/88 | HR 79 | Resp 18 | Ht 63.0 in | Wt 228.0 lb

## 2018-01-28 DIAGNOSIS — H469 Unspecified optic neuritis: Secondary | ICD-10-CM | POA: Diagnosis not present

## 2018-01-28 DIAGNOSIS — E559 Vitamin D deficiency, unspecified: Secondary | ICD-10-CM | POA: Diagnosis not present

## 2018-01-28 DIAGNOSIS — R5383 Other fatigue: Secondary | ICD-10-CM

## 2018-01-28 DIAGNOSIS — G35 Multiple sclerosis: Secondary | ICD-10-CM

## 2018-01-28 DIAGNOSIS — Z79899 Other long term (current) drug therapy: Secondary | ICD-10-CM

## 2018-01-28 NOTE — Progress Notes (Signed)
[facility]  PATIENT: Jill Hall DOB: Aug 27, 1960  REFERRING DOCTOR OR PCP:  Cammi Fulp SOURCE: patient  _________________________________   HISTORICAL  CHIEF COMPLAINT:  Chief Complaint  Patient presents with  . Multiple Sclerosis    Sts. she continues to do well with QOD Gilenya.  Denies new or worsening sx/fim    HISTORY OF PRESENT ILLNESS:  Jill Hall is a 57 year old woman with multiple sclerosis since 1999.   Update 01/28/2018: She reports doing well.   She is on Gilenya every other day and tolerates it well and has had no side effects.  She has no problems with gait, strength or sensation.   Vision is good.   Bladder is doing well.       Mood is fine.   Cognition is doing well.      Her fatigue is not too much of a problem.   She sleeps well most nights.   Vit D has been low and she takes a supplement.  Update 07/30/2017:   She reports that she is doing well on Gilenya. Specifically she has not had any exacerbations that she is tolerating it well.    She notes no change in her gait, strength or sensation.   Vision is fine       She denies much fatigue.    She is sleeping well at night.     She denies any mood issues.  Cognition is doing well.  She continues on the Vit D supplements.   Her daughter is a Administrator, arts at [employer].     From 01/28/2017: MS:    She started Gilenya in February 2016. She has no exacerbations.   She tolerates it well. . We switched her from Betaseron as she had evidence of new activity on MRI in late 2015, despite having many years of stable control of the MS.  Strength/sensation/gait:   She denies any numbness or weakness and her gait is fine. She has no trouble with stairs.     Vision:   She denies any major MS related visual changes.  Her first exacerbation was ON and she is mostly better.   She still gets mild visual blurring if she is tired.   Bladder:   She notes no significant bladder dysfunction and just has some urinary  urgency.  Bowel is fine.    Fatigue/sleep:    She has a little fatigue but it does not stop her from doing what she needs to do.   She denies heat intolerance.   She works full time in Press photographer at [employer].  She generally sleeps well at night.   Cognitive/mood:  Her cognition is doing well. Mood is also doing well.   She denies any depression or anxiety.   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       Other:   She stays active and tries to exercise daily with her teenage daughter..   Vit D deficiency:   Last Vit D level was improved at 44.1.  Advised to continue supplements.    MS History:   She was diagnosed with MS in 1999 after presenting with optic neuritis.  MRI was consistent with MS and she was started on Betaseron. She did very well on Betaseron for many years.  She tolerated the medication well and had no significant skin issues. She was very compliant with the medication. For the past 15 years, she has had no definite exacerbation. However, on 06/09/2014, she  underwent another MRI of the brain and it was  abnormal showing an enhancing focus in the right brachium pontis as well as her known chronic MS plaques. She started Gilenya in February 2016.    REVIEW OF SYSTEMS: Constitutional: No fevers, chills, sweats, or change in appetite.  Mild fatigue Eyes: No visual changes, double vision, eye pain Ear, nose and throat: No hearing loss, ear pain, nasal congestion, sore throat Cardiovascular: No chest pain, palpitations Respiratory: No shortness of breath at rest or with exertion.   No wheezes GastrointestinaI: No nausea, vomiting, diarrhea, abdominal pain, fecal incontinence Genitourinary: No dysuria, urinary retention or frequency.  No nocturia.   Mild urgency at times Musculoskeletal: No neck pain, back pain Integumentary: No rash, pruritus, skin lesions Neurological: as above Psychiatric: No depression at this time.  No anxiety Endocrine: No palpitations, diaphoresis,  change in appetite, change in weigh or increased thirst Hematologic/Lymphatic: No anemia, purpura, petechiae. Allergic/Immunologic: No itchy/runny eyes, nasal congestion, recent allergic reactions, rashes  ALLERGIES: Allergies  Allergen Reactions  . Pineapple Swelling    HOME MEDICATIONS:  Current Outpatient Medications:  .  Ascorbic Acid (VITAMIN C) 100 MG tablet, Take 100 mg by mouth daily., Disp: , Rfl:  .  Cholecalciferol 2000 UNITS TABS, Take 1 tablet (2,000 Units total) by mouth daily., Disp: , Rfl:  .  Ergocalciferol 2000 units TABS, Take 2,000 Units by mouth daily., Disp: 30 tablet, Rfl: 11 .  Fingolimod HCl 0.5 MG CAPS, Take 1 capsule (0.5 mg total) by mouth daily., Disp: 30 capsule, Rfl: 10 .  Multiple Vitamin (MULTIVITAMIN WITH MINERALS) TABS, Take 1 tablet by mouth daily., Disp: , Rfl:   PAST MEDICAL HISTORY: Past Medical History:  Diagnosis Date  . Multiple sclerosis ([place])   . Neuromuscular disorder ([place])    Multiple Sclerosis- affects her vision only    PAST SURGICAL HISTORY: Past Surgical History:  Procedure Laterality Date  . CESAREAN SECTION    . UTERINE FIBROID SURGERY      FAMILY HISTORY: Family History  Problem Relation Age of Onset  . Breast cancer Mother 66    SOCIAL HISTORY:  Social History   Socioeconomic History  . Marital status: Married    Spouse name: Not on file  . Number of children: Not on file  . Years of education: Not on file  . Highest education level: Not on file  Occupational History  . Not on file  Social Needs  . Financial resource strain: Not on file  . Food insecurity:    Worry: Not on file    Inability: Not on file  . Transportation needs:    Medical: Not on file    Non-medical: Not on file  Tobacco Use  . Smoking status: Never Smoker  . Smokeless tobacco: Never Used  Substance and Sexual Activity  . Alcohol use: No  . Drug use: No  . Sexual activity: Not on file  Lifestyle  . Physical activity:    Days  per week: Not on file    Minutes per session: Not on file  . Stress: Not on file  Relationships  . Social connections:    Talks on phone: Not on file    Gets together: Not on file    Attends religious service: Not on file    Active member of club or organization: Not on file    Attends meetings of clubs or organizations: Not on file    Relationship status: Not on file  . Intimate partner violence:  Fear of current or ex partner: Not on file    Emotionally abused: Not on file    Physically abused: Not on file    Forced sexual activity: Not on file  Other Topics Concern  . Not on file  Social History Narrative  . Not on file     PHYSICAL EXAM  Vitals:   01/28/18 0825  BP: (!) 151/88  Pulse: 79  Resp: 18  Weight: 228 lb (103.4 kg)  Height: 5\' 3"  (1.6 m)    Body mass index is 40.39 kg/m.   General: The patient is well-developed and well-nourished and in no acute distress  Neurologic Exam  Mental status: The patient is alert and oriented x 3 at the time of the examination. The patient has apparent normal recent and remote memory, with an apparently normal attention span and concentration ability.   Speech is normal.  Cranial nerves: Extraocular movements are full.  Facial strength and sensation is normal.  Trapezius strength is normal..  The tongue is midline, and the patient has symmetric elevation of the soft palate.    Motor:  Muscle bulk is normal.   Muscle tone is normal. Strength is 5/5 in the arms and legs..   Sensory: Sensory testing is intact to  soft touch and vibration sensation in all 4 extremities.  Coordination: Cerebellar testing shows good finger-nose-finger.  Gait and station: Station is normal.   Gait is normal.  The tandem gait is minimally wide.  The Romberg is negative..   Reflexes: Deep tendon reflexes are symmetric and normal bilaterally.       DIAGNOSTIC DATA (LABS, IMAGING, TESTING) - I reviewed patient records, labs, notes, testing and  imaging myself where available.  Lab Results  Component Value Date   WBC 3.5 07/30/2017   HGB 12.0 07/30/2017   HCT 37.2 07/30/2017   MCV 84 07/30/2017   PLT 299 07/30/2017      Component Value Date/Time   NA 142 01/28/2017 0904   K 4.4 01/28/2017 0904   CL 104 01/28/2017 0904   CO2 25 01/28/2017 0904   GLUCOSE 79 01/28/2017 0904   GLUCOSE 101 (H) 04/15/2013 0212   BUN 9 01/28/2017 0904   CREATININE 0.63 01/28/2017 0904   CALCIUM 9.2 01/28/2017 0904   PROT 7.3 07/30/2017 0858   ALBUMIN 4.1 07/30/2017 0858   AST 20 07/30/2017 0858   ALT 25 07/30/2017 0858   ALKPHOS 102 07/30/2017 0858   BILITOT 0.3 07/30/2017 0858   GFRNONAA 100 01/28/2017 0904   GFRAA 115 01/28/2017 0904     ASSESSMENT AND PLAN  Optic neuritis  Multiple sclerosis (HCC)  High risk medication use  Vitamin D deficiency  Other fatigue    1.    Continue Gilenya every other day.  We will check a CBC today.  We need to check an MRI of the brain to determine if she is having any subclinical progression.  If this is occurring, I would consider switching to a more efficacious medication. 2.   Continue vitamin D supplements.  The last level was fine.   3.   Continue to stay active and exercises as tolerated. 4.   Return in about 6 months or sooner if there are new or worsening neurologic symptoms.   Leylany Nored A. Felecia Shelling, MD, PhD 0/02/3709, 6:26 AM Certified in Neurology, Clinical Neurophysiology, Sleep Medicine, Pain Medicine and Neuroimaging  [facility] [address], [place], [place] [address] [phone]

## 2018-01-29 ENCOUNTER — Telehealth: Payer: Self-pay | Admitting: *Deleted

## 2018-01-29 ENCOUNTER — Telehealth: Payer: Self-pay | Admitting: Neurology

## 2018-01-29 LAB — CBC WITH DIFFERENTIAL/PLATELET
Basophils Absolute: 0 10*3/uL (ref 0.0–0.2)
Basos: 0 %
EOS (ABSOLUTE): 0.2 10*3/uL (ref 0.0–0.4)
EOS: 5 %
HEMATOCRIT: 38.6 % (ref 34.0–46.6)
HEMOGLOBIN: 12.1 g/dL (ref 11.1–15.9)
IMMATURE GRANS (ABS): 0 10*3/uL (ref 0.0–0.1)
IMMATURE GRANULOCYTES: 0 %
LYMPHS: 11 %
Lymphocytes Absolute: 0.3 10*3/uL — ABNORMAL LOW (ref 0.7–3.1)
MCH: 26.6 pg (ref 26.6–33.0)
MCHC: 31.3 g/dL — ABNORMAL LOW (ref 31.5–35.7)
MCV: 85 fL (ref 79–97)
MONOCYTES: 12 %
Monocytes Absolute: 0.3 10*3/uL (ref 0.1–0.9)
NEUTROS ABS: 2 10*3/uL (ref 1.4–7.0)
NEUTROS PCT: 72 %
PLATELETS: 270 10*3/uL (ref 150–450)
RBC: 4.55 x10E6/uL (ref 3.77–5.28)
RDW: 17.2 % — ABNORMAL HIGH (ref 12.3–15.4)
WBC: 2.8 10*3/uL — ABNORMAL LOW (ref 3.4–10.8)

## 2018-01-29 NOTE — Telephone Encounter (Signed)
-----   Message from Britt Bottom, MD sent at 01/29/2018  8:21 AM EDT ----- Labs ok for gilenya   Continue every other day

## 2018-01-29 NOTE — Telephone Encounter (Signed)
Noted/fim 

## 2018-01-29 NOTE — Telephone Encounter (Signed)
Pt returning RNs call, aware that labs were ok and to take gilenya every other day. Pt understood and appreciated the call

## 2018-01-29 NOTE — Telephone Encounter (Signed)
MR Brain w/wo contrast Dr. Scot Jun [facility] Auth: [place] Ref # [id number] & [employer] Auth: [place] Ref # Jenny Reichmann on 01/28/18. Patient is scheduled at [facility] for 02/11/18.

## 2018-01-29 NOTE — Telephone Encounter (Signed)
VM full/fim 

## 2018-02-11 ENCOUNTER — Ambulatory Visit (INDEPENDENT_AMBULATORY_CARE_PROVIDER_SITE_OTHER): Payer: 59

## 2018-02-11 DIAGNOSIS — G35 Multiple sclerosis: Secondary | ICD-10-CM | POA: Diagnosis not present

## 2018-02-11 MED ORDER — GADOPENTETATE DIMEGLUMINE 469.01 MG/ML IV SOLN
20.0000 mL | Freq: Once | INTRAVENOUS | Status: AC | PRN
  Administered 2018-02-11: 20 mL via INTRAVENOUS

## 2018-02-13 ENCOUNTER — Telehealth: Payer: Self-pay | Admitting: *Deleted

## 2018-02-13 NOTE — Telephone Encounter (Signed)
-----   Message from Britt Bottom, MD sent at 02/13/2018 10:50 AM EDT ----- Please let the patient know that the MRI does not shaw any new MS lesions

## 2018-02-13 NOTE — Telephone Encounter (Signed)
LM home# with below MRI results.  Tried cell # but vm is full.  Tried work# but this was not a direct line to her, so no message left.  She does not need to return this call unless she has questions/fim

## 2018-03-11 ENCOUNTER — Telehealth: Payer: Self-pay | Admitting: Neurology

## 2018-03-11 NOTE — Telephone Encounter (Signed)
Pt has called for RN Faith, she'd like to know if she has reached out to  [place], [place] - [address]. [phone] (Phone) [phone] (Fax)     To respond to the questions they had re: pt's Gilenya.

## 2018-03-11 NOTE — Telephone Encounter (Signed)
Spoke with Pam and let her know I spoke with [place] and her Gilenya requires a new PA. PA has been completed/fim

## 2018-03-11 NOTE — Telephone Encounter (Signed)
PA for Gilenya 0.5mg  #90/90 completed via phone with [place], phone# [phone].  Dx: RRMS (G35).  FDO date: 10/24/14 and there has been no lapse in therapy.  Tried and failed meds: Betaseron (relapse)/fim

## 2018-03-13 NOTE — Telephone Encounter (Signed)
Fax received from [facility].  Gilenya approved from 03/11/18 thru 03/11/19.  PA# [id number]/fim

## 2018-03-24 MED FILL — GILENYA 0.5 MG CAPS: 0.5 | 30 days supply | Qty: 30 | Fill #1

## 2018-04-30 ENCOUNTER — Ambulatory Visit (INDEPENDENT_AMBULATORY_CARE_PROVIDER_SITE_OTHER): Payer: 59 | Admitting: Orthopaedic Surgery

## 2018-04-30 ENCOUNTER — Ambulatory Visit (INDEPENDENT_AMBULATORY_CARE_PROVIDER_SITE_OTHER): Payer: 59

## 2018-04-30 ENCOUNTER — Encounter (INDEPENDENT_AMBULATORY_CARE_PROVIDER_SITE_OTHER): Payer: Self-pay | Admitting: Orthopaedic Surgery

## 2018-04-30 DIAGNOSIS — M1711 Unilateral primary osteoarthritis, right knee: Secondary | ICD-10-CM

## 2018-04-30 MED ORDER — METHYLPREDNISOLONE ACETATE 40 MG/ML IJ SUSP
40.0000 mg | INTRAMUSCULAR | Status: AC | PRN
  Administered 2018-04-30: 40 mg via INTRA_ARTICULAR

## 2018-04-30 MED ORDER — LIDOCAINE HCL 1 % IJ SOLN
2.0000 mL | INTRAMUSCULAR | Status: AC | PRN
  Administered 2018-04-30: 2 mL

## 2018-04-30 MED ORDER — MELOXICAM 7.5 MG PO TABS: 7.5000 mg | ORAL_TABLET | Freq: Two times a day (BID) | ORAL | 2 refills | Status: DC | PRN

## 2018-04-30 MED ORDER — DICLOFENAC SODIUM 1 % TD GEL: 2.0000 g | Freq: Four times a day (QID) | TRANSDERMAL | 5 refills | Status: DC

## 2018-04-30 MED ORDER — BUPIVACAINE HCL 0.5 % IJ SOLN
2.0000 mL | INTRAMUSCULAR | Status: AC | PRN
  Administered 2018-04-30: 2 mL via INTRA_ARTICULAR

## 2018-04-30 MED FILL — MELOXICAM 7.5 MG TABLET: 7.5 | 15 days supply | Qty: 30 | Fill #0

## 2018-04-30 MED FILL — DICLOFENAC SODIUM 1% GEL: 1 | 13 days supply | Qty: 100 | Fill #0

## 2018-04-30 NOTE — Progress Notes (Signed)
Office Visit Note   Patient: Jill Hall           Date of Birth: 1960/12/22           MRN: [id number] Visit Date: 04/30/2018              Requested by: Wenda Low, MD [place] [address] [place],  [address] PCP: Wenda Low, MD   Assessment & Plan: Visit Diagnoses:  1. Primary osteoarthritis of right knee     Plan: Impression is 57 year old female with advanced tricompartmental degenerative joint disease but clinically her pain is overall well tolerated.  We did perform a cortisone injection today.  In addition to this we gave her a prescription for Voltaren gel meloxicam to use as needed.  Home exercises and a neoprene compression sleeve was provided today.  Questions encouraged and answered.  Follow-up as needed.  Follow-Up Instructions: Return if symptoms worsen or fail to improve.   Orders:  Orders Placed This Encounter  Procedures  . XR KNEE 3 VIEW RIGHT   Meds ordered this encounter  Medications  . diclofenac sodium (VOLTAREN) 1 % GEL    Sig: Apply 2 g topically 4 (four) times daily.    Dispense:  1 Tube    Refill:  5  . meloxicam (MOBIC) 7.5 MG tablet    Sig: Take 1 tablet (7.5 mg total) by mouth 2 (two) times daily as needed for pain.    Dispense:  30 tablet    Refill:  2      Procedures: Large Joint Inj: R knee on 04/30/2018 9:04 AM Indications: pain Details: 22 G needle  Arthrogram: No  Medications: 40 mg methylPREDNISolone acetate 40 MG/ML; 2 mL lidocaine 1 %; 2 mL bupivacaine 0.5 % Outcome: tolerated well, no immediate complications Consent was given by the patient. Patient was prepped and draped in the usual sterile fashion.       Clinical Data: No additional findings.   Subjective: Chief Complaint  Patient presents with  . 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  This is a chronic problem.   Sometimes the pain is 9 out of 10.  She denies any recent injuries.  Denies any numbness and tingling.  She does endorse start up stiffness.   Review of Systems  Constitutional: Negative.   HENT: Negative.   Eyes: Negative.   Respiratory: Negative.   Cardiovascular: Negative.   Endocrine: Negative.   Musculoskeletal: Negative.   Neurological: Negative.   Hematological: Negative.   Psychiatric/Behavioral: Negative.   All other systems reviewed and are negative.    Objective: Vital Signs: LMP 01/03/2015   Physical Exam  Constitutional: She is oriented to person, place, and time. She appears well-developed and well-nourished.  HENT:  Head: Normocephalic and atraumatic.  Eyes: EOM are normal.  Neck: Neck supple.  Pulmonary/Chest: Effort normal.  Abdominal: Soft.  Neurological: She is alert and oriented to person, place, and time.  Skin: Skin is warm. Capillary refill takes less than 2 seconds.  Psychiatric: She has a normal mood and affect. Her behavior is normal. Judgment and thought content normal.  Nursing note and vitals reviewed.   Ortho Exam Right knee exam shows no joint effusion.  Collaterals and cruciates are stable. Specialty Comments:  No specialty comments available.  Imaging: Xr Knee 3 View Right  Result Date:  04/30/2018 Advanced tricompartmental DJD    PMFS History: Patient Active Problem List   Diagnosis Date Noted  . Vitamin D deficiency 01/29/2016  . Optic neuritis 10/24/2014  . Other fatigue 10/24/2014  . High risk medication use 10/19/2014  . Contraception management 08/05/2011  . Multiple sclerosis ([place]) 08/26/1997   Past Medical History:  Diagnosis Date  . Multiple sclerosis ([place])   . Neuromuscular disorder ([place])    Multiple Sclerosis- affects her vision only    Family History  Problem Relation Age of Onset  . Breast cancer Mother 53    Past Surgical History:  Procedure Laterality Date  . CESAREAN SECTION    . UTERINE FIBROID  SURGERY     Social History   Occupational History  . Not on file  Tobacco Use  . Smoking status: Never Smoker  . Smokeless tobacco: Never Used  Substance and Sexual Activity  . Alcohol use: No  . Drug use: No  . Sexual activity: Not on file

## 2018-05-12 MED FILL — GILENYA 0.5 MG CAPS: 0.5 | 30 days supply | Qty: 30 | Fill #2

## 2018-06-15 DIAGNOSIS — B029 Zoster without complications: Secondary | ICD-10-CM | POA: Diagnosis not present

## 2018-06-15 MED FILL — LIDOCAINE 5 % OINT: 5 | 14 days supply | Qty: 35 | Fill #0

## 2018-06-15 MED FILL — VALACYCLOVIR HCL 500 MG TAB: 500 | 10 days supply | Qty: 20 | Fill #0

## 2018-07-07 DIAGNOSIS — Z1389 Encounter for screening for other disorder: Secondary | ICD-10-CM | POA: Diagnosis not present

## 2018-07-07 DIAGNOSIS — Z0000 Encounter for general adult medical examination without abnormal findings: Secondary | ICD-10-CM | POA: Diagnosis not present

## 2018-07-07 DIAGNOSIS — B0229 Other postherpetic nervous system involvement: Secondary | ICD-10-CM | POA: Diagnosis not present

## 2018-07-07 DIAGNOSIS — D72819 Decreased white blood cell count, unspecified: Secondary | ICD-10-CM | POA: Diagnosis not present

## 2018-07-07 DIAGNOSIS — G35 Multiple sclerosis: Secondary | ICD-10-CM | POA: Diagnosis not present

## 2018-07-07 DIAGNOSIS — E669 Obesity, unspecified: Secondary | ICD-10-CM | POA: Diagnosis not present

## 2018-07-07 DIAGNOSIS — E7800 Pure hypercholesterolemia, unspecified: Secondary | ICD-10-CM | POA: Diagnosis not present

## 2018-07-07 MED FILL — ATORVASTATIN 10 MG TABLET: 10 | 30 days supply | Qty: 30 | Fill #0

## 2018-07-07 MED FILL — GABAPENTIN 100 MG CAPS: 100 | 30 days supply | Qty: 30 | Fill #0

## 2018-07-17 MED FILL — LIDOCAINE 5 % OINT: 5 | 14 days supply | Qty: 35 | Fill #1

## 2018-07-17 MED FILL — DICLOFENAC SODIUM 1% GEL: 1 | 13 days supply | Qty: 100 | Fill #1

## 2018-07-17 MED FILL — GILENYA 0.5 MG CAPS: 0.5 | 30 days supply | Qty: 30 | Fill #3

## 2018-07-30 ENCOUNTER — Ambulatory Visit (INDEPENDENT_AMBULATORY_CARE_PROVIDER_SITE_OTHER): Payer: 59 | Admitting: Neurology

## 2018-07-30 ENCOUNTER — Encounter: Payer: Self-pay | Admitting: Neurology

## 2018-07-30 VITALS — BP 142/84 | HR 79 | Ht 67.0 in | Wt 223.5 lb

## 2018-07-30 DIAGNOSIS — R5383 Other fatigue: Secondary | ICD-10-CM | POA: Diagnosis not present

## 2018-07-30 DIAGNOSIS — Z79899 Other long term (current) drug therapy: Secondary | ICD-10-CM

## 2018-07-30 DIAGNOSIS — G35 Multiple sclerosis: Secondary | ICD-10-CM

## 2018-07-30 DIAGNOSIS — E559 Vitamin D deficiency, unspecified: Secondary | ICD-10-CM | POA: Diagnosis not present

## 2018-07-30 DIAGNOSIS — H469 Unspecified optic neuritis: Secondary | ICD-10-CM | POA: Diagnosis not present

## 2018-07-30 NOTE — Progress Notes (Signed)
[facility]  PATIENT: Jill Hall DOB: [birth year]  REFERRING DOCTOR OR PCP:  Cammi Fulp SOURCE: patient  _________________________________   HISTORICAL  CHIEF COMPLAINT:  Chief Complaint  Patient presents with  . Follow-up    Room 13, alone. Here to f/u on MS. Denies any vision changes. She had shingles back in September on her back and left side. She was treated with Valtrex and it has resolved. Has some tingling sensation left over. She also states her right knee is bone on bone and had steroid inj back in September which helped.     HISTORY OF PRESENT ILLNESS:  Jill Hall is a 57 year old woman with multiple sclerosis since 1999.   Update 07/30/18: She feels her MS has been stable and she denies any exacerbations.  She is on Gilenya and she tolerates it well.  She had shingles develop 2 months ago..   They resolved but she still has some itching.   This is not bad enough to treat.     Her MS symptoms are doing very well.  She does not note any problems with balance.  Her gait is doing a little bit worse but she has a lot of right knee pain.  She denies any numbness or weakness.  She denies any difficulties with her bladder function.  Vision is doing well.  Fatigue is not much of a problem.  She notes no difficulty with cognition.  Mood is doing well.  She is sleeping well.  She also has knee pain and received a shot recently   Update 01/28/2018: She reports doing well.   She is on Gilenya every other day and tolerates it well and has had no side effects.  She has no problems with gait, strength or sensation.   Vision is good.   Bladder is doing well.       Mood is fine.   Cognition is doing well.      Her fatigue is not too much of a problem.   She sleeps well most nights.   Vit D has been low and she takes a supplement.  Update 07/30/2017:   She reports that she is doing well on Gilenya. Specifically she has not had any exacerbations that she is tolerating  it well.    She notes no change in her gait, strength or sensation.   Vision is fine       She denies much fatigue.    She is sleeping well at night.     She denies any mood issues.  Cognition is doing well.  She continues on the Vit D supplements.   Her daughter is a Administrator, arts at [employer].     From 01/28/2017: MS:    She started Gilenya in February 2016. She has no exacerbations.   She tolerates it well. . We switched her from Betaseron as she had evidence of new activity on MRI in late 2015, despite having many years of stable control of the MS.  Strength/sensation/gait:   She denies any numbness or weakness and her gait is fine. She has no trouble with stairs.     Vision:   She denies any major MS related visual changes.  Her first exacerbation was ON and she is mostly better.   She still gets mild visual blurring if she is tired.   Bladder:   She notes no significant bladder dysfunction and just has some urinary urgency.  Bowel is fine.    Fatigue/sleep:  She has a little fatigue but it does not stop her from doing what she needs to do.   She denies heat intolerance.   She works full time in Press photographer at [employer].  She generally sleeps well at night.   Cognitive/mood:  Her cognition is doing well. Mood is also doing well.   She denies any depression or anxiety.   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       Other:   She stays active and tries to exercise daily with her teenage daughter..   Vit D deficiency:   Last Vit D level was improved at 44.1.  Advised to continue supplements.    MS History:   She was diagnosed with MS in 1999 after presenting with optic neuritis.  MRI was consistent with MS and she was started on Betaseron. She did very well on Betaseron for many years.  She tolerated the medication well and had no significant skin issues. She was very compliant with the medication. For the past 15 years, she has had no definite exacerbation. However, on 06/09/2014, she  underwent another MRI of the brain and it was  abnormal showing an enhancing focus in the right brachium pontis as well as her known chronic MS plaques. She started Gilenya in February 2016.    REVIEW OF SYSTEMS: Constitutional: No fevers, chills, sweats, or change in appetite.  Mild fatigue Eyes: No visual changes, double vision, eye pain Ear, nose and throat: No hearing loss, ear pain, nasal congestion, sore throat Cardiovascular: No chest pain, palpitations Respiratory: No shortness of breath at rest or with exertion.   No wheezes GastrointestinaI: No nausea, vomiting, diarrhea, abdominal pain, fecal incontinence Genitourinary: No dysuria, urinary retention or frequency.  No nocturia.   Mild urgency at times Musculoskeletal: No neck pain, back pain Integumentary: No rash, pruritus, skin lesions Neurological: as above Psychiatric: No depression at this time.  No anxiety Endocrine: No palpitations, diaphoresis, change in appetite, change in weigh or increased thirst Hematologic/Lymphatic: No anemia, purpura, petechiae. Allergic/Immunologic: No itchy/runny eyes, nasal congestion, recent allergic reactions, rashes  ALLERGIES: Allergies  Allergen Reactions  . Pineapple Swelling    HOME MEDICATIONS:  Current Outpatient Medications:  .  Ascorbic Acid (VITAMIN C) 100 MG tablet, Take 100 mg by mouth daily., Disp: , Rfl:  .  atorvastatin (LIPITOR) 10 MG tablet, Take 10 mg by mouth daily., Disp: , Rfl: 6 .  Cholecalciferol 2000 UNITS TABS, Take 1 tablet (2,000 Units total) by mouth daily., Disp: , Rfl:  .  diclofenac sodium (VOLTAREN) 1 % GEL, Apply 2 g topically 4 (four) times daily., Disp: 1 Tube, Rfl: 5 .  Ergocalciferol 2000 units TABS, Take 2,000 Units by mouth daily., Disp: 30 tablet, Rfl: 11 .  Fingolimod HCl 0.5 MG CAPS, Take 1 capsule (0.5 mg total) by mouth daily., Disp: 30 capsule, Rfl: 10 .  Multiple Vitamin (MULTIVITAMIN WITH MINERALS) TABS, Take 1 tablet by mouth daily.,  Disp: , Rfl:  .  meloxicam (MOBIC) 7.5 MG tablet, Take 1 tablet (7.5 mg total) by mouth 2 (two) times daily as needed for pain. (Patient not taking: Reported on 07/30/2018), Disp: 30 tablet, Rfl: 2  PAST MEDICAL HISTORY: Past Medical History:  Diagnosis Date  . Multiple sclerosis ([place])   . Neuromuscular disorder ([place])    Multiple Sclerosis- affects her vision only    PAST SURGICAL HISTORY: Past Surgical History:  Procedure Laterality Date  . CESAREAN SECTION    . UTERINE FIBROID  SURGERY      FAMILY HISTORY: Family History  Problem Relation Age of Onset  . Breast cancer Mother 48    SOCIAL HISTORY:  Social History   Socioeconomic History  . Marital status: Married    Spouse name: Not on file  . Number of children: Not on file  . Years of education: Not on file  . Highest education level: Not on file  Occupational History  . Not on file  Social Needs  . Financial resource strain: Not on file  . Food insecurity:    Worry: Not on file    Inability: Not on file  . Transportation needs:    Medical: Not on file    Non-medical: Not on file  Tobacco Use  . Smoking status: Never Smoker  . Smokeless tobacco: Never Used  Substance and Sexual Activity  . Alcohol use: No  . Drug use: No  . Sexual activity: Not on file  Lifestyle  . Physical activity:    Days per week: Not on file    Minutes per session: Not on file  . Stress: Not on file  Relationships  . Social connections:    Talks on phone: Not on file    Gets together: Not on file    Attends religious service: Not on file    Active member of club or organization: Not on file    Attends meetings of clubs or organizations: Not on file    Relationship status: Not on file  . Intimate partner violence:    Fear of current or ex partner: Not on file    Emotionally abused: Not on file    Physically abused: Not on file    Forced sexual activity: Not on file  Other Topics Concern  . Not on file  Social History  Narrative  . Not on file     PHYSICAL EXAM  Vitals:   07/30/18 1122  BP: (!) 142/84  Pulse: 79  Weight: 223 lb 8 oz (101.4 kg)  Height: 5\' 7"  (1.702 m)    Body mass index is 35.01 kg/m.   General: The patient is well-developed and well-nourished and in no acute distress  Neurologic Exam  Mental status: The patient is alert and oriented x 3 at the time of the examination. The patient has apparent normal recent and remote memory, with an apparently normal attention span and concentration ability.   Speech is normal.  Cranial nerves: Extraocular movements are full.  Facial strength and sensation is normal.  Trapezius strength is normal.  The tongue is midline, and the patient has symmetric elevation of the soft palate.    Motor:  Muscle bulk is normal.   Muscle tone is normal. Strength is 5/5 in the arms and legs..   Sensory: Sensory testing is intact to  soft touch and vibration sensation in all 4 extremities.  Coordination: Cerebellar testing shows good finger-nose-finger.  Gait and station: Station is normal.   The gait is arthritic due to the knee pain.  Tandem gait is mildly wide.  The Romberg is negative..   Reflexes: Deep tendon reflexes are symmetric and normal bilaterally.       DIAGNOSTIC DATA (LABS, IMAGING, TESTING) - I reviewed patient records, labs, notes, testing and imaging myself where available.  Lab Results  Component Value Date   WBC 2.8 (L) 01/28/2018   HGB 12.1 01/28/2018   HCT 38.6 01/28/2018   MCV 85 01/28/2018   PLT 270 01/28/2018  Component Value Date/Time   NA 142 01/28/2017 0904   K 4.4 01/28/2017 0904   CL 104 01/28/2017 0904   CO2 25 01/28/2017 0904   GLUCOSE 79 01/28/2017 0904   GLUCOSE 101 (H) 04/15/2013 0212   BUN 9 01/28/2017 0904   CREATININE 0.63 01/28/2017 0904   CALCIUM 9.2 01/28/2017 0904   PROT 7.3 07/30/2017 0858   ALBUMIN 4.1 07/30/2017 0858   AST 20 07/30/2017 0858   ALT 25 07/30/2017 0858   ALKPHOS 102  07/30/2017 0858   BILITOT 0.3 07/30/2017 0858   GFRNONAA 100 01/28/2017 0904   GFRAA 115 01/28/2017 0904     ASSESSMENT AND PLAN  Multiple sclerosis (HCC) - Plan: CBC with Differential/Platelet  Optic neuritis  High risk medication use  Other fatigue  Vitamin D deficiency    1.   Continue Gilenya every other day.  Check CBC with differential.   2.   Continue vitamin D supplements.  The last level was fine.   3.   Continue to stay active and exercises as tolerated. 4.   Return in about 6 months or sooner if there are new or worsening neurologic symptoms.   Avaree Gilberti A. Felecia Shelling, MD, PhD 84/01/6598, 3:57 PM Certified in Neurology, Clinical Neurophysiology, Sleep Medicine, Pain Medicine and Neuroimaging  [facility] [address], [place], [place] [address] [phone]

## 2018-07-31 ENCOUNTER — Telehealth: Payer: Self-pay | Admitting: *Deleted

## 2018-07-31 LAB — CBC WITH DIFFERENTIAL/PLATELET
BASOS: 0 %
Basophils Absolute: 0 10*3/uL (ref 0.0–0.2)
EOS (ABSOLUTE): 0.3 10*3/uL (ref 0.0–0.4)
EOS: 8 %
HEMATOCRIT: 38.4 % (ref 34.0–46.6)
Hemoglobin: 12.6 g/dL (ref 11.1–15.9)
Immature Grans (Abs): 0 10*3/uL (ref 0.0–0.1)
Immature Granulocytes: 1 %
LYMPHS ABS: 0.4 10*3/uL — AB (ref 0.7–3.1)
Lymphs: 9 %
MCH: 27.2 pg (ref 26.6–33.0)
MCHC: 32.8 g/dL (ref 31.5–35.7)
MCV: 83 fL (ref 79–97)
MONOS ABS: 0.4 10*3/uL (ref 0.1–0.9)
Monocytes: 11 %
NEUTROS ABS: 2.7 10*3/uL (ref 1.4–7.0)
Neutrophils: 71 %
Platelets: 305 10*3/uL (ref 150–450)
RBC: 4.63 x10E6/uL (ref 3.77–5.28)
RDW: 15.4 % (ref 12.3–15.4)
WBC: 3.8 10*3/uL (ref 3.4–10.8)

## 2018-07-31 MED FILL — ATORVASTATIN 10 MG TABLET: 10 | 30 days supply | Qty: 30 | Fill #1

## 2018-07-31 NOTE — Telephone Encounter (Signed)
-----   Message from Britt Bottom, MD sent at 07/31/2018  8:53 AM EST ----- Please let the patient know that the lab work is fine.

## 2018-07-31 NOTE — Telephone Encounter (Signed)
Called and spoke with pt. Advised lab work is okay. She verbalized understanding.

## 2018-09-11 MED FILL — GILENYA 0.5 MG CAPS: 0.5 | 30 days supply | Qty: 30 | Fill #4

## 2018-09-11 MED FILL — ATORVASTATIN 10 MG TABLET: 10 | 30 days supply | Qty: 30 | Fill #2

## 2018-09-14 DIAGNOSIS — E7849 Other hyperlipidemia: Secondary | ICD-10-CM | POA: Diagnosis not present

## 2018-09-24 DIAGNOSIS — H5213 Myopia, bilateral: Secondary | ICD-10-CM | POA: Diagnosis not present

## 2018-09-24 DIAGNOSIS — H52203 Unspecified astigmatism, bilateral: Secondary | ICD-10-CM | POA: Diagnosis not present

## 2018-09-24 DIAGNOSIS — H524 Presbyopia: Secondary | ICD-10-CM | POA: Diagnosis not present

## 2018-10-20 MED FILL — ATORVASTATIN 10 MG TABLET: 10 | 90 days supply | Qty: 90 | Fill #3

## 2018-11-11 MED FILL — DICLOFENAC SODIUM 1% GEL: 1 | 13 days supply | Qty: 100 | Fill #2

## 2018-11-11 MED FILL — GILENYA 0.5 MG CAPS: 0.5 | 30 days supply | Qty: 30 | Fill #5

## 2019-01-14 ENCOUNTER — Other Ambulatory Visit: Payer: Self-pay | Admitting: Obstetrics & Gynecology

## 2019-01-14 DIAGNOSIS — Z1231 Encounter for screening mammogram for malignant neoplasm of breast: Secondary | ICD-10-CM

## 2019-01-26 ENCOUNTER — Other Ambulatory Visit: Payer: Self-pay | Admitting: Neurology

## 2019-01-26 ENCOUNTER — Other Ambulatory Visit: Payer: Self-pay | Admitting: Internal Medicine

## 2019-01-26 MED FILL — MELOXICAM 7.5 MG TABLET: 7.5 | 15 days supply | Qty: 30 | Fill #1

## 2019-02-09 DIAGNOSIS — Z01419 Encounter for gynecological examination (general) (routine) without abnormal findings: Secondary | ICD-10-CM | POA: Diagnosis not present

## 2019-02-09 DIAGNOSIS — Z6835 Body mass index (BMI) 35.0-35.9, adult: Secondary | ICD-10-CM | POA: Diagnosis not present

## 2019-02-24 ENCOUNTER — Ambulatory Visit (INDEPENDENT_AMBULATORY_CARE_PROVIDER_SITE_OTHER): Payer: 59 | Admitting: Neurology

## 2019-02-24 ENCOUNTER — Encounter: Payer: Self-pay | Admitting: Neurology

## 2019-02-24 ENCOUNTER — Other Ambulatory Visit: Payer: Self-pay

## 2019-02-24 ENCOUNTER — Other Ambulatory Visit: Payer: Self-pay | Admitting: Pharmacist

## 2019-02-24 VITALS — BP 143/84 | HR 74 | Temp 97.7°F | Ht 67.0 in | Wt 227.0 lb

## 2019-02-24 DIAGNOSIS — Z79899 Other long term (current) drug therapy: Secondary | ICD-10-CM | POA: Diagnosis not present

## 2019-02-24 DIAGNOSIS — E559 Vitamin D deficiency, unspecified: Secondary | ICD-10-CM

## 2019-02-24 DIAGNOSIS — G35 Multiple sclerosis: Secondary | ICD-10-CM | POA: Diagnosis not present

## 2019-02-24 MED ORDER — FINGOLIMOD HCL 0.5 MG PO CAPS: 0.5000 mg | ORAL_CAPSULE | Freq: Every day | ORAL | 12 refills | Status: DC

## 2019-02-24 MED FILL — GILENYA 0.5 MG CAPS: 0.5 | 30 days supply | Qty: 30 | Fill #0

## 2019-02-24 NOTE — Progress Notes (Signed)
[facility]  PATIENT: Jill Hall DOB: 1961-03-03  REFERRING DOCTOR OR PCP:  Cammi Fulp SOURCE: patient  _________________________________   HISTORICAL  CHIEF COMPLAINT:  Chief Complaint  Patient presents with  . Follow-up    RM 12, alone. Last seen 07/30/2018. Doing well, no new sx.  . Multiple Sclerosis    On Gilenya     HISTORY OF PRESENT ILLNESS:  Jill Hall is a 58 year old woman with multiple sclerosis since 1999.   Update 02/24/2019: Her MS has been stable.   She has no exacerbations. MRI 01/2018 showed no new lesions.      She has been on Gilenya long term and tolerates it well.    She has no new symptoms.   Gait, strength and sensation are doing well.     Bladder is fine.   Vision is doing well.  She denies much fatigue.   She sleeps well at night.    Mood and cognition are doing well.     We discussed that Gilenya could be associated with higher risks for COVID-19 though there is not clear evidence.  She is socially distancing and is good about washing her hands.  She only goes into the office once or twice a month early in the morning and works from home the rest of the time  Update 07/30/18: She feels her MS has been stable and she denies any exacerbations.  She is on Gilenya and she tolerates it well.  She had shingles develop 2 months ago..   They resolved but she still has some itching.   This is not bad enough to treat.     Her MS symptoms are doing very well.  She does not note any problems with balance.  Her gait is doing a little bit worse but she has a lot of right knee pain.  She denies any numbness or weakness.  She denies any difficulties with her bladder function.  Vision is doing well.  Fatigue is not much of a problem.  She notes no difficulty with cognition.  Mood is doing well.  She is sleeping well.  She also has knee pain and received a shot recently   Update 01/28/2018: She reports doing well.   She is on Gilenya every other  day and tolerates it well and has had no side effects.  She has no problems with gait, strength or sensation.   Vision is good.   Bladder is doing well.       Mood is fine.   Cognition is doing well.      Her fatigue is not too much of a problem.   She sleeps well most nights.   Vit D has been low and she takes a supplement.  Update 07/30/2017:   She reports that she is doing well on Gilenya. Specifically she has not had any exacerbations that she is tolerating it well.    She notes no change in her gait, strength or sensation.   Vision is fine       She denies much fatigue.    She is sleeping well at night.     She denies any mood issues.  Cognition is doing well.  She continues on the Vit D supplements.   Her daughter is a Administrator, arts at [employer].     From 01/28/2017: MS:    She started Gilenya in February 2016. She has no exacerbations.   She tolerates it well. . We switched her from Betaseron  as she had evidence of new activity on MRI in late 2015, despite having many years of stable control of the MS.  Strength/sensation/gait:   She denies any numbness or weakness and her gait is fine. She has no trouble with stairs.     Vision:   She denies any major MS related visual changes.  Her first exacerbation was ON and she is mostly better.   She still gets mild visual blurring if she is tired.   Bladder:   She notes no significant bladder dysfunction and just has some urinary urgency.  Bowel is fine.    Fatigue/sleep:    She has a little fatigue but it does not stop her from doing what she needs to do.   She denies heat intolerance.   She works full time in Press photographer at [employer].  She generally sleeps well at night.   Cognitive/mood:  Her cognition is doing well. Mood is also doing well.   She denies any depression or anxiety.   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       Other:   She stays active and tries to exercise daily with her teenage daughter..   Vit D deficiency:   Last Vit D  level was improved at 44.1.  Advised to continue supplements.    MS History:   She was diagnosed with MS in 1999 after presenting with optic neuritis.  MRI was consistent with MS and she was started on Betaseron. She did very well on Betaseron for many years.  She tolerated the medication well and had no significant skin issues. She was very compliant with the medication. For the past 15 years, she has had no definite exacerbation. However, on 06/09/2014, she underwent another MRI of the brain and it was  abnormal showing an enhancing focus in the right brachium pontis as well as her known chronic MS plaques. She started Gilenya in February 2016.    REVIEW OF SYSTEMS: Constitutional: No fevers, chills, sweats, or change in appetite.  Mild fatigue Eyes: No visual changes, double vision, eye pain Ear, nose and throat: No hearing loss, ear pain, nasal congestion, sore throat Cardiovascular: No chest pain, palpitations Respiratory: No shortness of breath at rest or with exertion.   No wheezes GastrointestinaI: No nausea, vomiting, diarrhea, abdominal pain, fecal incontinence Genitourinary: No dysuria, urinary retention or frequency.  No nocturia.   Mild urgency at times Musculoskeletal: No neck pain, back pain Integumentary: No rash, pruritus, skin lesions Neurological: as above Psychiatric: No depression at this time.  No anxiety Endocrine: No palpitations, diaphoresis, change in appetite, change in weigh or increased thirst Hematologic/Lymphatic: No anemia, purpura, petechiae. Allergic/Immunologic: No itchy/runny eyes, nasal congestion, recent allergic reactions, rashes  ALLERGIES: Allergies  Allergen Reactions  . Pineapple Swelling    HOME MEDICATIONS:  Current Outpatient Medications:  .  Ascorbic Acid (VITAMIN C) 100 MG tablet, Take 100 mg by mouth daily., Disp: , Rfl:  .  atorvastatin (LIPITOR) 10 MG tablet, Take 10 mg by mouth daily., Disp: , Rfl: 6 .  Cholecalciferol 2000 UNITS  TABS, Take 1 tablet (2,000 Units total) by mouth daily., Disp: , Rfl:  .  diclofenac sodium (VOLTAREN) 1 % GEL, Apply 2 g topically 4 (four) times daily., Disp: 1 Tube, Rfl: 5 .  Ergocalciferol 2000 units TABS, Take 2,000 Units by mouth daily., Disp: 30 tablet, Rfl: 11 .  Fingolimod HCl 0.5 MG CAPS, Take 1 capsule (0.5 mg total) by mouth daily., Disp:  30 capsule, Rfl: 10 .  Multiple Vitamin (MULTIVITAMIN WITH MINERALS) TABS, Take 1 tablet by mouth daily., Disp: , Rfl:   PAST MEDICAL HISTORY: Past Medical History:  Diagnosis Date  . Multiple sclerosis ([place])   . Neuromuscular disorder ([place])    Multiple Sclerosis- affects her vision only    PAST SURGICAL HISTORY: Past Surgical History:  Procedure Laterality Date  . CESAREAN SECTION    . UTERINE FIBROID SURGERY      FAMILY HISTORY: Family History  Problem Relation Age of Onset  . Breast cancer Mother 79    SOCIAL HISTORY:  Social History   Socioeconomic History  . Marital status: Married    Spouse name: Not on file  . Number of children: Not on file  . Years of education: Not on file  . Highest education level: Not on file  Occupational History  . Not on file  Social Needs  . Financial resource strain: Not on file  . Food insecurity    Worry: Not on file    Inability: Not on file  . Transportation needs    Medical: Not on file    Non-medical: Not on file  Tobacco Use  . Smoking status: Never Smoker  . Smokeless tobacco: Never Used  Substance and Sexual Activity  . Alcohol use: No  . Drug use: No  . Sexual activity: Not on file  Lifestyle  . Physical activity    Days per week: Not on file    Minutes per session: Not on file  . Stress: Not on file  Relationships  . Social Herbalist on phone: Not on file    Gets together: Not on file    Attends religious service: Not on file    Active member of club or organization: Not on file    Attends meetings of clubs or organizations: Not on file     Relationship status: Not on file  . Intimate partner violence    Fear of current or ex partner: Not on file    Emotionally abused: Not on file    Physically abused: Not on file    Forced sexual activity: Not on file  Other Topics Concern  . Not on file  Social History Narrative  . Not on file     PHYSICAL EXAM  Vitals:   02/24/19 1446  BP: (!) 143/84  Pulse: 74  Temp: 97.7 F (36.5 C)  Weight: 227 lb (103 kg)  Height: 5\' 7"  (1.702 m)    Body mass index is 35.55 kg/m.   General: The patient is well-developed and well-nourished and in no acute distress  Neurologic Exam  Mental status: The patient is alert and oriented x 3 at the time of the examination. The patient has apparent normal recent and remote memory, with an apparently normal attention span and concentration ability.   Speech is normal.  Cranial nerves: Extraocular movements are full.  Facial strength and sensation is normal.  Trapezius strength is normal.  The tongue is midline, and the patient has symmetric elevation of the soft palate.    Motor:  Muscle bulk is normal.   Muscle tone is normal. Strength is 5/5 in the arms and legs..   Sensory: Sensory testing is intact to  soft touch and vibration sensation in all 4 extremities.  Coordination: Cerebellar testing shows good finger-nose-finger.  Gait and station: Station is normal.   Gait is arthritic (knee pain).  Tandem gait is mildly wide.  The  Romberg is negative..   Reflexes: Deep tendon reflexes are symmetric and normal bilaterally.       DIAGNOSTIC DATA (LABS, IMAGING, TESTING) - I reviewed patient records, labs, notes, testing and imaging myself where available.  Lab Results  Component Value Date   WBC 3.8 07/30/2018   HGB 12.6 07/30/2018   HCT 38.4 07/30/2018   MCV 83 07/30/2018   PLT 305 07/30/2018      Component Value Date/Time   NA 142 01/28/2017 0904   K 4.4 01/28/2017 0904   CL 104 01/28/2017 0904   CO2 25 01/28/2017 0904    GLUCOSE 79 01/28/2017 0904   GLUCOSE 101 (H) 04/15/2013 0212   BUN 9 01/28/2017 0904   CREATININE 0.63 01/28/2017 0904   CALCIUM 9.2 01/28/2017 0904   PROT 7.3 07/30/2017 0858   ALBUMIN 4.1 07/30/2017 0858   AST 20 07/30/2017 0858   ALT 25 07/30/2017 0858   ALKPHOS 102 07/30/2017 0858   BILITOT 0.3 07/30/2017 0858   GFRNONAA 100 01/28/2017 0904   GFRAA 115 01/28/2017 0904     ASSESSMENT AND PLAN   1. Multiple sclerosis ([place])   2. High risk medication use   3. Vitamin D deficiency     1.   Continue Gilenya every other day.  Check CBC with differential and CMP. 2.   Continue vitamin D supplements.  Her last level was fine in 2019. 3.   Continue to stay active and exercises as tolerated. 4.   Return in about 12 months or sooner if there are new or worsening neurologic symptoms.   Cornisha Zetino A. Felecia Shelling, MD, PhD 5/0/5397, 6:73 PM Certified in Neurology, Clinical Neurophysiology, Sleep Medicine, Pain Medicine and Neuroimaging  [facility] [address], [place], [place] [address] [phone]

## 2019-02-25 LAB — CBC WITH DIFFERENTIAL/PLATELET
Basophils Absolute: 0 10*3/uL (ref 0.0–0.2)
Basos: 1 %
EOS (ABSOLUTE): 0.2 10*3/uL (ref 0.0–0.4)
Eos: 6 %
Hematocrit: 36.9 % (ref 34.0–46.6)
Hemoglobin: 12.3 g/dL (ref 11.1–15.9)
Immature Grans (Abs): 0 10*3/uL (ref 0.0–0.1)
Immature Granulocytes: 0 %
Lymphocytes Absolute: 0.3 10*3/uL — ABNORMAL LOW (ref 0.7–3.1)
Lymphs: 11 %
MCH: 27 pg (ref 26.6–33.0)
MCHC: 33.3 g/dL (ref 31.5–35.7)
MCV: 81 fL (ref 79–97)
Monocytes Absolute: 0.3 10*3/uL (ref 0.1–0.9)
Monocytes: 11 %
Neutrophils Absolute: 1.9 10*3/uL (ref 1.4–7.0)
Neutrophils: 71 %
Platelets: 281 10*3/uL (ref 150–450)
RBC: 4.56 x10E6/uL (ref 3.77–5.28)
RDW: 15.6 % — ABNORMAL HIGH (ref 11.7–15.4)
WBC: 2.6 10*3/uL — ABNORMAL LOW (ref 3.4–10.8)

## 2019-02-26 MED FILL — ATORVASTATIN 10 MG TABLET: 10 | 30 days supply | Qty: 30 | Fill #4

## 2019-03-04 ENCOUNTER — Ambulatory Visit: Payer: 59

## 2019-03-08 ENCOUNTER — Telehealth: Payer: Self-pay | Admitting: *Deleted

## 2019-03-08 NOTE — Telephone Encounter (Signed)
-----   Message from Britt Bottom, MD sent at 03/05/2019  6:09 PM EDT ----- Labs look good    Continue gilenya at current dose

## 2019-03-12 ENCOUNTER — Other Ambulatory Visit: Payer: Self-pay

## 2019-03-12 ENCOUNTER — Ambulatory Visit
Admission: RE | Admit: 2019-03-12 | Discharge: 2019-03-12 | Disposition: A | Discharge status: Home / Self Care | Payer: 59 | Source: Ambulatory Visit | Attending: Obstetrics & Gynecology | Admitting: Obstetrics & Gynecology

## 2019-03-12 DIAGNOSIS — Z1231 Encounter for screening mammogram for malignant neoplasm of breast: Secondary | ICD-10-CM

## 2019-04-14 MED FILL — ATORVASTATIN 10 MG TABLET: 10 | 30 days supply | Qty: 30 | Fill #0

## 2019-05-13 ENCOUNTER — Telehealth: Payer: Self-pay | Admitting: *Deleted

## 2019-05-13 NOTE — Telephone Encounter (Signed)
Received fax from [facility] that PA required for [place]. Initiated PA on CMM. Key:Key: [id number] - PA Case ID: [id number]. Submitted as urgent. Waiting on determination.

## 2019-05-17 MED FILL — GILENYA 0.5 MG CAPS: 0.5 | 30 days supply | Qty: 30 | Fill #1

## 2019-05-17 NOTE — Telephone Encounter (Signed)
Received fax notification from [employer] that [place] approved 05/13/19-05/11/20 for max 12 fills. PA ref number: [id number].

## 2019-05-31 MED FILL — ATORVASTATIN 10 MG TABLET: 10 | 30 days supply | Qty: 30 | Fill #1

## 2019-07-02 ENCOUNTER — Telehealth: Payer: Self-pay

## 2019-07-02 ENCOUNTER — Ambulatory Visit (INDEPENDENT_AMBULATORY_CARE_PROVIDER_SITE_OTHER): Payer: 59 | Admitting: Orthopaedic Surgery

## 2019-07-02 ENCOUNTER — Other Ambulatory Visit: Payer: Self-pay

## 2019-07-02 DIAGNOSIS — M1711 Unilateral primary osteoarthritis, right knee: Secondary | ICD-10-CM | POA: Diagnosis not present

## 2019-07-02 MED ORDER — METHYLPREDNISOLONE ACETATE 40 MG/ML IJ SUSP
40.0000 mg | INTRAMUSCULAR | Status: AC | PRN
  Administered 2019-07-02: 40 mg via INTRA_ARTICULAR

## 2019-07-02 MED ORDER — BUPIVACAINE HCL 0.5 % IJ SOLN
2.0000 mL | INTRAMUSCULAR | Status: AC | PRN
  Administered 2019-07-02: 09:00:00 2 mL via INTRA_ARTICULAR

## 2019-07-02 MED ORDER — LIDOCAINE HCL 1 % IJ SOLN
2.0000 mL | INTRAMUSCULAR | Status: AC | PRN
  Administered 2019-07-02: 09:00:00 2 mL

## 2019-07-02 NOTE — Progress Notes (Signed)
   Office Visit Note   Patient: Jill Hall           Date of Birth: Sep 26, 1960           MRN: [id number] Visit Date: 07/02/2019              Requested by: Wenda Low, MD [address] [place] [address] [place],  [place] [address] PCP: Wenda Low, MD   Assessment & Plan: Visit Diagnoses:  1. Primary osteoarthritis of right knee     Plan: My impression is advanced to right knee DJD.  Patient had really good relief with previous cortisone injection therefore we will repeat this today.  We will also submit preauthorization for Synvisc injection.  Again we had a great discussion on the likelihood of total knee replacement in the future.  Follow-Up Instructions: Return if symptoms worsen or fail to improve.   Orders:  No orders of the defined types were placed in this encounter.  No orders of the defined types were placed in this encounter.     Procedures: Large Joint Inj: R knee on 07/02/2019 9:14 AM Indications: pain Details: 22 G needle  Arthrogram: No  Medications: 2 mL bupivacaine 0.5 %; 2 mL lidocaine 1 %; 40 mg methylPREDNISolone acetate 40 MG/ML Consent was given by the patient. Patient was prepped and draped in the usual sterile fashion.       Clinical Data: No additional findings.   Subjective: Chief Complaint  Patient presents with  . Right Knee - Pain    Jill Hall returns today for follow-up of chronic right knee pain due to DJD.  Previous cortisone injection was over a year ago and she responded well to this.  She is recently started having pain again.  She is interested in another cortisone injection as well as viscosupplementation.   Review of Systems   Objective: Vital Signs: LMP 01/03/2015   Physical Exam  Ortho Exam Right knee exam shows no joint effusion.  Normal range of motion.  Varus alignment.  Palpable crepitus.  Specialty Comments:  No specialty comments available.  Imaging: No results found.   PMFS History: Patient Active  Problem List   Diagnosis Date Noted  . Vitamin D deficiency 01/29/2016  . Optic neuritis 10/24/2014  . Other fatigue 10/24/2014  . High risk medication use 10/19/2014  . Contraception management 08/05/2011  . Multiple sclerosis ([place]) 08/26/1997   Past Medical History:  Diagnosis Date  . Multiple sclerosis ([place])   . Neuromuscular disorder ([place])    Multiple Sclerosis- affects her vision only    Family History  Problem Relation Age of Onset  . Breast cancer Mother 58    Past Surgical History:  Procedure Laterality Date  . CESAREAN SECTION    . UTERINE FIBROID SURGERY     Social History   Occupational History  . Not on file  Tobacco Use  . Smoking status: Never Smoker  . Smokeless tobacco: Never Used  Substance and Sexual Activity  . Alcohol use: No  . Drug use: No  . Sexual activity: Not on file

## 2019-07-02 NOTE — Telephone Encounter (Signed)
Right knee synvisc one

## 2019-07-05 MED FILL — GILENYA 0.5 MG CAPS: 0.5 | 30 days supply | Qty: 30 | Fill #2

## 2019-07-05 MED FILL — ATORVASTATIN 10 MG TABLET: 10 | 30 days supply | Qty: 30 | Fill #2

## 2019-07-05 NOTE — Telephone Encounter (Signed)
Submitted online MySynviscOne for Synvisc One right knee. Xu patient.

## 2019-07-06 NOTE — Telephone Encounter (Signed)
PA required, see VOB, also must meet deductible prior to plan paying on this, [employer] pays at 60% of both codes, and [employer] pays 80% of remainder after deductible met.  I will call patient to discuss and see how she wants to proceed.

## 2019-07-08 NOTE — Telephone Encounter (Signed)
I called patient discussed, she will call insurance to try to find out an estimate on what she would owe.  She will call us back to let us know about proceeding with injections.

## 2019-07-09 DIAGNOSIS — R030 Elevated blood-pressure reading, without diagnosis of hypertension: Secondary | ICD-10-CM | POA: Diagnosis not present

## 2019-07-09 DIAGNOSIS — Z23 Encounter for immunization: Secondary | ICD-10-CM | POA: Diagnosis not present

## 2019-07-09 DIAGNOSIS — E7800 Pure hypercholesterolemia, unspecified: Secondary | ICD-10-CM | POA: Diagnosis not present

## 2019-07-09 DIAGNOSIS — Z1389 Encounter for screening for other disorder: Secondary | ICD-10-CM | POA: Diagnosis not present

## 2019-07-09 DIAGNOSIS — Z0000 Encounter for general adult medical examination without abnormal findings: Secondary | ICD-10-CM | POA: Diagnosis not present

## 2019-07-09 DIAGNOSIS — G35 Multiple sclerosis: Secondary | ICD-10-CM | POA: Diagnosis not present

## 2019-07-13 ENCOUNTER — Telehealth: Payer: Self-pay | Admitting: Radiology

## 2019-07-13 NOTE — Telephone Encounter (Signed)
Patient will wait until the new year to do visco injections.  She will have Centivo/Focus primary and BC secondary. She will call us in January to remind [place] to check benefits for a early February injection.

## 2019-07-13 NOTE — Telephone Encounter (Signed)
-----   Message from [place] Peirce Deveney, RT sent at 07/08/2019  2:53 PM EST ----- Regarding: visco supplementation I called patient advised her I am checking on benefits for Centivo/Focus for visco supplementation.  I will call her back once I see what their benefits are.  We would need to see if anything changes for the new year.

## 2019-08-13 MED FILL — GILENYA 0.5 MG CAPS: 0.5 | 30 days supply | Qty: 30 | Fill #3

## 2019-08-13 MED FILL — ATORVASTATIN 10 MG TABLET: 10 | 30 days supply | Qty: 30 | Fill #3

## 2019-09-20 ENCOUNTER — Telehealth: Payer: Self-pay | Admitting: Neurology

## 2019-09-20 NOTE — Telephone Encounter (Signed)
Called pt back. Advised Dr. Felecia Shelling okay with her getting either Pfizer or Moderna vaccine. She is getting her first dose today. Nothing further needed.

## 2019-09-20 NOTE — Telephone Encounter (Signed)
Pt called wanting to know if it would be ok to receive the Covid Vaccination. She is scheduled to receive it today at work. Please advise.

## 2019-09-28 MED FILL — ATORVASTATIN 10 MG TABLET: 10 | 30 days supply | Qty: 30 | Fill #4

## 2019-10-08 DIAGNOSIS — H5213 Myopia, bilateral: Secondary | ICD-10-CM | POA: Diagnosis not present

## 2019-11-16 MED FILL — ATORVASTATIN 10 MG TABLET: 10 | 30 days supply | Qty: 30 | Fill #5

## 2019-11-17 MED FILL — GILENYA 0.5 MG CAPS: 0.5 | 30 days supply | Qty: 30 | Fill #0

## 2019-11-19 ENCOUNTER — Telehealth: Payer: Self-pay | Admitting: Orthopaedic Surgery

## 2019-11-19 NOTE — Telephone Encounter (Signed)
Submitted online [id number] for Durolane, as U[employer] is primary ins.  Pending.   Autumn, sending to you so you are aware I submitted, thanks.

## 2019-11-19 NOTE — Telephone Encounter (Signed)
Patient called and stated she hasn't heard anything from anyone in regards to gel injection November.  Patient has new insurance now and wants this PA so she can get a gel injection as asap.  Patient has the Choice Plan w/[employer].  Please call patient top advise.[phone]

## 2019-11-19 NOTE — Telephone Encounter (Signed)
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

## 2019-11-19 NOTE — Telephone Encounter (Signed)
Noted I will look out for it

## 2019-11-22 NOTE — Telephone Encounter (Signed)
Ok thank you 

## 2019-11-22 NOTE — Telephone Encounter (Signed)
I printed the VOB's from the portal.  I am reviewing.

## 2019-11-23 NOTE — Telephone Encounter (Signed)
disregard

## 2019-11-24 NOTE — Telephone Encounter (Signed)
I called patient and advised that her primary ins covers Durolane at 100%, admin and meds.  [employer] covers at 80% and requires PA. As primary should cover all costs, we will proceed with Durolane.  No copay should be made as we don't charge an OV.

## 2019-11-24 NOTE — Telephone Encounter (Signed)
Ok perfect, have you informed the pt?

## 2019-11-24 NOTE — Telephone Encounter (Signed)
Yes, sorry, forgot to add that part.  I scheduled her and told her.

## 2019-11-24 NOTE — Telephone Encounter (Signed)
I called LM to call me here at [facility] office to discuss VOB Durolane.

## 2019-12-10 ENCOUNTER — Ambulatory Visit (INDEPENDENT_AMBULATORY_CARE_PROVIDER_SITE_OTHER): Payer: 59 | Admitting: Orthopaedic Surgery

## 2019-12-10 ENCOUNTER — Other Ambulatory Visit: Payer: Self-pay

## 2019-12-10 ENCOUNTER — Encounter: Payer: Self-pay | Admitting: Orthopaedic Surgery

## 2019-12-10 VITALS — Ht 67.0 in | Wt 227.0 lb

## 2019-12-10 DIAGNOSIS — M1711 Unilateral primary osteoarthritis, right knee: Secondary | ICD-10-CM

## 2019-12-10 MED ORDER — LIDOCAINE HCL 1 % IJ SOLN
2.0000 mL | INTRAMUSCULAR | Status: AC | PRN
  Administered 2019-12-10: 09:00:00 2 mL

## 2019-12-10 MED ORDER — BUPIVACAINE HCL 0.25 % IJ SOLN
2.0000 mL | INTRAMUSCULAR | Status: AC | PRN
  Administered 2019-12-10: 09:00:00 2 mL via INTRA_ARTICULAR

## 2019-12-10 MED ORDER — SODIUM HYALURONATE 60 MG/3ML IX PRSY
60.0000 mg | PREFILLED_SYRINGE | INTRA_ARTICULAR | Status: AC | PRN
  Administered 2019-12-10: 60 mg via INTRA_ARTICULAR

## 2019-12-10 NOTE — Progress Notes (Signed)
   Procedure Note  Patient: Jill Hall             Date of Birth: 07-03-1961           MRN: [id number]             Visit Date: 12/10/2019  Procedures: Visit Diagnoses:  1. Unilateral primary osteoarthritis, right knee     Large Joint Inj: R knee on 12/10/2019 8:39 AM Indications: pain Details: 22 G needle, anterolateral approach Medications: 2 mL lidocaine 1 %; 2 mL bupivacaine 0.25 %; 60 mg Sodium Hyaluronate 60 MG/3ML

## 2019-12-24 MED FILL — ATORVASTATIN 10 MG TABLET: 10 | 90 days supply | Qty: 90 | Fill #0

## 2020-01-10 DIAGNOSIS — Z23 Encounter for immunization: Secondary | ICD-10-CM | POA: Diagnosis not present

## 2020-01-12 MED FILL — GILENYA 0.5 MG CAPS: 0.5 | 30 days supply | Qty: 30 | Fill #1

## 2020-02-21 MED FILL — GILENYA 0.5 MG CAPS: 0.5 | 30 days supply | Qty: 30 | Fill #2

## 2020-02-23 ENCOUNTER — Other Ambulatory Visit: Payer: Self-pay | Admitting: Obstetrics & Gynecology

## 2020-02-23 DIAGNOSIS — Z1231 Encounter for screening mammogram for malignant neoplasm of breast: Secondary | ICD-10-CM

## 2020-02-24 DIAGNOSIS — Z6836 Body mass index (BMI) 36.0-36.9, adult: Secondary | ICD-10-CM | POA: Diagnosis not present

## 2020-02-24 DIAGNOSIS — Z01419 Encounter for gynecological examination (general) (routine) without abnormal findings: Secondary | ICD-10-CM | POA: Diagnosis not present

## 2020-02-24 DIAGNOSIS — Z13820 Encounter for screening for osteoporosis: Secondary | ICD-10-CM | POA: Diagnosis not present

## 2020-03-01 ENCOUNTER — Encounter: Payer: Self-pay | Admitting: Neurology

## 2020-03-01 ENCOUNTER — Ambulatory Visit (INDEPENDENT_AMBULATORY_CARE_PROVIDER_SITE_OTHER): Payer: 59 | Admitting: Neurology

## 2020-03-01 VITALS — BP 150/85 | HR 85 | Ht 67.0 in | Wt 228.0 lb

## 2020-03-01 DIAGNOSIS — R5383 Other fatigue: Secondary | ICD-10-CM | POA: Diagnosis not present

## 2020-03-01 DIAGNOSIS — Z79899 Other long term (current) drug therapy: Secondary | ICD-10-CM

## 2020-03-01 DIAGNOSIS — G35 Multiple sclerosis: Secondary | ICD-10-CM

## 2020-03-01 DIAGNOSIS — G35D Multiple sclerosis, unspecified: Secondary | ICD-10-CM

## 2020-03-01 DIAGNOSIS — E559 Vitamin D deficiency, unspecified: Secondary | ICD-10-CM

## 2020-03-01 NOTE — Progress Notes (Signed)
[facility]  PATIENT: Jill Hall DOB: 03-Mar-1961  REFERRING DOCTOR OR PCP:  Cammi Fulp SOURCE: patient  _________________________________   HISTORICAL  CHIEF COMPLAINT:  Chief Complaint  Patient presents with  . Follow-up    RM 12, alone. Last seen 02/24/2019. No new sx.  . Multiple Sclerosis    On Gilenya qod    HISTORY OF PRESENT ILLNESS:  Jill Hall is a 59 year old woman with relapsing remittingmultiple sclerosis since 1999.   Update 03/01/2020: Her MS has been stable and she has no exacerbations. MRI 01/2018 showed no new lesions.    She has been on Gilenya long term and tolerates it well.    She has no new symptoms.   Gait, strength and sensation are doing well.   Balance is fine.   She needs a knee replacement (right) so uses a bannister on stairs.   She walks some as exercise daily.   She also uses a stationary bicycle.   Bladder is fine.   Vision is doing well.  She denies much fatigue - it is mild and not limiting now.   She sleeps well at night.    Mood and cognition are doing well.   Her daughter is going to [employer] in the fall to study Chemistry.  She got the Covid vaccination.      Update 02/24/2019: Her MS has been stable.   She has no exacerbations. MRI 01/2018 showed no new lesions.      She has been on Gilenya long term and tolerates it well.    She has no new symptoms.   Gait, strength and sensation are doing well.     Bladder is fine.   Vision is doing well.  She denies much fatigue.   She sleeps well at night.    Mood and cognition are doing well.     We discussed that Gilenya could be associated with higher risks for COVID-19 though there is not clear evidence.  She is socially distancing and is good about washing her hands.  She only goes into the office once or twice a month early in the morning and works from home the rest of the time  Update 07/30/18: She feels her MS has been stable and she denies any exacerbations.  She is on  Gilenya and she tolerates it well.  She had shingles develop 2 months ago..   They resolved but she still has some itching.   This is not bad enough to treat.     Her MS symptoms are doing very well.  She does not note any problems with balance.  Her gait is doing a little bit worse but she has a lot of right knee pain.  She denies any numbness or weakness.  She denies any difficulties with her bladder function.  Vision is doing well.  Fatigue is not much of a problem.  She notes no difficulty with cognition.  Mood is doing well.  She is sleeping well.  She also has knee pain and received a shot recently   Update 01/28/2018: She reports doing well.   She is on Gilenya every other day and tolerates it well and has had no side effects.  She has no problems with gait, strength or sensation.   Vision is good.   Bladder is doing well.       Mood is fine.   Cognition is doing well.      Her fatigue is not too much  of a problem.   She sleeps well most nights.   Vit D has been low and she takes a supplement.  Update 07/30/2017:   She reports that she is doing well on Gilenya. Specifically she has not had any exacerbations that she is tolerating it well.    She notes no change in her gait, strength or sensation.   Vision is fine       She denies much fatigue.    She is sleeping well at night.     She denies any mood issues.  Cognition is doing well.  She continues on the Vit D supplements.   Her daughter is a Administrator, arts at [employer].     From 01/28/2017: MS:    She started Gilenya in February 2016. She has no exacerbations.   She tolerates it well. . We switched her from Betaseron as she had evidence of new activity on MRI in late 2015, despite having many years of stable control of the MS.  Strength/sensation/gait:   She denies any numbness or weakness and her gait is fine. She has no trouble with stairs.     Vision:   She denies any major MS related visual changes.  Her first exacerbation was ON and she is  mostly better.   She still gets mild visual blurring if she is tired.   Bladder:   She notes no significant bladder dysfunction and just has some urinary urgency.  Bowel is fine.    Fatigue/sleep:    She has a little fatigue but it does not stop her from doing what she needs to do.   She denies heat intolerance.   She works full time in Press photographer at [employer].  She generally sleeps well at night.   Cognitive/mood:  Her cognition is doing well. Mood is also doing well.   She denies any depression or anxiety.   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       Other:   She stays active and tries to exercise daily with her teenage daughter..   Vit D deficiency:   Last Vit D level was improved at 44.1.  Advised to continue supplements.    MS History:   She was diagnosed with MS in 1999 after presenting with optic neuritis.  MRI was consistent with MS and she was started on Betaseron. She did very well on Betaseron for many years.  She tolerated the medication well and had no significant skin issues. She was very compliant with the medication. For the past 15 years, she has had no definite exacerbation. However, on 06/09/2014, she underwent another MRI of the brain and it was  abnormal showing an enhancing focus in the right brachium pontis as well as her known chronic MS plaques. She started Gilenya in February 2016.    REVIEW OF SYSTEMS: Constitutional: No fevers, chills, sweats, or change in appetite.  Mild fatigue Eyes: No visual changes, double vision, eye pain Ear, nose and throat: No hearing loss, ear pain, nasal congestion, sore throat Cardiovascular: No chest pain, palpitations Respiratory: No shortness of breath at rest or with exertion.   No wheezes GastrointestinaI: No nausea, vomiting, diarrhea, abdominal pain, fecal incontinence Genitourinary: No dysuria, urinary retention or frequency.  No nocturia.   Mild urgency at times Musculoskeletal: No neck pain, back pain Integumentary:  No rash, pruritus, skin lesions Neurological: as above Psychiatric: No depression at this time.  No anxiety Endocrine: No palpitations, diaphoresis, change in appetite, change in  weigh or increased thirst Hematologic/Lymphatic: No anemia, purpura, petechiae. Allergic/Immunologic: No itchy/runny eyes, nasal congestion, recent allergic reactions, rashes  ALLERGIES: Allergies  Allergen Reactions  . Pineapple Swelling    HOME MEDICATIONS:  Current Outpatient Medications:  .  Ascorbic Acid (VITAMIN C) 100 MG tablet, Take 100 mg by mouth daily., Disp: , Rfl:  .  atorvastatin (LIPITOR) 10 MG tablet, Take 10 mg by mouth daily., Disp: , Rfl: 6 .  Cholecalciferol 2000 UNITS TABS, Take 1 tablet (2,000 Units total) by mouth daily., Disp: , Rfl:  .  diclofenac sodium (VOLTAREN) 1 % GEL, Apply 2 g topically 4 (four) times daily., Disp: 1 Tube, Rfl: 5 .  Ergocalciferol 2000 units TABS, Take 2,000 Units by mouth daily., Disp: 30 tablet, Rfl: 11 .  Fingolimod HCl 0.5 MG CAPS, Take 1 capsule (0.5 mg total) by mouth daily., Disp: 30 capsule, Rfl: 12 .  Multiple Vitamin (MULTIVITAMIN WITH MINERALS) TABS, Take 1 tablet by mouth daily., Disp: , Rfl:   PAST MEDICAL HISTORY: Past Medical History:  Diagnosis Date  . Multiple sclerosis ([place])   . Neuromuscular disorder ([place])    Multiple Sclerosis- affects her vision only    PAST SURGICAL HISTORY: Past Surgical History:  Procedure Laterality Date  . CESAREAN SECTION    . UTERINE FIBROID SURGERY      FAMILY HISTORY: Family History  Problem Relation Age of Onset  . Breast cancer Mother 3    SOCIAL HISTORY:  Social History   Socioeconomic History  . Marital status: Married    Spouse name: Not on file  . Number of children: Not on file  . Years of education: Not on file  . Highest education level: Not on file  Occupational History  . Not on file  Tobacco Use  . Smoking status: Never Smoker  . Smokeless tobacco: Never Used  Substance and  Sexual Activity  . Alcohol use: No  . Drug use: No  . Sexual activity: Not on file  Other Topics Concern  . Not on file  Social History Narrative  . Not on file   Social Determinants of Health   Financial Resource Strain:   . Difficulty of Paying Living Expenses:   Food Insecurity:   . Worried About Charity fundraiser in the Last Year:   . Arboriculturist in the Last Year:   Transportation Needs:   . Film/video editor (Medical):   Marland Kitchen Lack of Transportation (Non-Medical):   Physical Activity:   . Days of Exercise per Week:   . Minutes of Exercise per Session:   Stress:   . Feeling of Stress :   Social Connections:   . Frequency of Communication with Friends and Family:   . Frequency of Social Gatherings with Friends and Family:   . Attends Religious Services:   . Active Member of Clubs or Organizations:   . Attends Archivist Meetings:   Marland Kitchen Marital Status:   Intimate Partner Violence:   . Fear of Current or Ex-Partner:   . Emotionally Abused:   Marland Kitchen Physically Abused:   . Sexually Abused:      PHYSICAL EXAM  Vitals:   03/01/20 0933  BP: (!) 150/85  Pulse: 85  Weight: 228 lb (103.4 kg)  Height: 5\' 7"  (1.702 m)    Body mass index is 35.71 kg/m.   General: The patient is well-developed and well-nourished and in no acute distress  Neurologic Exam  Mental status: The patient is alert  and oriented x 3 at the time of the examination. The patient has apparent normal recent and remote memory, with an apparently normal attention span and concentration ability.   Speech is normal.  Cranial nerves: Extraocular movements are full.  Facial strength and sensation is normal.  Trapezius strength is normal. Hearing seems normal.   Motor:  Muscle bulk is normal.   Muscle tone is normal. Strength is 5/5 in the arms and legs..   Sensory: Intact sensation to  soft touch and vibration sensation in all 4 extremities.  Coordination: Cerebellar testing shows good  finger-nose-finger.  Gait and station: Station is normal.   Gait is arthritic (knee pain).  Tandem gait is mildly wide.  The Romberg is negative..   Reflexes: Deep tendon reflexes are symmetric and normal bilaterally.       DIAGNOSTIC DATA (LABS, IMAGING, TESTING) - I reviewed patient records, labs, notes, testing and imaging myself where available.  Lab Results  Component Value Date   WBC 2.6 (L) 02/24/2019   HGB 12.3 02/24/2019   HCT 36.9 02/24/2019   MCV 81 02/24/2019   PLT 281 02/24/2019      Component Value Date/Time   NA 142 01/28/2017 0904   K 4.4 01/28/2017 0904   CL 104 01/28/2017 0904   CO2 25 01/28/2017 0904   GLUCOSE 79 01/28/2017 0904   GLUCOSE 101 (H) 04/15/2013 0212   BUN 9 01/28/2017 0904   CREATININE 0.63 01/28/2017 0904   CALCIUM 9.2 01/28/2017 0904   PROT 7.3 07/30/2017 0858   ALBUMIN 4.1 07/30/2017 0858   AST 20 07/30/2017 0858   ALT 25 07/30/2017 0858   ALKPHOS 102 07/30/2017 0858   BILITOT 0.3 07/30/2017 0858   GFRNONAA 100 01/28/2017 0904   GFRAA 115 01/28/2017 0904     ASSESSMENT AND PLAN   1. Multiple sclerosis ([place])   2. High risk medication use   3. Other fatigue   4. Vitamin D deficiency     1.   Continue Gilenya every other day.  Check CBC with differential and CMP. 2.   Continue vitamin D supplements.  Her last level was fine in 2019. 3.   Continue to stay active and exercises as tolerated. 4.   Return in about 12 months or sooner if there are new or worsening neurologic symptoms.   Kamya Watling A. Felecia Shelling, MD, PhD 10/03/7865, 67:20 AM Certified in Neurology, Clinical Neurophysiology, Sleep Medicine, Pain Medicine and Neuroimaging  [facility] [address] [place], [place] [address] [phone]

## 2020-03-02 ENCOUNTER — Telehealth: Payer: Self-pay | Admitting: Neurology

## 2020-03-02 LAB — COMPREHENSIVE METABOLIC PANEL
ALT: 14 IU/L (ref 0–32)
AST: 17 IU/L (ref 0–40)
Albumin/Globulin Ratio: 1.5 (ref 1.2–2.2)
Albumin: 4.5 g/dL (ref 3.8–4.9)
Alkaline Phosphatase: 104 IU/L (ref 48–121)
BUN/Creatinine Ratio: 19 (ref 12–28)
BUN: 13 mg/dL (ref 8–27)
Bilirubin Total: 0.4 mg/dL (ref 0.0–1.2)
CO2: 24 mmol/L (ref 20–29)
Calcium: 9.6 mg/dL (ref 8.7–10.3)
Chloride: 103 mmol/L (ref 96–106)
Creatinine, Ser: 0.69 mg/dL (ref 0.57–1.00)
GFR calc Af Amer: 109 mL/min/{1.73_m2} (ref >59)
GFR calc non Af Amer: 95 mL/min/{1.73_m2} (ref >59)
Globulin, Total: 3 g/dL (ref 1.5–4.5)
Glucose: 83 mg/dL (ref 65–99)
Potassium: 4.5 mmol/L (ref 3.5–5.2)
Sodium: 141 mmol/L (ref 134–144)
Total Protein: 7.5 g/dL (ref 6.0–8.5)

## 2020-03-02 LAB — CBC WITH DIFFERENTIAL/PLATELET
Basophils Absolute: 0 10*3/uL (ref 0.0–0.2)
Basos: 1 %
EOS (ABSOLUTE): 0.2 10*3/uL (ref 0.0–0.4)
Eos: 5 %
Hematocrit: 38.1 % (ref 34.0–46.6)
Hemoglobin: 12.8 g/dL (ref 11.1–15.9)
Immature Grans (Abs): 0 10*3/uL (ref 0.0–0.1)
Immature Granulocytes: 0 %
Lymphocytes Absolute: 0.4 10*3/uL — ABNORMAL LOW (ref 0.7–3.1)
Lymphs: 13 %
MCH: 27.6 pg (ref 26.6–33.0)
MCHC: 33.6 g/dL (ref 31.5–35.7)
MCV: 82 fL (ref 79–97)
Monocytes Absolute: 0.4 10*3/uL (ref 0.1–0.9)
Monocytes: 12 %
Neutrophils Absolute: 2.1 10*3/uL (ref 1.4–7.0)
Neutrophils: 69 %
Platelets: 299 10*3/uL (ref 150–450)
RBC: 4.64 x10E6/uL (ref 3.77–5.28)
RDW: 15.4 % (ref 11.7–15.4)
WBC: 3 10*3/uL — ABNORMAL LOW (ref 3.4–10.8)

## 2020-03-02 NOTE — Telephone Encounter (Signed)
[facility] [employer] pt has US imaging they will reach out to the patient to schedule. [place] imag. Ph # [phone] & fax # [phone].

## 2020-03-14 ENCOUNTER — Ambulatory Visit
Admission: RE | Admit: 2020-03-14 | Discharge: 2020-03-14 | Disposition: A | Discharge status: Home / Self Care | Payer: 59 | Source: Ambulatory Visit | Attending: Obstetrics & Gynecology | Admitting: Obstetrics & Gynecology

## 2020-03-14 ENCOUNTER — Other Ambulatory Visit: Payer: Self-pay

## 2020-03-14 DIAGNOSIS — Z1231 Encounter for screening mammogram for malignant neoplasm of breast: Secondary | ICD-10-CM

## 2020-03-15 ENCOUNTER — Other Ambulatory Visit: Payer: Self-pay | Admitting: Neurology

## 2020-03-15 DIAGNOSIS — G35 Multiple sclerosis: Secondary | ICD-10-CM

## 2020-03-16 ENCOUNTER — Other Ambulatory Visit: Payer: Self-pay | Admitting: Obstetrics & Gynecology

## 2020-03-16 DIAGNOSIS — R928 Other abnormal and inconclusive findings on diagnostic imaging of breast: Secondary | ICD-10-CM

## 2020-03-21 ENCOUNTER — Other Ambulatory Visit: Payer: Self-pay

## 2020-03-21 ENCOUNTER — Other Ambulatory Visit: Payer: Self-pay | Admitting: Internal Medicine

## 2020-03-21 ENCOUNTER — Ambulatory Visit (HOSPITAL_BASED_OUTPATIENT_CLINIC_OR_DEPARTMENT_OTHER): Payer: 59 | Admitting: Pharmacist

## 2020-03-21 DIAGNOSIS — Z79899 Other long term (current) drug therapy: Secondary | ICD-10-CM

## 2020-03-21 DIAGNOSIS — G35 Multiple sclerosis: Secondary | ICD-10-CM

## 2020-03-21 MED ORDER — GILENYA 0.5 MG PO CAPS: 1.0000 | ORAL_CAPSULE | Freq: Every day | ORAL | 12 refills | Status: DC

## 2020-03-21 MED FILL — GILENYA 0.5 MG CAPS: 0.5 | 30 days supply | Qty: 30 | Fill #0

## 2020-03-21 NOTE — Progress Notes (Signed)
° °  S: Patient presents today for review of their specialty medication.   Patient is currently taking Gilenya (fingolimod) for multiple sclerosis. Patient is managed by Dr. Felecia Shelling for this. Was last seen earlier this month. No new lesions on MR 01/2018. Clinically, she reported doing well.   Adherence: confirms   Efficacy: reports that this mediation works well.  Dosing: Multiple sclerosis, relapsing: Oral: 0.5 mg once daily  Dosing: Renal Impairment: Adult  There are no dosage adjustments provided in the manufacturer's labeling; use with caution in severe renal impairment (exposure is increased). A small pharmacokinetic study in patients with stable severe impairment (CrCl <30 mL/minute) and not on dialysis suggests that increases in exposure to fingolimod and the active metabolite (fingolimod-P) are not clinically meaningful and dosage adjustment may not be necessary Shanon Brow 2015). Dosing: Hepatic Impairment: Adult  Hepatic impairment prior to treatment initiation: Mild to moderate impairment (Child-Pugh classes A and B): No dosage adjustment necessary. Severe impairment (Child-Pugh class C): There are no dosage adjustments provided in the manufacturer's labeling; use with caution and closely monitor; exposure is doubled in severe hepatic impairment. Hepatotoxicity during treatment:  ALT >3 times ULN and total bilirubin >2 times ULN: Interrupt treatment; permanently discontinue fingolimod if alternative etiology for hepatic injury cannot be identified due to risk of severe drug-induced liver injury.  Current adverse effects: none   Monitoring: S/sx of hepatotoxicity: denies   S/sx of infection: denies  S/sx of cardiovascular effects: denies  S/sx of respiratory effects: denies  S/sx of neurotoxicity: denies  S/sx of hypersensitivity: denies   O:   Lab Results  Component Value Date   WBC 3.0 (L) 03/01/2020   HGB 12.8 03/01/2020   HCT 38.1 03/01/2020   MCV 82 03/01/2020   PLT 299  03/01/2020      Chemistry      Component Value Date/Time   NA 141 03/01/2020 1050   K 4.5 03/01/2020 1050   CL 103 03/01/2020 1050   CO2 24 03/01/2020 1050   BUN 13 03/01/2020 1050   CREATININE 0.69 03/01/2020 1050      Component Value Date/Time   CALCIUM 9.6 03/01/2020 1050   ALKPHOS 104 03/01/2020 1050   AST 17 03/01/2020 1050   ALT 14 03/01/2020 1050   BILITOT 0.4 03/01/2020 1050       A/P: 1. Medication review: Patient currently prescribed Gilenya for multiple sclerosis and is tolerating it well. Reviewed the medication with the patient, including the following: Gilenya is a medication used in the treatment of multiple sclerosis. Administer with or without food. Possible adverse effects are GI upset, increased LFTs, increased risk of infection, headache, cardiovascular effects, hypersensitivity, lymphopenia, increased risk of malignancy, neurotoxicity, Progressive multifocal leukoencephalopathy, and respiratory effects. No recommendations for any changes.  Benard Halsted, PharmD, [place] [phone]

## 2020-03-27 ENCOUNTER — Ambulatory Visit
Admission: RE | Admit: 2020-03-27 | Discharge: 2020-03-27 | Disposition: A | Discharge status: Home / Self Care | Payer: 59 | Source: Ambulatory Visit | Attending: Obstetrics & Gynecology | Admitting: Obstetrics & Gynecology

## 2020-03-27 ENCOUNTER — Other Ambulatory Visit: Payer: Self-pay

## 2020-03-27 DIAGNOSIS — R928 Other abnormal and inconclusive findings on diagnostic imaging of breast: Secondary | ICD-10-CM

## 2020-03-27 DIAGNOSIS — N6001 Solitary cyst of right breast: Secondary | ICD-10-CM | POA: Diagnosis not present

## 2020-04-21 MED FILL — ATORVASTATIN CALCIUM 10 MG: 10 | 90 days supply | Qty: 90 | Fill #1

## 2020-04-27 MED FILL — GILENYA 0.5 MG CAPS: 0.5 | 30 days supply | Qty: 30 | Fill #1

## 2020-05-25 ENCOUNTER — Telehealth: Payer: Self-pay | Admitting: *Deleted

## 2020-05-25 NOTE — Telephone Encounter (Signed)
Submitted PA Gilenya on CMM. Key: [id number]. Waiting on determination from [place].

## 2020-05-25 NOTE — Telephone Encounter (Signed)
Received fax from [place] that [place] approved 05/25/20-05/23/21 for max of 12 fills. PA ref number: [id number]

## 2020-05-26 MED FILL — GILENYA 0.5 MG CAPS: 0.5 | 30 days supply | Qty: 30 | Fill #2

## 2020-06-23 ENCOUNTER — Encounter: Payer: Self-pay | Admitting: Orthopaedic Surgery

## 2020-06-23 ENCOUNTER — Ambulatory Visit (INDEPENDENT_AMBULATORY_CARE_PROVIDER_SITE_OTHER): Payer: 59 | Admitting: Orthopaedic Surgery

## 2020-06-23 ENCOUNTER — Other Ambulatory Visit: Payer: Self-pay

## 2020-06-23 ENCOUNTER — Ambulatory Visit (INDEPENDENT_AMBULATORY_CARE_PROVIDER_SITE_OTHER): Payer: 59

## 2020-06-23 VITALS — Ht 67.0 in | Wt 231.0 lb

## 2020-06-23 DIAGNOSIS — M1711 Unilateral primary osteoarthritis, right knee: Secondary | ICD-10-CM

## 2020-06-23 MED ORDER — LIDOCAINE HCL 1 % IJ SOLN
2.0000 mL | INTRAMUSCULAR | Status: AC | PRN
  Administered 2020-06-23: 2 mL

## 2020-06-23 MED ORDER — BUPIVACAINE HCL 0.5 % IJ SOLN
2.0000 mL | INTRAMUSCULAR | Status: AC | PRN
  Administered 2020-06-23: 2 mL via INTRA_ARTICULAR

## 2020-06-23 MED ORDER — METHYLPREDNISOLONE ACETATE 40 MG/ML IJ SUSP
40.0000 mg | INTRAMUSCULAR | Status: AC | PRN
  Administered 2020-06-23: 40 mg via INTRA_ARTICULAR

## 2020-06-23 NOTE — Addendum Note (Signed)
Addended by: Precious Bard on: 06/23/2020 09:11 AM   Modules accepted: Orders

## 2020-06-23 NOTE — Progress Notes (Signed)
Office Visit Note   Patient: Jill Hall           Date of Birth: [birth year]           MRN: [id number] Visit Date: 06/23/2020              Requested by: Jill Low, MD [place] [address] [place],  [place] [address] PCP: Jill Low, MD   Assessment & Plan: Visit Diagnoses:  1. Primary osteoarthritis of right knee     Plan: Impression is end-stage right knee DJD with varus deformity.  X-rays reviewed with the patient in detail today.  Based on discussion of options she would like to repeat cortisone injection today but she also understands that they are temporary and so she is looking towards January of next year for knee replacement.  Risk benefits rehab recovery all discussed in detail.  I would like her to stop taking the Fingolomid 2 weeks before surgery and 2 weeks after the surgery.  Follow-Up Instructions: Return if symptoms worsen or fail to improve.   Orders:  Orders Placed This Encounter  Procedures   XR KNEE 3 VIEW RIGHT   No orders of the defined types were placed in this encounter.     Procedures: Large Joint Inj: R knee on 06/23/2020 9:08 AM Indications: pain Details: 22 G needle  Arthrogram: No  Medications: 40 mg methylPREDNISolone acetate 40 MG/ML; 2 mL lidocaine 1 %; 2 mL bupivacaine 0.5 % Consent was given by the patient. Patient was prepped and draped in the usual sterile fashion.       Clinical Data: No additional findings.   Subjective: Chief Complaint  Patient presents with   Right Knee - Follow-up    Jill Hall is a 59 year old female here for follow-up of her right knee DJD.  She is requesting another cortisone injection.  She is also interested in exploring knee replacement surgery.  Denies any changes in medical history.  She is currently on fingolimod for MS.  Visco injection did not help.   Review of Systems  Constitutional: Negative.   HENT: Negative.   Eyes: Negative.   Respiratory: Negative.     Cardiovascular: Negative.   Endocrine: Negative.   Musculoskeletal: Negative.   Neurological: Negative.   Hematological: Negative.   Psychiatric/Behavioral: Negative.   All other systems reviewed and are negative.    Objective: Vital Signs: Ht 5\' 7"  (1.702 m)    Wt 231 lb (104.8 kg)    LMP 01/03/2015    BMI 36.18 kg/m   Physical Exam Vitals and nursing note reviewed.  Constitutional:      Appearance: She is well-developed.  Pulmonary:     Effort: Pulmonary effort is normal.  Skin:    General: Skin is warm.     Capillary Refill: Capillary refill takes less than 2 seconds.  Neurological:     Mental Status: She is alert and oriented to person, place, and time.  Psychiatric:        Behavior: Behavior normal.        Thought Content: Thought content normal.        Judgment: Judgment normal.     Ortho Exam Right knee shows a slight varus deformity.  Trace joint effusion.  Painful range of motion with crepitus. Specialty Comments:  No specialty comments available.  Imaging: XR KNEE 3 VIEW RIGHT  Result Date: 06/23/2020 Progression of tricompartmental DJD with varus deformity    PMFS History: Patient Active Problem List  Diagnosis Date Noted   Primary osteoarthritis of right knee 06/23/2020   Vitamin D deficiency 01/29/2016   Optic neuritis 10/24/2014   Other fatigue 10/24/2014   High risk medication use 10/19/2014   Contraception management 08/05/2011   Multiple sclerosis ([place]) 08/26/1997   Past Medical History:  Diagnosis Date   Multiple sclerosis ([place])    Neuromuscular disorder ([place])    Multiple Sclerosis- affects her vision only    Family History  Problem Relation Age of Onset   Breast cancer Mother 77    Past Surgical History:  Procedure Laterality Date   CESAREAN SECTION     UTERINE FIBROID SURGERY     Social History   Occupational History   Not on file  Tobacco Use   Smoking status: Never Smoker   Smokeless tobacco: Never  Used  Substance and Sexual Activity   Alcohol use: No   Drug use: No   Sexual activity: Not on file

## 2020-06-24 LAB — EXTRA LAV TOP TUBE

## 2020-06-24 LAB — PREALBUMIN: Prealbumin: 21 mg/dL (ref 17–34)

## 2020-07-10 ENCOUNTER — Other Ambulatory Visit (HOSPITAL_COMMUNITY): Payer: Self-pay | Admitting: Internal Medicine

## 2020-07-10 DIAGNOSIS — I10 Essential (primary) hypertension: Secondary | ICD-10-CM | POA: Diagnosis not present

## 2020-07-10 DIAGNOSIS — E7800 Pure hypercholesterolemia, unspecified: Secondary | ICD-10-CM | POA: Diagnosis not present

## 2020-07-10 DIAGNOSIS — Z23 Encounter for immunization: Secondary | ICD-10-CM | POA: Diagnosis not present

## 2020-07-10 DIAGNOSIS — Z1389 Encounter for screening for other disorder: Secondary | ICD-10-CM | POA: Diagnosis not present

## 2020-07-10 DIAGNOSIS — G35 Multiple sclerosis: Secondary | ICD-10-CM | POA: Diagnosis not present

## 2020-07-10 DIAGNOSIS — D72819 Decreased white blood cell count, unspecified: Secondary | ICD-10-CM | POA: Diagnosis not present

## 2020-07-10 DIAGNOSIS — Z0000 Encounter for general adult medical examination without abnormal findings: Secondary | ICD-10-CM | POA: Diagnosis not present

## 2020-07-10 DIAGNOSIS — E559 Vitamin D deficiency, unspecified: Secondary | ICD-10-CM | POA: Diagnosis not present

## 2020-07-10 MED FILL — ATORVASTATIN CALCIUM 10 MG: 10 | 90 days supply | Qty: 90 | Fill #0

## 2020-07-10 MED FILL — LOSARTAN POTASSIUM 25 MG TA: 25 | 90 days supply | Qty: 90 | Fill #0

## 2020-07-18 MED FILL — GILENYA 0.5 MG CAPS: 0.5 | 30 days supply | Qty: 30 | Fill #3

## 2020-08-23 MED FILL — GILENYA 0.5 MG CAPS: 0.5 | 30 days supply | Qty: 30 | Fill #4

## 2020-09-25 MED FILL — GILENYA 0.5 MG CAPS: 0.5 | 30 days supply | Qty: 30 | Fill #5

## 2020-10-04 MED FILL — ATORVASTATIN CALCIUM 10 MG: 10 | 90 days supply | Qty: 90 | Fill #1

## 2020-10-04 MED FILL — LOSARTAN POTASSIUM 25 MG TA: 25 | 30 days supply | Qty: 30 | Fill #1

## 2020-10-26 MED FILL — GILENYA 0.5 MG CAPS: 0.5 | 30 days supply | Qty: 30 | Fill #6

## 2020-11-02 MED FILL — LOSARTAN POTASSIUM 25 MG TA: 25 | 30 days supply | Qty: 30 | Fill #2

## 2020-11-17 ENCOUNTER — Other Ambulatory Visit (HOSPITAL_COMMUNITY): Payer: Self-pay

## 2020-11-23 DIAGNOSIS — Z20822 Contact with and (suspected) exposure to covid-19: Secondary | ICD-10-CM | POA: Diagnosis not present

## 2020-11-26 ENCOUNTER — Other Ambulatory Visit (HOSPITAL_COMMUNITY): Payer: Self-pay

## 2020-11-26 MED FILL — Fingolimod HCl Cap 0.5 MG (Base Equiv): ORAL | 30 days supply | Qty: 30 | Fill #0 | Status: AC

## 2020-11-27 ENCOUNTER — Other Ambulatory Visit (HOSPITAL_COMMUNITY): Payer: Self-pay

## 2020-11-28 ENCOUNTER — Other Ambulatory Visit (HOSPITAL_COMMUNITY): Payer: Self-pay

## 2020-11-29 ENCOUNTER — Other Ambulatory Visit: Payer: Self-pay

## 2020-11-29 ENCOUNTER — Ambulatory Visit (INDEPENDENT_AMBULATORY_CARE_PROVIDER_SITE_OTHER): Payer: 59 | Admitting: Orthopaedic Surgery

## 2020-11-29 DIAGNOSIS — M1711 Unilateral primary osteoarthritis, right knee: Secondary | ICD-10-CM | POA: Diagnosis not present

## 2020-11-29 DIAGNOSIS — M1712 Unilateral primary osteoarthritis, left knee: Secondary | ICD-10-CM

## 2020-11-29 MED ORDER — LIDOCAINE HCL 1 % IJ SOLN
2.0000 mL | INTRAMUSCULAR | Status: AC | PRN
  Administered 2020-11-29: 2 mL

## 2020-11-29 MED ORDER — BUPIVACAINE HCL 0.5 % IJ SOLN
2.0000 mL | INTRAMUSCULAR | Status: AC | PRN
  Administered 2020-11-29: 2 mL via INTRA_ARTICULAR

## 2020-11-29 MED ORDER — BUPIVACAINE HCL 0.5 % IJ SOLN
2.0000 mL | INTRAMUSCULAR | Status: AC | PRN
Start: 2020-11-29 — End: 2020-11-29
  Administered 2020-11-29: 2 mL via INTRA_ARTICULAR

## 2020-11-29 MED ORDER — METHYLPREDNISOLONE ACETATE 40 MG/ML IJ SUSP
40.0000 mg | INTRAMUSCULAR | Status: AC | PRN
  Administered 2020-11-29: 40 mg via INTRA_ARTICULAR

## 2020-11-29 MED ORDER — METHYLPREDNISOLONE ACETATE 40 MG/ML IJ SUSP
40.0000 mg | INTRAMUSCULAR | Status: AC | PRN
Start: 2020-11-29 — End: 2020-11-29
  Administered 2020-11-29: 40 mg via INTRA_ARTICULAR

## 2020-11-29 NOTE — Progress Notes (Signed)
Office Visit Note   Patient: Jill Hall           Date of Birth: 02-10-1961           MRN: [id number] Visit Date: 11/29/2020              Requested by: Wenda Low, MD [address] [place],  [place] [address] PCP: Wenda Low, MD   Assessment & Plan: Visit Diagnoses:  1. Primary osteoarthritis of right knee   2. Primary osteoarthritis of left knee     Plan: Impression is end-stage bilateral knee DJD worse on the right.  Both knees injected with cortisone today.  She tolerated these well.  She will follow-up in the near future to schedule knee replacement surgery for sometime in the summer.  Follow-Up Instructions: Return if symptoms worsen or fail to improve.   Orders:  No orders of the defined types were placed in this encounter.  No orders of the defined types were placed in this encounter.     Procedures: Large Joint Inj: bilateral knee on 11/29/2020 9:19 AM Indications: pain Details: 22 G needle  Arthrogram: No  Medications (Right): 2 mL lidocaine 1 %; 2 mL bupivacaine 0.5 %; 40 mg methylPREDNISolone acetate 40 MG/ML Medications (Left): 2 mL lidocaine 1 %; 2 mL bupivacaine 0.5 %; 40 mg methylPREDNISolone acetate 40 MG/ML Outcome: tolerated well, no immediate complications Patient was prepped and draped in the usual sterile fashion.       Clinical Data: No additional findings.   Subjective: Chief Complaint  Patient presents with  . Right Knee - Pain    Jill Hall returns today for follow-up of bilateral knee DJD.  Previous cortisone injection the right knee was about 6 months ago.  She is requesting bilateral knee injections today.  She is considering a right total knee replacement sometime in the summer.  Denies changes otherwise.   Review of Systems  Constitutional: Negative.   HENT: Negative.   Eyes: Negative.   Respiratory: Negative.   Cardiovascular: Negative.   Endocrine: Negative.   Musculoskeletal: Negative.    Neurological: Negative.   Hematological: Negative.   Psychiatric/Behavioral: Negative.   All other systems reviewed and are negative.    Objective: Vital Signs: LMP 01/03/2015   Physical Exam Vitals and nursing note reviewed.  Constitutional:      Appearance: She is well-developed.  Pulmonary:     Effort: Pulmonary effort is normal.  Skin:    General: Skin is warm.     Capillary Refill: Capillary refill takes less than 2 seconds.  Neurological:     Mental Status: She is alert and oriented to person, place, and time.  Psychiatric:        Behavior: Behavior normal.        Thought Content: Thought content normal.        Judgment: Judgment normal.     Ortho Exam Bilateral knees exams that show varus deformity.  Patellofemoral crepitus with range of motion.  Moderate pain with range of motion.  Collaterals and cruciates are stable.  No joint effusion. Specialty Comments:  No specialty comments available.  Imaging: No results found.   PMFS History: Patient Active Problem List   Diagnosis Date Noted  . Primary osteoarthritis of left knee 11/29/2020  . Primary osteoarthritis of right knee 06/23/2020  . Vitamin D deficiency 01/29/2016  . Optic neuritis 10/24/2014  . Other fatigue 10/24/2014  . High risk medication use 10/19/2014  . Contraception management 08/05/2011  .  Multiple sclerosis ([place]) 08/26/1997   Past Medical History:  Diagnosis Date  . Multiple sclerosis ([place])   . Neuromuscular disorder ([place])    Multiple Sclerosis- affects her vision only    Family History  Problem Relation Age of Onset  . Breast cancer Mother 24    Past Surgical History:  Procedure Laterality Date  . CESAREAN SECTION    . UTERINE FIBROID SURGERY     Social History   Occupational History  . Not on file  Tobacco Use  . Smoking status: Never Smoker  . Smokeless tobacco: Never Used  Substance and Sexual Activity  . Alcohol use: No  . Drug use: No  . Sexual activity: Not on  file

## 2020-12-05 ENCOUNTER — Other Ambulatory Visit (HOSPITAL_COMMUNITY): Payer: Self-pay

## 2020-12-05 MED FILL — Losartan Potassium Tab 25 MG: ORAL | 90 days supply | Qty: 90 | Fill #0 | Status: AC

## 2020-12-06 DIAGNOSIS — H52203 Unspecified astigmatism, bilateral: Secondary | ICD-10-CM | POA: Diagnosis not present

## 2020-12-06 DIAGNOSIS — H5213 Myopia, bilateral: Secondary | ICD-10-CM | POA: Diagnosis not present

## 2020-12-07 ENCOUNTER — Other Ambulatory Visit (HOSPITAL_COMMUNITY): Payer: Self-pay

## 2020-12-25 ENCOUNTER — Other Ambulatory Visit (HOSPITAL_COMMUNITY): Payer: Self-pay

## 2020-12-27 ENCOUNTER — Other Ambulatory Visit (HOSPITAL_COMMUNITY): Payer: Self-pay

## 2020-12-27 MED FILL — Fingolimod HCl Cap 0.5 MG (Base Equiv): ORAL | 30 days supply | Qty: 30 | Fill #1 | Status: AC

## 2021-01-01 ENCOUNTER — Other Ambulatory Visit (HOSPITAL_COMMUNITY): Payer: Self-pay

## 2021-01-08 ENCOUNTER — Other Ambulatory Visit (HOSPITAL_COMMUNITY): Payer: Self-pay

## 2021-01-08 DIAGNOSIS — M179 Osteoarthritis of knee, unspecified: Secondary | ICD-10-CM | POA: Diagnosis not present

## 2021-01-08 DIAGNOSIS — G35 Multiple sclerosis: Secondary | ICD-10-CM | POA: Diagnosis not present

## 2021-01-08 DIAGNOSIS — E7800 Pure hypercholesterolemia, unspecified: Secondary | ICD-10-CM | POA: Diagnosis not present

## 2021-01-08 DIAGNOSIS — I10 Essential (primary) hypertension: Secondary | ICD-10-CM | POA: Diagnosis not present

## 2021-01-08 MED ORDER — LOSARTAN POTASSIUM 50 MG PO TABS
50.0000 mg | ORAL_TABLET | Freq: Every day | ORAL | 4 refills | Status: DC
  Filled 2021-01-08: qty 90, 90d supply, fill #0
  Filled 2021-04-23: qty 90, 90d supply, fill #1

## 2021-01-24 ENCOUNTER — Other Ambulatory Visit (HOSPITAL_COMMUNITY): Payer: Self-pay

## 2021-01-29 ENCOUNTER — Other Ambulatory Visit (HOSPITAL_COMMUNITY): Payer: Self-pay

## 2021-01-29 ENCOUNTER — Telehealth: Payer: Self-pay | Admitting: Orthopaedic Surgery

## 2021-01-29 MED FILL — Atorvastatin Calcium Tab 10 MG (Base Equivalent): ORAL | 90 days supply | Qty: 90 | Fill #0 | Status: AC

## 2021-01-29 MED FILL — Fingolimod HCl Cap 0.5 MG (Base Equiv): ORAL | 30 days supply | Qty: 30 | Fill #2 | Status: AC

## 2021-01-29 NOTE — Telephone Encounter (Signed)
Patient would like to proceed with right total knee arthroplasty.  She has chosen a date of 03-19-21 for this procedure. Please provide surgery sheet and advise if any clearance is needed prior to surgery.    Patient would like to know the down time for this surgery.  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    cb  [phone].

## 2021-01-30 ENCOUNTER — Telehealth: Payer: Self-pay | Admitting: Orthopaedic Surgery

## 2021-01-30 ENCOUNTER — Telehealth: Payer: Self-pay

## 2021-01-30 NOTE — Telephone Encounter (Signed)
Clearance request faxed to patient's PCP  Dr Wenda Low for upcoming right total knee arthroplasty with Dr. Erlinda Hong scheduled 03-19-21 at [facility].    Phone [phone] Fax [phone]

## 2021-01-30 NOTE — Telephone Encounter (Signed)
Does Patient need to come in for new updated Xrays. Last xrays were done 06/23/2020.

## 2021-01-30 NOTE — Telephone Encounter (Signed)
Recovery is 3 months.  She needs appt for new xrays.  Thanks.

## 2021-01-30 NOTE — Telephone Encounter (Signed)
yes

## 2021-01-30 NOTE — Telephone Encounter (Signed)
See message below °

## 2021-01-30 NOTE — Telephone Encounter (Signed)
FYI

## 2021-02-02 ENCOUNTER — Telehealth: Payer: Self-pay | Admitting: Orthopaedic Surgery

## 2021-02-02 NOTE — Telephone Encounter (Signed)
Called patient to let her know the down time for right total knee surgery is 3 months.  Also Dr. Erlinda Hong has requested patient to return for new xrays (for right knee).  This can be done as a nurse only visit and then notify x-ray of the appointment.  Patient asked if she could call back to schedule this appointment. Surgery date is set for 03-19-21

## 2021-02-08 DIAGNOSIS — M179 Osteoarthritis of knee, unspecified: Secondary | ICD-10-CM | POA: Diagnosis not present

## 2021-02-08 DIAGNOSIS — E785 Hyperlipidemia, unspecified: Secondary | ICD-10-CM | POA: Diagnosis not present

## 2021-02-08 DIAGNOSIS — Z01818 Encounter for other preprocedural examination: Secondary | ICD-10-CM | POA: Diagnosis not present

## 2021-02-08 DIAGNOSIS — I10 Essential (primary) hypertension: Secondary | ICD-10-CM | POA: Diagnosis not present

## 2021-02-13 ENCOUNTER — Other Ambulatory Visit: Payer: Self-pay

## 2021-02-27 ENCOUNTER — Other Ambulatory Visit (HOSPITAL_COMMUNITY): Payer: Self-pay

## 2021-02-27 MED FILL — Fingolimod HCl Cap 0.5 MG (Base Equiv): ORAL | 30 days supply | Qty: 30 | Fill #3 | Status: AC

## 2021-02-28 ENCOUNTER — Other Ambulatory Visit (HOSPITAL_COMMUNITY): Payer: Self-pay

## 2021-03-07 ENCOUNTER — Encounter: Payer: Self-pay | Admitting: Neurology

## 2021-03-07 ENCOUNTER — Ambulatory Visit (INDEPENDENT_AMBULATORY_CARE_PROVIDER_SITE_OTHER): Payer: 59 | Admitting: Neurology

## 2021-03-07 VITALS — BP 153/89 | HR 84 | Ht 67.0 in | Wt 226.0 lb

## 2021-03-07 DIAGNOSIS — G35 Multiple sclerosis: Secondary | ICD-10-CM

## 2021-03-07 DIAGNOSIS — E559 Vitamin D deficiency, unspecified: Secondary | ICD-10-CM | POA: Diagnosis not present

## 2021-03-07 DIAGNOSIS — Z79899 Other long term (current) drug therapy: Secondary | ICD-10-CM

## 2021-03-07 DIAGNOSIS — Z6836 Body mass index (BMI) 36.0-36.9, adult: Secondary | ICD-10-CM | POA: Diagnosis not present

## 2021-03-07 DIAGNOSIS — R5383 Other fatigue: Secondary | ICD-10-CM

## 2021-03-07 DIAGNOSIS — Z01419 Encounter for gynecological examination (general) (routine) without abnormal findings: Secondary | ICD-10-CM | POA: Diagnosis not present

## 2021-03-07 NOTE — Progress Notes (Signed)
[facility]  PATIENT: Jill Hall DOB: 1960-11-07  REFERRING DOCTOR OR PCP:  Cammi Fulp SOURCE: patient  _________________________________   HISTORICAL  CHIEF COMPLAINT:  Chief Complaint  Patient presents with   Follow-up    RM 1, alone. Last seen 03/01/2020. On gilenya qod. Has surgery on right knee 03-26-21 with Dr. Erlinda Hong (pronounced "choo"). MS stable.     HISTORY OF PRESENT ILLNESS:  Jill Hall is a 60 y.o. woman with relapsing remittingmultiple sclerosis since 1999.   Update 03/07/2021: Her MS has been stable and she has no exacerbations. MRI 01/2018 showed no new lesions.    She has been on Gilenya long term and tolerates it well.    She has no new symptoms.   Gait, strength and sensation are doing well.   Balance is fine.   Her knee limits gait more than MS.  Her knee locks up.    She walks some as exercise daily.    She will exercise more once knee is better..   Bladder is fine.   Vision is doing well.  She denies much fatigue and does all planned tasks.   She sleeps well at night.    Mood and cognition are doing well.   Her daughter is at [employer] and is studyingChemistry.  She got the Covid vaccination.   She will be having a right TKR (Dr. Erlinda Hong) in a few weeks.     She takes Vit D supplements  MS History:    She was diagnosed with MS in 1999 after presenting with optic neuritis.  MRI was consistent with MS and she was started on Betaseron. She did very well on Betaseron for many years.  She tolerated the medication well and had no significant skin issues. She was very compliant with the medication. For the past 15 years, she has had no definite exacerbation. However, on 06/09/2014, she underwent another MRI of the brain and it was  abnormal showing an enhancing focus in the right brachium pontis as well as her known chronic MS plaques. She started Gilenya in February 2016.    Imaging: MRI of the brain 02/11/2018: T2/FLAIR hypertense foci in the right  middle cerebellar peduncle and in the periventricular, juxtacortical and deep white matter of both hemispheres in a pattern and configuration consistent with chronic demyelinating plaque associated with multiple sclerosis.  None of the foci appears to be acute.   When compared to the MRI of the brain dated 08/01/2015, there is no interval change.  MRI of the brain 08/01/2015: No new or progressive lesions. Scattered foci of white matter signal consistent with the clinical diagnosis of multiple sclerosis.  Previously seen active lesion in the middle cerebellar peduncle on the right now appears chronic and inactive.  REVIEW OF SYSTEMS: Constitutional: No fevers, chills, sweats, or change in appetite.  Mild fatigue Eyes: No visual changes, double vision, eye pain Ear, nose and throat: No hearing loss, ear pain, nasal congestion, sore throat Cardiovascular: No chest pain, palpitations Respiratory:  No shortness of breath at rest or with exertion.   No wheezes GastrointestinaI: No nausea, vomiting, diarrhea, abdominal pain, fecal incontinence Genitourinary:  No dysuria, urinary retention or frequency.  No nocturia.   Mild urgency at times Musculoskeletal:  No neck pain, back pain Integumentary: No rash, pruritus, skin lesions Neurological: as above Psychiatric: No depression at this time.  No anxiety Endocrine: No palpitations, diaphoresis, change in appetite, change in weigh or increased thirst Hematologic/Lymphatic:  No anemia,  purpura, petechiae. Allergic/Immunologic: No itchy/runny eyes, nasal congestion, recent allergic reactions, rashes  ALLERGIES: Allergies  Allergen Reactions   Pineapple Swelling    HOME MEDICATIONS:  Current Outpatient Medications:    Ascorbic Acid (VITAMIN C) 100 MG tablet, Take 100 mg by mouth daily., Disp: , Rfl:    atorvastatin (LIPITOR) 10 MG tablet, TAKE 1 TABLET BY MOUTH ONCE DAILY, Disp: 90 tablet, Rfl: 3   Cholecalciferol 2000 UNITS TABS, Take 1 tablet  (2,000 Units total) by mouth daily., Disp: , Rfl:    diclofenac sodium (VOLTAREN) 1 % GEL, Apply 2 g topically 4 (four) times daily., Disp: 1 Tube, Rfl: 5   Ergocalciferol 2000 units TABS, Take 2,000 Units by mouth daily., Disp: 30 tablet, Rfl: 11   Fingolimod HCl 0.5 MG CAPS, TAKE 1 CAPSULE (0.5 MG TOTAL) BY MOUTH DAILY., Disp: 30 capsule, Rfl: 12   losartan (COZAAR) 50 MG tablet, Take 1 tablet (50 mg total) by mouth daily., Disp: 90 tablet, Rfl: 4   Multiple Vitamin (MULTIVITAMIN WITH MINERALS) TABS, Take 1 tablet by mouth daily., Disp: , Rfl:    losartan (COZAAR) 25 MG tablet, TAKE 1 TABLET BY MOUTH ONCE DAILY (Patient not taking: Reported on 03/07/2021), Disp: 90 tablet, Rfl: 3  PAST MEDICAL HISTORY: Past Medical History:  Diagnosis Date   Multiple sclerosis ([place])    Neuromuscular disorder ([place])    Multiple Sclerosis- affects her vision only    PAST SURGICAL HISTORY: Past Surgical History:  Procedure Laterality Date   CESAREAN SECTION     UTERINE FIBROID SURGERY      FAMILY HISTORY: Family History  Problem Relation Age of Onset   Breast cancer Mother 80    SOCIAL HISTORY:  Social History   Socioeconomic History   Marital status: Married    Spouse name: Not on file   Number of children: Not on file   Years of education: Not on file   Highest education level: Not on file  Occupational History   Not on file  Tobacco Use   Smoking status: Never   Smokeless tobacco: Never  Substance and Sexual Activity   Alcohol use: No   Drug use: No   Sexual activity: Not on file  Other Topics Concern   Not on file  Social History Narrative   Not on file   Social Determinants of Health   Financial Resource Strain: Not on file  Food Insecurity: Not on file  Transportation Needs: Not on file  Physical Activity: Not on file  Stress: Not on file  Social Connections: Not on file  Intimate Partner Violence: Not on file     PHYSICAL EXAM  Vitals:   03/07/21 0814  BP: (!)  153/89  Pulse: 84  Weight: 226 lb (102.5 kg)  Height: 5\' 7"  (1.702 m)    Body mass index is 35.4 kg/m.   General: The patient is well-developed and well-nourished and in no acute distress  Neurologic Exam  Mental status: The patient is alert and oriented x 3 at the time of the examination. The patient has apparent normal recent and remote memory, with an apparently normal attention span and concentration ability.   Speech is normal.  Cranial nerves: Extraocular movements are full.  Facial strength and sensation is normal.  Trapezius strength is normal. Hearing seems normal.   Motor:  Muscle bulk is normal.   Muscle tone is normal. Strength is 5/5 in the arms and legs..   Sensory: Intact sensation to  soft touch and  vibration sensation in all 4 extremities.  Coordination: Cerebellar testing shows good finger-nose-finger.  Gait and station: Station is normal.   She has an arthritic gait.  Tandem gait is mildly wide.  The Romberg is negative..   Reflexes: Deep tendon reflexes are symmetric and normal bilaterally.       DIAGNOSTIC DATA (LABS, IMAGING, TESTING) - I reviewed patient records, labs, notes, testing and imaging myself where available.  Lab Results  Component Value Date   WBC 3.0 (L) 03/01/2020   HGB 12.8 03/01/2020   HCT 38.1 03/01/2020   MCV 82 03/01/2020   PLT 299 03/01/2020      Component Value Date/Time   NA 141 03/01/2020 1050   K 4.5 03/01/2020 1050   CL 103 03/01/2020 1050   CO2 24 03/01/2020 1050   GLUCOSE 83 03/01/2020 1050   GLUCOSE 101 (H) 04/15/2013 0212   BUN 13 03/01/2020 1050   CREATININE 0.69 03/01/2020 1050   CALCIUM 9.6 03/01/2020 1050   PROT 7.5 03/01/2020 1050   ALBUMIN 4.5 03/01/2020 1050   AST 17 03/01/2020 1050   ALT 14 03/01/2020 1050   ALKPHOS 104 03/01/2020 1050   BILITOT 0.4 03/01/2020 1050   GFRNONAA 95 03/01/2020 1050   GFRAA 109 03/01/2020 1050     ASSESSMENT AND PLAN   1. Multiple sclerosis ([place])   2. High risk  medication use   3. Other fatigue   4. Vitamin D deficiency      1.   Continue Gilenya every other day.  She had labs with Dr. Lysle Rubens at [facility].    2.   Continue vitamin D supplements.  3.   Continue to stay active and exercises as tolerated. 4.   Return in about 12 months or sooner if there are new or worsening neurologic symptoms.   Sheridan Gettel A. Felecia Shelling, MD, PhD 6/38/1771, 1:65 AM Certified in Neurology, Clinical Neurophysiology, Sleep Medicine, Pain Medicine and Neuroimaging  [facility] [address], [place], [place] [address] [phone]

## 2021-03-08 ENCOUNTER — Ambulatory Visit (INDEPENDENT_AMBULATORY_CARE_PROVIDER_SITE_OTHER): Payer: 59 | Admitting: Orthopaedic Surgery

## 2021-03-08 ENCOUNTER — Ambulatory Visit (INDEPENDENT_AMBULATORY_CARE_PROVIDER_SITE_OTHER): Payer: 59

## 2021-03-08 DIAGNOSIS — Z01419 Encounter for gynecological examination (general) (routine) without abnormal findings: Secondary | ICD-10-CM | POA: Diagnosis not present

## 2021-03-08 DIAGNOSIS — M1711 Unilateral primary osteoarthritis, right knee: Secondary | ICD-10-CM

## 2021-03-09 DIAGNOSIS — I10 Essential (primary) hypertension: Secondary | ICD-10-CM | POA: Diagnosis not present

## 2021-03-09 DIAGNOSIS — M179 Osteoarthritis of knee, unspecified: Secondary | ICD-10-CM | POA: Diagnosis not present

## 2021-03-13 ENCOUNTER — Other Ambulatory Visit: Payer: Self-pay | Admitting: Physician Assistant

## 2021-03-13 ENCOUNTER — Other Ambulatory Visit (HOSPITAL_COMMUNITY): Payer: Self-pay

## 2021-03-13 ENCOUNTER — Telehealth: Payer: Self-pay | Admitting: Orthopaedic Surgery

## 2021-03-13 MED ORDER — DOCUSATE SODIUM 100 MG PO CAPS
100.0000 mg | ORAL_CAPSULE | Freq: Every day | ORAL | 2 refills | Status: DC | PRN
  Filled 2021-03-13: qty 30, 30d supply, fill #0

## 2021-03-13 MED ORDER — ONDANSETRON HCL 4 MG PO TABS
4.0000 mg | ORAL_TABLET | Freq: Three times a day (TID) | ORAL | 0 refills | Status: DC | PRN
  Filled 2021-03-13: qty 40, 14d supply, fill #0

## 2021-03-13 MED ORDER — METHOCARBAMOL 500 MG PO TABS
500.0000 mg | ORAL_TABLET | Freq: Two times a day (BID) | ORAL | 0 refills | Status: DC | PRN
  Filled 2021-03-13: qty 20, 10d supply, fill #0

## 2021-03-13 MED ORDER — ASPIRIN EC 81 MG PO TBEC
81.0000 mg | DELAYED_RELEASE_TABLET | Freq: Two times a day (BID) | ORAL | 0 refills | Status: DC
  Filled 2021-03-13: qty 84, 42d supply, fill #0

## 2021-03-13 MED ORDER — OXYCODONE-ACETAMINOPHEN 5-325 MG PO TABS
1.0000 | ORAL_TABLET | Freq: Four times a day (QID) | ORAL | 0 refills | Status: DC | PRN
  Filled 2021-03-13: qty 40, 5d supply, fill #0

## 2021-03-13 NOTE — Telephone Encounter (Signed)
Pt. Submitted medical release form, short term disability, and $25.00 check. Accepted 03/13/21

## 2021-03-13 NOTE — Progress Notes (Signed)
Surgical Instructions    Your procedure is scheduled on Monday, 25th, 2022.  Report to [facility] Main Entrance "A" at 08:00 A.M., then check in with the Admitting office.  Call this number if you have problems the morning of surgery:  [phone]   If you have any questions prior to your surgery date call [phone]: Open Monday-Friday 8am-4pm    Remember:  Do not eat after midnight the night before your surgery  You may drink clear liquids until 07:00 the morning of your surgery.   Clear liquids allowed are: Water, Non-Citrus Juices (without pulp), Carbonated Beverages, Clear Tea, Black Coffee Only, and Gatorade Patient Instructions  The night before surgery:  No food after midnight. ONLY clear liquids after midnight  The day of surgery (if you do NOT have diabetes):  Drink ONE (1) Pre-Surgery Clear Ensure by 0700 the morning of surgery. Drink in one sitting. Do not sip.  This drink was given to you during your hospital  pre-op appointment visit.  Nothing else to drink after completing the  Pre-Surgery Clear Ensure.         If you have questions, please contact your surgeon's office.     Take these medicines the morning of surgery with A SIP OF WATER:  atorvastatin (LIPITOR)   Ask the doctor if you need to stop Fingolimod HCl before surgery.  As of today, STOP taking any Aspirin (unless otherwise instructed by your surgeon) Aleve, Naproxen, Ibuprofen, Motrin, Advil, Goody's, BC's, all herbal medications, fish oil, and all vitamins.          Do not wear jewelry or makeup Do not wear lotions, powders, perfumes, or deodorant. Do not shave 48 hours prior to surgery. Do not bring valuables to the hospital. DO Not wear nail polish, gel polish, artificial nails, or any other type of covering on natural nails including finger and toenails. If patients have artificial nails, gel coating, etc. that need to be removed by a nail salon please have this removed prior to surgery or  surgery may need to be canceled/delayed if the surgeon/ anesthesia feels like the patient is unable to be adequately monitored.             [facility] is not responsible for any belongings or valuables.  Do NOT Smoke (Tobacco/Vaping) or drink Alcohol 24 hours prior to your procedure If you use a CPAP at night, you may bring all equipment for your overnight stay.   Contacts, glasses, dentures or bridgework may not be worn into surgery, please bring cases for these belongings   For patients admitted to the hospital, discharge time will be determined by your treatment team.   Patients discharged the day of surgery will not be allowed to drive home, and someone needs to stay with them for 24 hours.  ONLY 1 SUPPORT PERSON MAY BE PRESENT WHILE YOU ARE IN SURGERY. IF YOU ARE TO BE ADMITTED ONCE YOU ARE IN YOUR ROOM YOU WILL BE ALLOWED TWO (2) VISITORS.  Minor children may have two parents present. Special consideration for safety and communication needs will be reviewed on a case by case basis.  Special instructions:    Oral Hygiene is also important to reduce your risk of infection.  Remember - BRUSH YOUR TEETH THE MORNING OF SURGERY WITH YOUR REGULAR TOOTHPASTE   [facility]- Preparing For Surgery  Before surgery, you can play an important role. Because skin is not sterile, your skin needs to be as free of germs as  possible. You can reduce the number of germs on your skin by washing with CHG (chlorahexidine gluconate) Soap before surgery.  CHG is an antiseptic cleaner which kills germs and bonds with the skin to continue killing germs even after washing.     Please do not use if you have an allergy to CHG or antibacterial soaps. If your skin becomes reddened/irritated stop using the CHG.  Do not shave (including legs and underarms) for at least 48 hours prior to first CHG shower. It is OK to shave your face.  Please follow these instructions carefully.     Shower the NIGHT BEFORE SURGERY  and the MORNING OF SURGERY with CHG Soap.   If you chose to wash your hair, wash your hair first as usual with your normal shampoo. After you shampoo, rinse your hair and body thoroughly to remove the shampoo.  Then [employer] and genitals (private parts) with your normal soap and rinse thoroughly to remove soap.  After that Use CHG Soap as you would any other liquid soap. You can apply CHG directly to the skin and wash gently with a scrungie or a clean washcloth.   Apply the CHG Soap to your body ONLY FROM THE NECK DOWN.  Do not use on open wounds or open sores. Avoid contact with your eyes, ears, mouth and genitals (private parts). Wash Face and genitals (private parts)  with your normal soap.   Wash thoroughly, paying special attention to the area where your surgery will be performed.  Thoroughly rinse your body with warm water from the neck down.  DO NOT shower/wash with your normal soap after using and rinsing off the CHG Soap.  Pat yourself dry with a CLEAN TOWEL.  Wear CLEAN PAJAMAS to bed the night before surgery  Place CLEAN SHEETS on your bed the night before your surgery  DO NOT SLEEP WITH PETS.   Day of Surgery:  Take a shower with CHG soap. Wear Clean/Comfortable clothing the morning of surgery Do not apply any deodorants/lotions.   Remember to brush your teeth WITH YOUR REGULAR TOOTHPASTE.   Please read over the following fact sheets that you were given.

## 2021-03-14 ENCOUNTER — Encounter (HOSPITAL_COMMUNITY)
Admission: RE | Admit: 2021-03-14 | Discharge: 2021-03-14 | Disposition: A | Discharge status: Home / Self Care | Payer: 59 | Source: Ambulatory Visit | Attending: Orthopaedic Surgery | Admitting: Orthopaedic Surgery

## 2021-03-14 ENCOUNTER — Encounter (HOSPITAL_COMMUNITY): Payer: Self-pay

## 2021-03-14 ENCOUNTER — Ambulatory Visit (HOSPITAL_COMMUNITY)
Admission: RE | Admit: 2021-03-14 | Discharge: 2021-03-14 | Disposition: A | Discharge status: Home / Self Care | Payer: 59 | Source: Ambulatory Visit | Attending: Physician Assistant | Admitting: Physician Assistant

## 2021-03-14 ENCOUNTER — Telehealth: Payer: Self-pay | Admitting: Radiology

## 2021-03-14 ENCOUNTER — Other Ambulatory Visit: Payer: Self-pay

## 2021-03-14 DIAGNOSIS — M1711 Unilateral primary osteoarthritis, right knee: Secondary | ICD-10-CM | POA: Insufficient documentation

## 2021-03-14 DIAGNOSIS — Z01818 Encounter for other preprocedural examination: Secondary | ICD-10-CM | POA: Diagnosis not present

## 2021-03-14 HISTORY — DX: Unspecified osteoarthritis, unspecified site: M19.90

## 2021-03-14 HISTORY — DX: Essential (primary) hypertension: I10

## 2021-03-14 LAB — CBC WITH DIFFERENTIAL/PLATELET
Abs Immature Granulocytes: 0.02 10*3/uL (ref 0.00–0.07)
Basophils Absolute: 0 10*3/uL (ref 0.0–0.1)
Basophils Relative: 1 %
Eosinophils Absolute: 0.1 10*3/uL (ref 0.0–0.5)
Eosinophils Relative: 4 %
HCT: 38.8 % (ref 36.0–46.0)
Hemoglobin: 12.4 g/dL (ref 12.0–15.0)
Immature Granulocytes: 1 %
Lymphocytes Relative: 12 %
Lymphs Abs: 0.4 10*3/uL — ABNORMAL LOW (ref 0.7–4.0)
MCH: 27.4 pg (ref 26.0–34.0)
MCHC: 32 g/dL (ref 30.0–36.0)
MCV: 85.8 fL (ref 80.0–100.0)
Monocytes Absolute: 0.4 10*3/uL (ref 0.1–1.0)
Monocytes Relative: 12 %
Neutro Abs: 2.4 10*3/uL (ref 1.7–7.7)
Neutrophils Relative %: 70 %
Platelets: 264 10*3/uL (ref 150–400)
RBC: 4.52 MIL/uL (ref 3.87–5.11)
RDW: 15.7 % — ABNORMAL HIGH (ref 11.5–15.5)
WBC: 3.3 10*3/uL — ABNORMAL LOW (ref 4.0–10.5)
nRBC: 0 % (ref 0.0–0.2)

## 2021-03-14 LAB — URINALYSIS, ROUTINE W REFLEX MICROSCOPIC
Bilirubin Urine: NEGATIVE
Glucose, UA: NEGATIVE mg/dL
Hgb urine dipstick: NEGATIVE
Ketones, ur: NEGATIVE mg/dL
Leukocytes,Ua: NEGATIVE
Nitrite: NEGATIVE
Protein, ur: NEGATIVE mg/dL
Specific Gravity, Urine: 1.015 (ref 1.005–1.030)
pH: 7 (ref 5.0–8.0)

## 2021-03-14 LAB — PROTIME-INR
INR: 1 (ref 0.8–1.2)
Prothrombin Time: 13.4 seconds (ref 11.4–15.2)

## 2021-03-14 LAB — COMPREHENSIVE METABOLIC PANEL
ALT: 17 U/L (ref 0–44)
AST: 18 U/L (ref 15–41)
Albumin: 3.8 g/dL (ref 3.5–5.0)
Alkaline Phosphatase: 85 U/L (ref 38–126)
Anion gap: 9 (ref 5–15)
BUN: 10 mg/dL (ref 8–23)
CO2: 25 mmol/L (ref 22–32)
Calcium: 9.1 mg/dL (ref 8.9–10.3)
Chloride: 106 mmol/L (ref 98–111)
Creatinine, Ser: 0.66 mg/dL (ref 0.44–1.00)
GFR, Estimated: >60 mL/min (ref >60)
Glucose, Bld: 87 mg/dL (ref 70–99)
Potassium: 3.7 mmol/L (ref 3.5–5.1)
Sodium: 140 mmol/L (ref 135–145)
Total Bilirubin: 0.7 mg/dL (ref 0.3–1.2)
Total Protein: 7.3 g/dL (ref 6.5–8.1)

## 2021-03-14 LAB — SURGICAL PCR SCREEN
MRSA, PCR: NEGATIVE
Staphylococcus aureus: NEGATIVE

## 2021-03-14 LAB — TYPE AND SCREEN
ABO/RH(D): O POS
Antibody Screen: NEGATIVE

## 2021-03-14 LAB — APTT: aPTT: 38 seconds — ABNORMAL HIGH (ref 24–36)

## 2021-03-14 NOTE — Telephone Encounter (Signed)
Ok thanks 

## 2021-03-14 NOTE — Progress Notes (Signed)
Dr. Lysle Rubens, I just want to let you know about this chest x-ray finding that was found incidentally on her preoperative work-up.  I called her tonight and she is asymptomatic and she would like you to follow-up with her regarding what to do next.  She would still like to go through with knee replacement surgery this coming Monday.  She stated that she has an appointment with you for an annual physical in November.  Thank you.

## 2021-03-14 NOTE — Progress Notes (Signed)
Dr. Erlinda Hong notified via voicemail and staff message of Chest x-ray results.

## 2021-03-14 NOTE — Progress Notes (Signed)
PCP - Jill Hall Cardiologist - denies Neurologist: Dr. Arlice Colt  PPM/ICD - denies   Chest x-ray - 03/14/21 EKG - 03/14/21 Stress Test - denies ECHO - denies Cardiac Cath - denies  Sleep Study - denies  No diabetes  Ask the doctor if you need to stop Fingolimod HCl before surgery.   As of today, STOP taking any Aspirin (unless otherwise instructed by your surgeon) Aleve, Naproxen, Ibuprofen, Motrin, Advil, Goody's, BC's, all herbal medications, fish oil, and all vitamins.  ERAS Protcol -yes PRE-SURGERY Ensure or G2- ensure given  COVID TEST- 03/14/21 in PAT   Anesthesia review: yes, history of Multiple sclerosis. Advised patient to call dr. Felecia Shelling and dr. Erlinda Hong for further instructions on Fingolimod  Patient denies shortness of breath, fever, cough and chest pain at PAT appointment   All instructions explained to the patient, with a verbal understanding of the material. Patient agrees to go over the instructions while at home for a better understanding. Patient also instructed to self quarantine after being tested for COVID-19. The opportunity to ask questions was provided.

## 2021-03-14 NOTE — Telephone Encounter (Signed)
[facility] called---please she patient Chest Xray report in her chart.

## 2021-03-14 NOTE — Progress Notes (Signed)
Can you send a copy of chest xray to patient and her pcp?  Also, have her fu with pcp for this

## 2021-03-15 ENCOUNTER — Other Ambulatory Visit (HOSPITAL_COMMUNITY)
Admission: RE | Admit: 2021-03-15 | Discharge: 2021-03-15 | Disposition: A | Discharge status: Home / Self Care | Payer: 59 | Source: Ambulatory Visit | Attending: Orthopaedic Surgery | Admitting: Orthopaedic Surgery

## 2021-03-15 DIAGNOSIS — Z20822 Contact with and (suspected) exposure to covid-19: Secondary | ICD-10-CM | POA: Insufficient documentation

## 2021-03-15 DIAGNOSIS — Z01812 Encounter for preprocedural laboratory examination: Secondary | ICD-10-CM | POA: Diagnosis not present

## 2021-03-15 LAB — SARS CORONAVIRUS 2 (TAT 6-24 HRS): SARS Coronavirus 2: NEGATIVE

## 2021-03-16 ENCOUNTER — Telehealth: Payer: Self-pay | Admitting: Orthopaedic Surgery

## 2021-03-16 NOTE — Telephone Encounter (Signed)
[place] forms received via fax. To [employer].

## 2021-03-19 ENCOUNTER — Ambulatory Visit (HOSPITAL_COMMUNITY): Payer: 59 | Admitting: Anesthesiology

## 2021-03-19 ENCOUNTER — Ambulatory Visit (HOSPITAL_COMMUNITY): Payer: 59

## 2021-03-19 ENCOUNTER — Encounter (HOSPITAL_COMMUNITY)
Admission: RE | Disposition: A | Discharge status: Home / Self Care | Payer: Self-pay | Source: Home / Self Care | Attending: Orthopaedic Surgery

## 2021-03-19 ENCOUNTER — Other Ambulatory Visit: Payer: Self-pay

## 2021-03-19 ENCOUNTER — Observation Stay (HOSPITAL_COMMUNITY)
Admission: RE | Admit: 2021-03-19 | Discharge: 2021-03-20 | Disposition: A | Discharge status: Home / Self Care | Payer: 59 | Attending: Orthopaedic Surgery | Admitting: Orthopaedic Surgery

## 2021-03-19 ENCOUNTER — Ambulatory Visit (HOSPITAL_COMMUNITY): Payer: 59 | Admitting: Physician Assistant

## 2021-03-19 ENCOUNTER — Encounter (HOSPITAL_COMMUNITY): Payer: Self-pay | Admitting: Orthopaedic Surgery

## 2021-03-19 DIAGNOSIS — M21161 Varus deformity, not elsewhere classified, right knee: Secondary | ICD-10-CM | POA: Diagnosis not present

## 2021-03-19 DIAGNOSIS — E559 Vitamin D deficiency, unspecified: Secondary | ICD-10-CM | POA: Diagnosis not present

## 2021-03-19 DIAGNOSIS — G35 Multiple sclerosis: Secondary | ICD-10-CM | POA: Diagnosis not present

## 2021-03-19 DIAGNOSIS — Z7982 Long term (current) use of aspirin: Secondary | ICD-10-CM | POA: Insufficient documentation

## 2021-03-19 DIAGNOSIS — R52 Pain, unspecified: Secondary | ICD-10-CM

## 2021-03-19 DIAGNOSIS — M1711 Unilateral primary osteoarthritis, right knee: Principal | ICD-10-CM | POA: Diagnosis present

## 2021-03-19 DIAGNOSIS — G8918 Other acute postprocedural pain: Secondary | ICD-10-CM | POA: Diagnosis not present

## 2021-03-19 DIAGNOSIS — I10 Essential (primary) hypertension: Secondary | ICD-10-CM | POA: Diagnosis not present

## 2021-03-19 DIAGNOSIS — Z96651 Presence of right artificial knee joint: Secondary | ICD-10-CM | POA: Diagnosis not present

## 2021-03-19 DIAGNOSIS — Z79899 Other long term (current) drug therapy: Secondary | ICD-10-CM | POA: Insufficient documentation

## 2021-03-19 HISTORY — PX: TOTAL KNEE ARTHROPLASTY: SHX125

## 2021-03-19 LAB — ABO/RH: ABO/RH(D): O POS

## 2021-03-19 SURGERY — ARTHROPLASTY, KNEE, TOTAL
Anesthesia: General | Site: Knee | Laterality: Right

## 2021-03-19 MED ORDER — ACETAMINOPHEN 500 MG PO TABS
1000.0000 mg | ORAL_TABLET | Freq: Four times a day (QID) | ORAL | Status: AC
  Administered 2021-03-19 – 2021-03-20 (×4): 1000 mg via ORAL
  Filled 2021-03-19 (×4): qty 2

## 2021-03-19 MED ORDER — POVIDONE-IODINE 10 % EX SWAB
2.0000 "application " | Freq: Once | CUTANEOUS | Status: AC
  Administered 2021-03-19: 2 via TOPICAL

## 2021-03-19 MED ORDER — ASPIRIN 81 MG PO CHEW
81.0000 mg | CHEWABLE_TABLET | Freq: Two times a day (BID) | ORAL | Status: DC
  Administered 2021-03-19 – 2021-03-20 (×2): 81 mg via ORAL
  Filled 2021-03-19 (×2): qty 1

## 2021-03-19 MED ORDER — CEFAZOLIN SODIUM-DEXTROSE 2-4 GM/100ML-% IV SOLN
2.0000 g | INTRAVENOUS | Status: AC
Start: 2021-03-19 — End: 2021-03-19
  Administered 2021-03-19: 2 g via INTRAVENOUS
  Filled 2021-03-19: qty 100

## 2021-03-19 MED ORDER — AMISULPRIDE (ANTIEMETIC) 5 MG/2ML IV SOLN: 10.0000 mg | Freq: Once | INTRAVENOUS | Status: DC | PRN

## 2021-03-19 MED ORDER — TRANEXAMIC ACID-NACL 1000-0.7 MG/100ML-% IV SOLN
1000.0000 mg | Freq: Once | INTRAVENOUS | Status: AC
  Administered 2021-03-19: 1000 mg via INTRAVENOUS
  Filled 2021-03-19: qty 100

## 2021-03-19 MED ORDER — MIDAZOLAM HCL 2 MG/2ML IJ SOLN
INTRAMUSCULAR | Status: AC
  Filled 2021-03-19: qty 2

## 2021-03-19 MED ORDER — TRANEXAMIC ACID 1000 MG/10ML IV SOLN
2000.0000 mg | INTRAVENOUS | Status: DC
  Filled 2021-03-19: qty 20

## 2021-03-19 MED ORDER — FENTANYL CITRATE (PF) 250 MCG/5ML IJ SOLN
INTRAMUSCULAR | Status: DC | PRN
  Administered 2021-03-19 (×5): 50 ug via INTRAVENOUS

## 2021-03-19 MED ORDER — OXYCODONE HCL 5 MG PO TABS
5.0000 mg | ORAL_TABLET | ORAL | Status: DC | PRN
  Administered 2021-03-19: 10 mg via ORAL

## 2021-03-19 MED ORDER — ACETAMINOPHEN 160 MG/5ML PO SOLN: 325.0000 mg | Freq: Once | ORAL | Status: DC | PRN

## 2021-03-19 MED ORDER — FENTANYL CITRATE (PF) 250 MCG/5ML IJ SOLN
INTRAMUSCULAR | Status: AC
  Filled 2021-03-19: qty 5

## 2021-03-19 MED ORDER — METHOCARBAMOL 500 MG PO TABS
500.0000 mg | ORAL_TABLET | Freq: Four times a day (QID) | ORAL | Status: DC | PRN
  Administered 2021-03-19: 500 mg via ORAL

## 2021-03-19 MED ORDER — CEFAZOLIN SODIUM-DEXTROSE 2-4 GM/100ML-% IV SOLN
2.0000 g | Freq: Four times a day (QID) | INTRAVENOUS | Status: AC
  Administered 2021-03-19 (×2): 2 g via INTRAVENOUS
  Filled 2021-03-19 (×2): qty 100

## 2021-03-19 MED ORDER — BUPIVACAINE-EPINEPHRINE (PF) 0.25% -1:200000 IJ SOLN
INTRAMUSCULAR | Status: DC | PRN
  Administered 2021-03-19: 30 mL via PERINEURAL

## 2021-03-19 MED ORDER — DEXAMETHASONE SODIUM PHOSPHATE 10 MG/ML IJ SOLN
10.0000 mg | Freq: Once | INTRAMUSCULAR | Status: AC
  Administered 2021-03-20: 10 mg via INTRAVENOUS
  Filled 2021-03-19: qty 1

## 2021-03-19 MED ORDER — SODIUM CHLORIDE 0.9 % IV SOLN: INTRAVENOUS | Status: DC

## 2021-03-19 MED ORDER — TRANEXAMIC ACID-NACL 1000-0.7 MG/100ML-% IV SOLN
1000.0000 mg | INTRAVENOUS | Status: AC
  Administered 2021-03-19: 1000 mg via INTRAVENOUS
  Filled 2021-03-19 (×2): qty 100

## 2021-03-19 MED ORDER — LACTATED RINGERS IV SOLN: INTRAVENOUS | Status: DC

## 2021-03-19 MED ORDER — METHOCARBAMOL 500 MG PO TABS
ORAL_TABLET | ORAL | Status: AC
  Filled 2021-03-19: qty 1

## 2021-03-19 MED ORDER — OXYCODONE HCL 5 MG PO TABS
10.0000 mg | ORAL_TABLET | ORAL | Status: DC | PRN
  Administered 2021-03-19 – 2021-03-20 (×4): 10 mg via ORAL
  Filled 2021-03-19 (×3): qty 2
  Filled 2021-03-19: qty 3

## 2021-03-19 MED ORDER — BUPIVACAINE-MELOXICAM ER 400-12 MG/14ML IJ SOLN
INTRAMUSCULAR | Status: AC
  Filled 2021-03-19: qty 1

## 2021-03-19 MED ORDER — PROPOFOL 500 MG/50ML IV EMUL
INTRAVENOUS | Status: DC | PRN
  Administered 2021-03-19: 50 ug/kg/min via INTRAVENOUS

## 2021-03-19 MED ORDER — HYDROMORPHONE HCL 1 MG/ML IJ SOLN
0.5000 mg | INTRAMUSCULAR | Status: DC | PRN
  Administered 2021-03-19: 1 mg via INTRAVENOUS
  Filled 2021-03-19: qty 1

## 2021-03-19 MED ORDER — PROPOFOL 10 MG/ML IV BOLUS
INTRAVENOUS | Status: AC
  Filled 2021-03-19: qty 20

## 2021-03-19 MED ORDER — ACETAMINOPHEN 325 MG PO TABS: 325.0000 mg | ORAL_TABLET | Freq: Four times a day (QID) | ORAL | Status: DC | PRN

## 2021-03-19 MED ORDER — PHENOL 1.4 % MT LIQD: 1.0000 | OROMUCOSAL | Status: DC | PRN

## 2021-03-19 MED ORDER — CHLORHEXIDINE GLUCONATE 0.12 % MT SOLN
15.0000 mL | Freq: Once | OROMUCOSAL | Status: AC
  Administered 2021-03-19: 15 mL via OROMUCOSAL
  Filled 2021-03-19: qty 15

## 2021-03-19 MED ORDER — PROMETHAZINE HCL 25 MG/ML IJ SOLN: 6.2500 mg | INTRAMUSCULAR | Status: DC | PRN

## 2021-03-19 MED ORDER — BUPIVACAINE IN DEXTROSE 0.75-8.25 % IT SOLN
INTRATHECAL | Status: DC | PRN
  Administered 2021-03-19: 1.6 mL via INTRATHECAL

## 2021-03-19 MED ORDER — HYDROMORPHONE HCL 1 MG/ML IJ SOLN: 0.2500 mg | INTRAMUSCULAR | Status: DC | PRN

## 2021-03-19 MED ORDER — METOCLOPRAMIDE HCL 5 MG PO TABS: 5.0000 mg | ORAL_TABLET | Freq: Three times a day (TID) | ORAL | Status: DC | PRN

## 2021-03-19 MED ORDER — MIDAZOLAM HCL 5 MG/5ML IJ SOLN
INTRAMUSCULAR | Status: DC | PRN
  Administered 2021-03-19 (×2): 1 mg via INTRAVENOUS

## 2021-03-19 MED ORDER — MENTHOL 3 MG MT LOZG: 1.0000 | LOZENGE | OROMUCOSAL | Status: DC | PRN

## 2021-03-19 MED ORDER — ACETAMINOPHEN 10 MG/ML IV SOLN
INTRAVENOUS | Status: AC
  Filled 2021-03-19: qty 100

## 2021-03-19 MED ORDER — ACETAMINOPHEN 10 MG/ML IV SOLN
1000.0000 mg | Freq: Once | INTRAVENOUS | Status: DC | PRN
  Administered 2021-03-19: 1000 mg via INTRAVENOUS

## 2021-03-19 MED ORDER — METOCLOPRAMIDE HCL 5 MG/ML IJ SOLN: 5.0000 mg | Freq: Three times a day (TID) | INTRAMUSCULAR | Status: DC | PRN

## 2021-03-19 MED ORDER — ONDANSETRON HCL 4 MG/2ML IJ SOLN
INTRAMUSCULAR | Status: DC | PRN
  Administered 2021-03-19: 4 mg via INTRAVENOUS

## 2021-03-19 MED ORDER — ONDANSETRON HCL 4 MG/2ML IJ SOLN
4.0000 mg | Freq: Four times a day (QID) | INTRAMUSCULAR | Status: DC | PRN
  Administered 2021-03-19: 4 mg via INTRAVENOUS
  Filled 2021-03-19: qty 2

## 2021-03-19 MED ORDER — VANCOMYCIN HCL 1000 MG IV SOLR
INTRAVENOUS | Status: AC
  Filled 2021-03-19: qty 1000

## 2021-03-19 MED ORDER — METHOCARBAMOL 1000 MG/10ML IJ SOLN
500.0000 mg | Freq: Four times a day (QID) | INTRAVENOUS | Status: DC | PRN
  Filled 2021-03-19: qty 5

## 2021-03-19 MED ORDER — OXYCODONE HCL 5 MG PO TABS
ORAL_TABLET | ORAL | Status: AC
  Filled 2021-03-19: qty 2

## 2021-03-19 MED ORDER — ACETAMINOPHEN 325 MG PO TABS: 325.0000 mg | ORAL_TABLET | Freq: Once | ORAL | Status: DC | PRN

## 2021-03-19 MED ORDER — DOCUSATE SODIUM 100 MG PO CAPS
100.0000 mg | ORAL_CAPSULE | Freq: Two times a day (BID) | ORAL | Status: DC
  Administered 2021-03-19 – 2021-03-20 (×2): 100 mg via ORAL
  Filled 2021-03-19 (×2): qty 1

## 2021-03-19 MED ORDER — ONDANSETRON HCL 4 MG PO TABS: 4.0000 mg | ORAL_TABLET | Freq: Four times a day (QID) | ORAL | Status: DC | PRN

## 2021-03-19 MED ORDER — PHENYLEPHRINE HCL-NACL 10-0.9 MG/250ML-% IV SOLN
INTRAVENOUS | Status: DC | PRN
  Administered 2021-03-19: 10 ug/min via INTRAVENOUS

## 2021-03-19 MED ORDER — MEPERIDINE HCL 25 MG/ML IJ SOLN: 6.2500 mg | INTRAMUSCULAR | Status: DC | PRN

## 2021-03-19 MED ORDER — ORAL CARE MOUTH RINSE: 15.0000 mL | Freq: Once | OROMUCOSAL | Status: AC

## 2021-03-19 SURGICAL SUPPLY — 78 items
ALCOHOL 70% 16 OZ (MISCELLANEOUS) ×2 IMPLANT
BAG COUNTER SPONGE SURGICOUNT (BAG) ×4 IMPLANT
BAG DECANTER FOR FLEXI CONT (MISCELLANEOUS) ×2 IMPLANT
BANDAGE ESMARK 6X9 LF (GAUZE/BANDAGES/DRESSINGS) IMPLANT
BLADE SAG 18X100X1.27 (BLADE) ×2 IMPLANT
BNDG ESMARK 6X9 LF (GAUZE/BANDAGES/DRESSINGS)
BOWL SMART MIX CTS (DISPOSABLE) ×2 IMPLANT
CEMENT BONE R 1X40 (Cement) ×4 IMPLANT
CLSR STERI-STRIP ANTIMIC 1/2X4 (GAUZE/BANDAGES/DRESSINGS) ×2 IMPLANT
COMP FEM CMT PERSONA SZ7 RT (Joint) ×2 IMPLANT
COMPONENT FEM CMT PRSONA SZ7RT (Joint) ×1 IMPLANT
COOLER ICEMAN CLASSIC (MISCELLANEOUS) ×2 IMPLANT
COVER SURGICAL LIGHT HANDLE (MISCELLANEOUS) ×2 IMPLANT
CUFF TOURN SGL QUICK 34 (TOURNIQUET CUFF) ×2
CUFF TOURN SGL QUICK 42 (TOURNIQUET CUFF) IMPLANT
CUFF TRNQT CYL 34X4.125X (TOURNIQUET CUFF) ×1 IMPLANT
DERMABOND ADVANCED (GAUZE/BANDAGES/DRESSINGS) ×1
DERMABOND ADVANCED .7 DNX12 (GAUZE/BANDAGES/DRESSINGS) ×1 IMPLANT
DRAPE EXTREMITY T 121X128X90 (DISPOSABLE) ×2 IMPLANT
DRAPE HALF SHEET 40X57 (DRAPES) ×2 IMPLANT
DRAPE INCISE IOBAN 66X45 STRL (DRAPES) IMPLANT
DRAPE ORTHO SPLIT 77X108 STRL (DRAPES) ×4
DRAPE POUCH INSTRU U-SHP 10X18 (DRAPES) ×2 IMPLANT
DRAPE SURG ORHT 6 SPLT 77X108 (DRAPES) ×2 IMPLANT
DRAPE U-SHAPE 47X51 STRL (DRAPES) ×4 IMPLANT
DRSG AQUACEL AG ADV 3.5X10 (GAUZE/BANDAGES/DRESSINGS) IMPLANT
DRSG AQUACEL AG ADV 3.5X14 (GAUZE/BANDAGES/DRESSINGS) ×2 IMPLANT
DURAPREP 26ML APPLICATOR (WOUND CARE) ×6 IMPLANT
ELECT CAUTERY BLADE 6.4 (BLADE) ×2 IMPLANT
ELECT REM PT RETURN 9FT ADLT (ELECTROSURGICAL) ×2
ELECTRODE REM PT RTRN 9FT ADLT (ELECTROSURGICAL) ×1 IMPLANT
GLOVE SURG LTX SZ7 (GLOVE) ×6 IMPLANT
GLOVE SURG NEOP MICRO LF SZ7.5 (GLOVE) ×6 IMPLANT
GLOVE SURG SYN 7.5  E (GLOVE) ×8
GLOVE SURG SYN 7.5 E (GLOVE) ×4 IMPLANT
GLOVE SURG UNDER POLY LF SZ7 (GLOVE) ×10 IMPLANT
GOWN STRL REIN XL XLG (GOWN DISPOSABLE) ×4 IMPLANT
GOWN STRL REUS W/ TWL LRG LVL3 (GOWN DISPOSABLE) ×1 IMPLANT
GOWN STRL REUS W/TWL LRG LVL3 (GOWN DISPOSABLE) ×2
HANDPIECE INTERPULSE COAX TIP (DISPOSABLE) ×2
HDLS TROCR DRIL PIN KNEE 75 (PIN) ×8
HOOD PEEL AWAY FLYTE STAYCOOL (MISCELLANEOUS) ×4 IMPLANT
INSERT TIB ASF VIV SZ 6-7 12H (Insert) ×2 IMPLANT
JET LAVAGE IRRISEPT WOUND (IRRIGATION / IRRIGATOR) ×2
KIT BASIN OR (CUSTOM PROCEDURE TRAY) ×2 IMPLANT
KIT TURNOVER KIT B (KITS) ×2 IMPLANT
LAVAGE JET IRRISEPT WOUND (IRRIGATION / IRRIGATOR) ×1 IMPLANT
MANIFOLD NEPTUNE II (INSTRUMENTS) ×2 IMPLANT
MARKER SKIN DUAL TIP RULER LAB (MISCELLANEOUS) ×2 IMPLANT
NEEDLE SPNL 18GX3.5 QUINCKE PK (NEEDLE) ×2 IMPLANT
NS IRRIG 1000ML POUR BTL (IV SOLUTION) ×2 IMPLANT
PACK TOTAL JOINT (CUSTOM PROCEDURE TRAY) ×2 IMPLANT
PAD ARMBOARD 7.5X6 YLW CONV (MISCELLANEOUS) ×4 IMPLANT
PAD COLD SHLDR WRAP-ON (PAD) ×2 IMPLANT
PIN DRILL HDLS TROCAR 75 4PK (PIN) ×4 IMPLANT
SAW OSC TIP CART 19.5X105X1.3 (SAW) ×2 IMPLANT
SCREW FEMALE HEX FIX 25X2.5 (ORTHOPEDIC DISPOSABLE SUPPLIES) ×2 IMPLANT
SET HNDPC FAN SPRY TIP SCT (DISPOSABLE) ×1 IMPLANT
STAPLER VISISTAT 35W (STAPLE) IMPLANT
STEM POLY PAT PLY 32M KNEE (Knees) ×2 IMPLANT
STEM TIBIA 5 DEG SZ D R KNEE (Knees) ×1 IMPLANT
SUCTION FRAZIER HANDLE 10FR (MISCELLANEOUS) ×2
SUCTION TUBE FRAZIER 10FR DISP (MISCELLANEOUS) ×1 IMPLANT
SUT ETHILON 2 0 FS 18 (SUTURE) ×4 IMPLANT
SUT MNCRL AB 4-0 PS2 18 (SUTURE) IMPLANT
SUT VIC AB 0 CT1 27 (SUTURE) ×8
SUT VIC AB 0 CT1 27XBRD ANBCTR (SUTURE) ×4 IMPLANT
SUT VIC AB 1 CTX 27 (SUTURE) ×6 IMPLANT
SUT VIC AB 2-0 CT1 27 (SUTURE) ×8
SUT VIC AB 2-0 CT1 TAPERPNT 27 (SUTURE) ×4 IMPLANT
SYR 50ML LL SCALE MARK (SYRINGE) ×4 IMPLANT
TIBIA STEM 5 DEG SZ D R KNEE (Knees) ×2 IMPLANT
TOWEL GREEN STERILE (TOWEL DISPOSABLE) ×2 IMPLANT
TOWEL GREEN STERILE FF (TOWEL DISPOSABLE) ×2 IMPLANT
TRAY CATH 16FR W/PLASTIC CATH (SET/KITS/TRAYS/PACK) IMPLANT
UNDERPAD 30X36 HEAVY ABSORB (UNDERPADS AND DIAPERS) ×2 IMPLANT
WRAP KNEE MAXI GEL POST OP (GAUZE/BANDAGES/DRESSINGS) ×2 IMPLANT
YANKAUER SUCT BULB TIP NO VENT (SUCTIONS) ×4 IMPLANT

## 2021-03-19 NOTE — Anesthesia Postprocedure Evaluation (Signed)
Anesthesia Post Note  Patient: Jill Hall  Procedure(s) Performed: RIGHT TOTAL KNEE ARTHROPLASTY (Right: Knee)     Patient location during evaluation: PACU Anesthesia Type: MAC, Spinal and Regional Level of consciousness: awake and alert Pain management: pain level controlled Vital Signs Assessment: post-procedure vital signs reviewed and stable Respiratory status: spontaneous breathing, nonlabored ventilation, respiratory function stable and patient connected to nasal cannula oxygen Cardiovascular status: stable and blood pressure returned to baseline Postop Assessment: no apparent nausea or vomiting Anesthetic complications: no   No notable events documented.  Last Vitals:  Vitals:   03/19/21 1215 03/19/21 1241  BP: (!) 146/78 (!) 143/67  Pulse: 68 71  Resp: 13 18  Temp:  36.5 C  SpO2: 98% 97%    Last Pain:  Vitals:   03/19/21 1441  TempSrc:   PainSc: 8                  Waylon Hershey P Eithen Castiglia

## 2021-03-19 NOTE — H&P (Signed)
PREOPERATIVE H&P  Chief Complaint: right knee degenerative joint disease  HPI: Jill Hall is a 60 y.o. female who presents for surgical treatment of right knee degenerative joint disease.  She denies any changes in medical history.  Past Medical History:  Diagnosis Date   Arthritis    both knees   Hypertension    Multiple sclerosis ([place])    Neuromuscular disorder ([place])    Multiple Sclerosis- affects her vision only   Past Surgical History:  Procedure Laterality Date   CESAREAN SECTION     UTERINE FIBROID SURGERY     Social History   Socioeconomic History   Marital status: Married    Spouse name: Not on file   Number of children: Not on file   Years of education: Not on file   Highest education level: Not on file  Occupational History   Not on file  Tobacco Use   Smoking status: Never   Smokeless tobacco: Never  Vaping Use   Vaping Use: Never used  Substance and Sexual Activity   Alcohol use: No   Drug use: No   Sexual activity: Not on file  Other Topics Concern   Not on file  Social History Narrative   Not on file   Social Determinants of Health   Financial Resource Strain: Not on file  Food Insecurity: Not on file  Transportation Needs: Not on file  Physical Activity: Not on file  Stress: Not on file  Social Connections: Not on file   Family History  Problem Relation Age of Onset   Breast cancer Mother 12   Allergies  Allergen Reactions   Pineapple Swelling   Prior to Admission medications   Medication Sig Start Date End Date Taking? Authorizing Provider  atorvastatin (LIPITOR) 10 MG tablet TAKE 1 TABLET BY MOUTH ONCE DAILY Patient taking differently: Take 10 mg by mouth daily. 07/10/20 07/10/21 Yes Wenda Low, MD  Cholecalciferol 2000 UNITS TABS Take 2,000 Units by mouth daily. 08/05/11  Yes   Fingolimod HCl 0.5 MG CAPS TAKE 1 CAPSULE (0.5 MG TOTAL) BY MOUTH DAILY. 03/21/20 03/30/21 Yes Tresa Garter, MD  losartan (COZAAR) 50 MG  tablet Take 1 tablet (50 mg total) by mouth daily. 01/08/21  Yes   Multiple Vitamin (MULTIVITAMIN WITH MINERALS) TABS Take 1 tablet by mouth daily.   Yes [provider]  aspirin EC 81 MG tablet Take 1 tablet (81 mg total) by mouth 2 (two) times daily. 03/13/21   Aundra Dubin, PA-C  diclofenac sodium (VOLTAREN) 1 % GEL Apply 2 g topically 4 (four) times daily. Patient not taking: Reported on 03/09/2021 04/30/18   Leandrew Koyanagi, MD  docusate sodium (COLACE) 100 MG capsule Take 1 capsule (100 mg total) by mouth daily as needed. 03/13/21   Aundra Dubin, PA-C  Ergocalciferol 2000 units TABS Take 2,000 Units by mouth daily. Patient not taking: Reported on 03/09/2021 01/29/16   Britt Bottom, MD  losartan (COZAAR) 25 MG tablet TAKE 1 TABLET BY MOUTH ONCE DAILY Patient not taking: Reported on 03/09/2021 07/10/20 07/10/21  Wenda Low, MD  methocarbamol (ROBAXIN) 500 MG tablet Take 1 tablet (500 mg total) by mouth 2 (two) times daily as needed. 03/13/21   Aundra Dubin, PA-C  ondansetron (ZOFRAN) 4 MG tablet Take 1 tablet (4 mg total) by mouth every 8 (eight) hours as needed for nausea or vomiting. 03/13/21   Aundra Dubin, PA-C  oxyCODONE-acetaminophen (PERCOCET) 5-325 MG tablet Take 1-2  tablets by mouth every 6 (six) hours as needed. To be taken after surgery as needed 03/13/21   Aundra Dubin, PA-C     Positive ROS: All other systems have been reviewed and were otherwise negative with the exception of those mentioned in the HPI and as above.  Physical Exam: General: Alert, no acute distress Cardiovascular: No pedal edema Respiratory: No cyanosis, no use of accessory musculature GI: abdomen soft Skin: No lesions in the area of chief complaint Neurologic: Sensation intact distally Psychiatric: Patient is competent for consent with normal mood and affect Lymphatic: no lymphedema  MUSCULOSKELETAL: exam stable  Assessment: right knee degenerative joint disease  Plan: Plan  for Procedure(s): RIGHT TOTAL KNEE ARTHROPLASTY  The risks benefits and alternatives were discussed with the patient including but not limited to the risks of nonoperative treatment, versus surgical intervention including infection, bleeding, nerve injury,  blood clots, cardiopulmonary complications, morbidity, mortality, among others, and they were willing to proceed.   Preoperative templating of the joint replacement has been completed, documented, and submitted to the Operating Room personnel in order to optimize intra-operative equipment management.   Eduard Roux, MD 03/19/2021 6:03 AM

## 2021-03-19 NOTE — Anesthesia Procedure Notes (Signed)
Spinal  Start time: 03/19/2021 7:33 AM End time: 03/19/2021 7:35 AM Reason for block: surgical anesthesia Staffing Performed: anesthesiologist  Anesthesiologist: Effie Berkshire, MD Preanesthetic Checklist Completed: patient identified, IV checked, site marked, risks and benefits discussed, surgical consent, monitors and equipment checked, pre-op evaluation and timeout performed Spinal Block Patient position: sitting Prep: DuraPrep and site prepped and draped Location: L3-4 Injection technique: single-shot Needle Needle type: Pencan  Needle gauge: 24 G Needle length: 10 cm Needle insertion depth: 10 cm Additional Notes Patient tolerated well. No immediate complications.

## 2021-03-19 NOTE — Anesthesia Preprocedure Evaluation (Addendum)
Anesthesia Evaluation  Patient identified by MRN, date of birth, ID band Patient awake    Reviewed: Allergy & Precautions, NPO status , Patient's Chart, lab work & pertinent test results  Airway Mallampati: II  TM Distance: >3 FB Neck ROM: Full    Dental  (+) Teeth Intact, Dental Advisory Given   Pulmonary neg pulmonary ROS,    breath sounds clear to auscultation       Cardiovascular hypertension, Pt. on medications  Rhythm:Regular Rate:Normal     Neuro/Psych  Neuromuscular disease negative psych ROS   GI/Hepatic negative GI ROS, Neg liver ROS,   Endo/Other  negative endocrine ROS  Renal/GU negative Renal ROS     Musculoskeletal   Abdominal Normal abdominal exam  (+)   Peds  Hematology negative hematology ROS (+)   Anesthesia Other Findings   Reproductive/Obstetrics                            Anesthesia Physical Anesthesia Plan  ASA: 2  Anesthesia Plan: General   Post-op Pain Management:    Induction: Intravenous  PONV Risk Score and Plan: 4 or greater and Ondansetron, Dexamethasone, Midazolam and Propofol infusion  Airway Management Planned: Natural Airway and Simple Face Mask  Additional Equipment: None  Intra-op Plan:   Post-operative Plan:   Informed Consent: I have reviewed the patients History and Physical, chart, labs and discussed the procedure including the risks, benefits and alternatives for the proposed anesthesia with the patient or authorized representative who has indicated his/her understanding and acceptance.       Plan Discussed with: CRNA  Anesthesia Plan Comments: (Lab Results      Component                Value               Date                      WBC                      3.3 (L)             03/14/2021                HGB                      12.4                03/14/2021                HCT                      38.8                03/14/2021                 MCV                      85.8                03/14/2021                PLT                      264                 03/14/2021  Lab Results      Component                Value               Date                      INR                      1.0                 03/14/2021           )       Anesthesia Quick Evaluation

## 2021-03-19 NOTE — Op Note (Signed)
Total Knee Arthroplasty Procedure Note  Preoperative diagnosis: Right knee osteoarthritis, varus deformity  Postoperative diagnosis:same  Operative procedure: Right total knee arthroplasty. CPT [phone]  Surgeon: N. Eduard Roux, MD  Assist: Madalyn Rob, PA-C; necessary for the timely completion of procedure and due to complexity of procedure.  Anesthesia: Spinal, regional, local  Tourniquet time: see anesthesia record  Implants used: Zimmer persona Femur: CR 7 Tibia: D Patella: 32 mm Polyethylene: 12 mm, MC  Indication: Jill Hall is a 60 y.o. year old female with a history of knee pain. Having failed conservative management, the patient elected to proceed with a total knee arthroplasty.  We have reviewed the risk and benefits of the surgery and they elected to proceed after voicing understanding.  Procedure:  After informed consent was obtained and understanding of the risk were voiced including but not limited to bleeding, infection, damage to surrounding structures including nerves and vessels, blood clots, leg length inequality and the failure to achieve desired results, the operative extremity was marked with verbal confirmation of the patient in the holding area.   The patient was then brought to the operating room and transported to the operating room table in the supine position.  A tourniquet was applied to the operative extremity around the upper thigh. The operative limb was then prepped and draped in the usual sterile fashion and preoperative antibiotics were administered.  A time out was performed prior to the start of surgery confirming the correct extremity, preoperative antibiotic administration, as well as team members, implants and instruments available for the case. Correct surgical site was also confirmed with preoperative radiographs. The limb was then elevated for exsanguination and the tourniquet was inflated. A midline incision was made and a  standard medial parapatellar approach was performed.  The infrapatellar fat pad was removed.  Suprapatellar synovium was removed to reveal the anterior distal femoral cortex.  A medial peel was performed to release the capsule of the medial tibial plateau.  The patella was then everted and was prepared and sized to a 32 mm.  A cover was placed on the patella for protection from retractors.  The knee was then brought into full flexion and we then turned our attention to the femur.  The cruciates were sacrificed.  Start site was drilled in the femur and the intramedullary distal femoral cutting guide was placed, set at 3 degrees valgus, taking 10 mm of distal resection. The distal cut was made. Osteophytes were then removed.  Next, the proximal tibial cutting guide was placed with appropriate slope, varus/valgus alignment and depth of resection. The proximal tibial cut was made. Gap blocks were then used to assess the extension gap and alignment, and appropriate soft tissue releases were performed. Attention was turned back to the femur, which was sized using the sizing guide to a size 7. Appropriate rotation of the femoral component was determined using epicondylar axis, Whiteside's line, and assessing the flexion gap under ligament tension. The appropriate size 4-in-1 cutting block was placed and checked with an angel wing and cuts were made. Posterior femoral osteophytes and uncapped bone were then removed with the curved osteotome.  Trial components were placed, and stability was checked in full extension, mid-flexion, and deep flexion. Proper tibial rotation was determined and marked.  The patella tracked well without a lateral release.  The femoral lugs were then drilled. Trial components were then removed and tibial preparation performed.  The tibia was sized for a size D component.  The bony surfaces were irrigated with a pulse lavage and then dried. Bone cement was vacuum mixed on the back table, and the  final components sized above were cemented into place.  Antibiotic irrigation was placed in the knee joint and soft tissues while the cement cured.  After cement had finished curing, excess cement was removed. The stability of the construct was re-evaluated throughout a range of motion and found to be acceptable. The trial liner was removed, the knee was copiously irrigated, and the knee was re-evaluated for any excess bone debris. The real polyethylene liner, 12 mm thick, was inserted and checked to ensure the locking mechanism had engaged appropriately. The tourniquet was deflated and hemostasis was achieved. The wound was irrigated with normal saline.  One gram of vancomycin powder was placed in the surgical bed.  Capsular closure was performed with a #1 vicryl, subcutaneous fat closed with a 0 vicryl suture, then subcutaneous tissue closed with interrupted 2.0 vicryl suture. The skin was then closed with a 2.0 nylon and dermabond. A sterile dressing was applied.  The patient was awakened in the operating room and taken to recovery in stable condition. All sponge, needle, and instrument counts were correct at the end of the case.  Tawanna Cooler was necessary for opening, closing, retracting, limb positioning and overall facilitation and completion of the surgery.  Position: supine  Complications: none.  Time Out: performed   Drains/Packing: none  Estimated blood loss: minimal  Returned to Recovery Room: in good condition.   Antibiotics: yes   Mechanical VTE (DVT) Prophylaxis: sequential compression devices, TED thigh-high  Chemical VTE (DVT) Prophylaxis: aspirin  Fluid Replacement  Crystalloid: see anesthesia record Blood: none  FFP: none   Specimens Removed: 1 to pathology   Sponge and Instrument Count Correct? yes   PACU: portable radiograph - knee AP and Lateral   Plan/RTC: Return in 2 weeks for wound check.   Weight Bearing/Load Lower Extremity: full   Implant Name Type  Inv. Item Serial No. Manufacturer Lot No. LRB No. Used Action  CEMENT BONE R 1X40 - [id number] Cement CEMENT BONE R 1X40  ZIMMER RECON(ORTH,TRAU,BIO,SG) [id number] Right 2 Implanted  TIBIA STEM 5 DEG SZ D R KNEE - [id number] Knees TIBIA STEM 5 DEG SZ D R KNEE  ZIMMER RECON(ORTH,TRAU,BIO,SG) [id number] Right 1 Implanted  STEM POLY PAT PLY 62M KNEE - [id number] Knees STEM POLY PAT PLY 62M KNEE  ZIMMER RECON(ORTH,TRAU,BIO,SG) [id number] Right 1 Implanted  COMP FEM CMT PERSONA SZ7 RT - [id number] Joint COMP FEM CMT PERSONA SZ7 RT  ZIMMER RECON(ORTH,TRAU,BIO,SG) [id number] Right 1 Implanted  INSERT TIB ASF VIV SZ 6-7 12H - [id number] Insert INSERT TIB ASF VIV SZ 6-7 12H  ZIMMER RECON(ORTH,TRAU,BIO,SG) [id number] Right 1 Implanted    N. Eduard Roux, MD [facility] 9:14 AM

## 2021-03-19 NOTE — Discharge Instructions (Signed)

## 2021-03-19 NOTE — Anesthesia Procedure Notes (Signed)
Anesthesia Regional Block: Adductor canal block   Pre-Anesthetic Checklist: , timeout performed,  Correct Patient, Correct Site, Correct Laterality,  Correct Procedure, Correct Position, site marked,  Risks and benefits discussed,  Surgical consent,  Pre-op evaluation,  At surgeon's request and post-op pain management  Laterality: Right  Prep: chloraprep       Needles:  Injection technique: Single-shot  Needle Type: Echogenic Stimulator Needle     Needle Length: 9cm  Needle Gauge: 21     Additional Needles:   Procedures:,,,, ultrasound used (permanent image in chart),,    Narrative:  Start time: 03/19/2021 7:15 AM End time: 03/19/2021 7:20 AM Injection made incrementally with aspirations every 5 mL.  Performed by: Personally  Anesthesiologist: Effie Berkshire, MD  Additional Notes: Patient tolerated the procedure well. Local anesthetic introduced in an incremental fashion under minimal resistance after negative aspirations. No paresthesias were elicited. After completion of the procedure, no acute issues were identified and patient continued to be monitored by RN.

## 2021-03-19 NOTE — Evaluation (Signed)
Physical Therapy Evaluation Patient Details Name: Jill Hall MRN: [id number] DOB: 12-Feb-1961 Today's Date: 03/19/2021   History of Present Illness  Pt is a 60 y/o female s/p R TKA. PMH includes MS and HTN.  Clinical Impression  Pt is s/p surgery above with deficits below. Pt able to stand with min A, however, demonstrating very flexed posture and reporting increased dizziness and nausea. Further mobility deferred. Reviewed knee precautions with pt. Anticipate pt will progress well once symptoms improve. Will continue to follow acutely.     Follow Up Recommendations Follow surgeon's recommendation for DC plan and follow-up therapies    Equipment Recommendations  None recommended by PT    Recommendations for Other Services       Precautions / Restrictions Precautions Precautions: Knee Precaution Booklet Issued: No Precaution Comments: Verbally reviewed knee precautions. Restrictions Weight Bearing Restrictions: Yes RLE Weight Bearing: Weight bearing as tolerated      Mobility  Bed Mobility Overal bed mobility: Needs Assistance Bed Mobility: Supine to Sit;Sit to Supine     Supine to sit: Min assist Sit to supine: Min assist   General bed mobility comments: Min A for RLE assist. Increased time required.    Transfers Overall transfer level: Needs assistance Equipment used: Rolling walker (2 wheeled) Transfers: Sit to/from Stand Sit to Stand: Min assist;From elevated surface         General transfer comment: Attempted to stand X2. Required min A for lift assist.  On second attempt pt with very flexed posture and reporting increased dizziness and nausea so further mobility deferred.  Ambulation/Gait             General Gait Details: unable  Stairs            Wheelchair Mobility    Modified Rankin (Stroke Patients Only)       Balance Overall balance assessment: Needs assistance Sitting-balance support: No upper extremity supported;Feet  supported Sitting balance-Leahy Scale: Good     Standing balance support: Bilateral upper extremity supported;During functional activity Standing balance-Leahy Scale: Poor Standing balance comment: Reliant on BUE support                             Pertinent Vitals/Pain Pain Assessment: 0-10 Pain Score: 8  Pain Location: R knee Pain Descriptors / Indicators: Aching;Operative site guarding Pain Intervention(s): Limited activity within patient's tolerance;Monitored during session;Repositioned    Home Living Family/patient expects to be discharged to:: Private residence Living Arrangements: Spouse/significant other;Children Available Help at Discharge: Family;Available 24 hours/day Type of Home: House Home Access: Level entry     Home Layout: Other (Comment) (has 1 step in house to get from room to room) Home Equipment: Gilford Rile - 2 wheels;Other (comment) (CPM)      Prior Function Level of Independence: Independent               Hand Dominance        Extremity/Trunk Assessment   Upper Extremity Assessment Upper Extremity Assessment: Overall WFL for tasks assessed    Lower Extremity Assessment Lower Extremity Assessment: RLE deficits/detail RLE Deficits / Details: Deficits consistent with post op pain and weakness.    Cervical / Trunk Assessment Cervical / Trunk Assessment: Normal  Communication   Communication: No difficulties  Cognition Arousal/Alertness: Awake/alert Behavior During Therapy: WFL for tasks assessed/performed Overall Cognitive Status: Within Functional Limits for tasks assessed  General Comments      Exercises     Assessment/Plan    PT Assessment Patient needs continued PT services  PT Problem List Decreased strength;Decreased range of motion;Decreased activity tolerance;Decreased balance;Decreased mobility;Decreased knowledge of use of DME;Decreased knowledge of  precautions       PT Treatment Interventions DME instruction;Gait training;Functional mobility training;Stair training;Therapeutic activities;Therapeutic exercise;Balance training;Patient/family education    PT Goals (Current goals can be found in the Care Plan section)  Acute Rehab PT Goals Patient Stated Goal: to go home PT Goal Formulation: With patient Time For Goal Achievement: 04/02/21 Potential to Achieve Goals: Good    Frequency 7X/week   Barriers to discharge        Co-evaluation               AM-PAC PT "6 Clicks" Mobility  Outcome Measure Help needed turning from your back to your side while in a flat bed without using bedrails?: None Help needed moving from lying on your back to sitting on the side of a flat bed without using bedrails?: A Little Help needed moving to and from a bed to a chair (including a wheelchair)?: A Little Help needed standing up from a chair using your arms (e.g., wheelchair or bedside chair)?: A Little Help needed to walk in hospital room?: A Lot Help needed climbing 3-5 steps with a railing? : A Lot 6 Click Score: 17    End of Session Equipment Utilized During Treatment: Gait belt Activity Tolerance: Treatment limited secondary to medical complications (Comment) (dizziness, nausea) Patient left: in bed;with call bell/phone within reach Nurse Communication: Mobility status PT Visit Diagnosis: Other abnormalities of gait and mobility (R26.89);Pain;Difficulty in walking, not elsewhere classified (R26.2) Pain - Right/Left: Right Pain - part of body: Knee    Time: [id number] PT Time Calculation (min) (ACUTE ONLY): 20 min   Charges:   PT Evaluation $PT Eval Low Complexity: 1 Low          Lou Miner, DPT  Acute Rehabilitation Services  Pager: [phone] Office: [phone]   Rudean Hitt 03/19/2021, 4:20 PM

## 2021-03-19 NOTE — Transfer of Care (Signed)
Immediate Anesthesia Transfer of Care Note  Patient: Jill Hall  Procedure(s) Performed: RIGHT TOTAL KNEE ARTHROPLASTY (Right: Knee)  Patient Location: PACU  Anesthesia Type:Spinal and MAC combined with regional for post-op pain  Level of Consciousness: awake  Airway & Oxygen Therapy: Patient Spontanous Breathing  Post-op Assessment: Report given to RN and Post -op Vital signs reviewed and stable  Post vital signs: Reviewed and stable  Last Vitals:  Vitals Value Taken Time  BP 110/64 03/19/21 0956  Temp    Pulse 71 03/19/21 0957  Resp 9 03/19/21 0957  SpO2 98 % 03/19/21 0957  Vitals shown include unvalidated device data.  Last Pain:  Vitals:   03/19/21 0602  TempSrc:   PainSc: 7       Patients Stated Pain Goal: 2 (92/95/74 7340)  Complications: No notable events documented.

## 2021-03-20 ENCOUNTER — Encounter (HOSPITAL_COMMUNITY): Payer: Self-pay | Admitting: Orthopaedic Surgery

## 2021-03-20 DIAGNOSIS — Z79899 Other long term (current) drug therapy: Secondary | ICD-10-CM | POA: Diagnosis not present

## 2021-03-20 DIAGNOSIS — Z96651 Presence of right artificial knee joint: Secondary | ICD-10-CM | POA: Diagnosis not present

## 2021-03-20 DIAGNOSIS — M21161 Varus deformity, not elsewhere classified, right knee: Secondary | ICD-10-CM | POA: Diagnosis not present

## 2021-03-20 DIAGNOSIS — Z7982 Long term (current) use of aspirin: Secondary | ICD-10-CM | POA: Diagnosis not present

## 2021-03-20 DIAGNOSIS — M1711 Unilateral primary osteoarthritis, right knee: Secondary | ICD-10-CM | POA: Diagnosis not present

## 2021-03-20 DIAGNOSIS — I10 Essential (primary) hypertension: Secondary | ICD-10-CM | POA: Diagnosis not present

## 2021-03-20 LAB — CBC
HCT: 33.9 % — ABNORMAL LOW (ref 36.0–46.0)
Hemoglobin: 10.8 g/dL — ABNORMAL LOW (ref 12.0–15.0)
MCH: 27.4 pg (ref 26.0–34.0)
MCHC: 31.9 g/dL (ref 30.0–36.0)
MCV: 86 fL (ref 80.0–100.0)
Platelets: 271 10*3/uL (ref 150–400)
RBC: 3.94 MIL/uL (ref 3.87–5.11)
RDW: 15.7 % — ABNORMAL HIGH (ref 11.5–15.5)
WBC: 4.8 10*3/uL (ref 4.0–10.5)
nRBC: 0 % (ref 0.0–0.2)

## 2021-03-20 LAB — BASIC METABOLIC PANEL
Anion gap: 7 (ref 5–15)
BUN: 9 mg/dL (ref 8–23)
CO2: 26 mmol/L (ref 22–32)
Calcium: 8.9 mg/dL (ref 8.9–10.3)
Chloride: 106 mmol/L (ref 98–111)
Creatinine, Ser: 0.79 mg/dL (ref 0.44–1.00)
GFR, Estimated: >60 mL/min (ref >60)
Glucose, Bld: 112 mg/dL — ABNORMAL HIGH (ref 70–99)
Potassium: 3.6 mmol/L (ref 3.5–5.1)
Sodium: 139 mmol/L (ref 135–145)

## 2021-03-20 NOTE — Progress Notes (Signed)
Physical Therapy Treatment Patient Details Name: Jill Hall MRN: [id number] DOB: 1961/01/29 Today's Date: 03/20/2021    History of Present Illness Pt is a 60 y/o female s/p R TKA. PMH includes MS and HTN.    PT Comments    Continuing work on functional mobility and activity tolerance;  session focused on progressive amb, stair training, and therapeutic exercise; Pt return demonstrated education well; Questions solicited and answered; OK for dc home from PT standpoint    Follow Up Recommendations  Follow surgeon's recommendation for DC plan and follow-up therapies     Equipment Recommendations  None recommended by PT (3in1 delivered to room)    Recommendations for Other Services       Precautions / Restrictions Precautions Precautions: Knee Precaution Booklet Issued: Yes (comment) Precaution Comments: Verbally reviewed knee precautions. Restrictions RLE Weight Bearing: Weight bearing as tolerated    Mobility  Bed Mobility                    Transfers Overall transfer level: Needs assistance Equipment used: Rolling walker (2 wheeled) Transfers: Sit to/from Stand Sit to Stand: Min guard         General transfer comment: Cues for sequence and technique; also pre-positioning for less pain with stand<>sit  Ambulation/Gait Ambulation/Gait assistance: Min assist;Min guard Gait Distance (Feet): 75 Feet Assistive device: Rolling walker (2 wheeled) Gait Pattern/deviations: Step-to pattern;Decreased step length - left;Antalgic;Decreased stance time - right     General Gait Details: Cues for sequence, and to activate R quad in stance for more knee stability; no R knee buckle this afternoon   Stairs Stairs: Yes Stairs assistance: Min guard Stair Management: With walker;Backwards Number of Stairs: 1 (x2 reps) General stair comments: Good carryover of earlier education   Wheelchair Mobility    Modified Rankin (Stroke Patients Only)       Balance      Sitting balance-Leahy Scale: Good       Standing balance-Leahy Scale: Fair                              Cognition Arousal/Alertness: Awake/alert Behavior During Therapy: WFL for tasks assessed/performed Overall Cognitive Status: Within Functional Limits for tasks assessed                                        Exercises Total Joint Exercises Ankle Circles/Pumps: AROM;Both;20 reps Quad Sets: AROM;Right;10 reps (Noting some co-contraction with hamstrings, which is limiting full knee extension range -- improving this session) Short Arc Quad: AROM;Right;5 reps Heel Slides: AAROM;Right;5 reps Hip ABduction/ADduction: AROM;Right;5 reps Straight Leg Raises: AAROM;Right;5 reps Long Arc Quad: AAROM;Right;5 reps Goniometric ROM: approx 2-80 degrees    General Comments General comments (skin integrity, edema, etc.): Discussed car transfers, potential schedule for CPM use      Pertinent Vitals/Pain Pain Assessment: 0-10 Pain Score: 7  Pain Location: R knee Pain Descriptors / Indicators: Aching;Operative site guarding Pain Intervention(s): Monitored during session;Premedicated before session    Home Living                      Prior Function            PT Goals (current goals can now be found in the care plan section) Acute Rehab PT Goals Patient Stated Goal: to go home PT Goal Formulation:  With patient Time For Goal Achievement: 04/02/21 Potential to Achieve Goals: Good Progress towards PT goals: Progressing toward goals    Frequency    7X/week      PT Plan Current plan remains appropriate    Co-evaluation              AM-PAC PT "6 Clicks" Mobility   Outcome Measure  Help needed turning from your back to your side while in a flat bed without using bedrails?: None Help needed moving from lying on your back to sitting on the side of a flat bed without using bedrails?: A Little Help needed moving to and from a bed to a  chair (including a wheelchair)?: A Little Help needed standing up from a chair using your arms (e.g., wheelchair or bedside chair)?: A Little Help needed to walk in hospital room?: A Little Help needed climbing 3-5 steps with a railing? : A Little 6 Click Score: 19    End of Session Equipment Utilized During Treatment: Gait belt Activity Tolerance: Patient tolerated treatment well Patient left: in chair;with call bell/phone within reach Nurse Communication: Mobility status;Other (comment) (OK for dc) PT Visit Diagnosis: Other abnormalities of gait and mobility (R26.89);Pain;Difficulty in walking, not elsewhere classified (R26.2) Pain - Right/Left: Right Pain - part of body: Knee     Time: 1340-1418 PT Time Calculation (min) (ACUTE ONLY): 38 min  Charges:  $Gait Training: 8-22 mins $Therapeutic Exercise: 8-22 mins $Therapeutic Activity: 8-22 mins                     Roney Marion, PT  Acute Rehabilitation Services Pager [phone] Office [place] 03/20/2021, 4:38 PM

## 2021-03-20 NOTE — Progress Notes (Signed)
Physical Therapy Treatment Patient Details Name: Jill Hall MRN: [id number] DOB: 1961-07-23 Today's Date: 03/20/2021    History of Present Illness Pt is a 60 y/o female s/p R TKA. PMH includes MS and HTN.    PT Comments    Continuing work on functional mobility and activity tolerance;  Session focused on progressive amb; Able to walk in the hallway with minguard assist, quite slowly and with a considerable amount of pain; noting a few instances of mild R knee buckle, pt was able to support self on RW, so no loss of balance; Plan to return for second session to reinforce knee prec, therex, stair negotiation; I anticipate being able to dc home after that  Follow Up Recommendations  Follow surgeon's recommendation for DC plan and follow-up therapies     Equipment Recommendations  None recommended by PT    Recommendations for Other Services       Precautions / Restrictions Precautions Precautions: Knee Precaution Booklet Issued: Yes (comment) Precaution Comments: Verbally reviewed knee precautions. Restrictions Weight Bearing Restrictions: Yes RLE Weight Bearing: Weight bearing as tolerated    Mobility  Bed Mobility Overal bed mobility: Needs Assistance Bed Mobility: Supine to Sit;Sit to Supine     Supine to sit: Min assist Sit to supine: Min assist   General bed mobility comments: Min A for RLE assist. Increased time required.    Transfers Overall transfer level: Needs assistance Equipment used: Rolling walker (2 wheeled) Transfers: Sit to/from Stand Sit to Stand: Min guard         General transfer comment: Cues for sequence and technique; also pre-positioning for less pain with stand<>sit  Ambulation/Gait Ambulation/Gait assistance: Min assist;Min guard Gait Distance (Feet): 50 Feet Assistive device: Rolling walker (2 wheeled) Gait Pattern/deviations: Step-to pattern;Decreased step length - left;Antalgic;Decreased stance time - right Gait velocity: quite  slow   General Gait Details: Cues for sequence, and to activate R quad in stance for more knee stability; a few small moments of R knee mild buckling - pt able to support self on RW   Stairs         General stair comments: Discussed technique for negotiating one step, forward and backwards techniques   Wheelchair Mobility    Modified Rankin (Stroke Patients Only)       Balance Overall balance assessment: Needs assistance Sitting-balance support: No upper extremity supported;Feet supported Sitting balance-Leahy Scale: Good Sitting balance - Comments: reaching to feet to don underwear and socks   Standing balance support: Bilateral upper extremity supported;During functional activity Standing balance-Leahy Scale: Fair Standing balance comment: Reliant on BUE support                            Cognition Arousal/Alertness: Awake/alert Behavior During Therapy: WFL for tasks assessed/performed Overall Cognitive Status: Within Functional Limits for tasks assessed                                 General Comments: Pt pleasant and conversational throughout session. Demonstrating use of compensatory techniques following initial education      Exercises Total Joint Exercises Ankle Circles/Pumps: AROM;Both;20 reps Quad Sets: AROM;Right;10 reps (Noting some co-contraction with hamstrings, which is limiting full knee extension range)    General Comments General comments (skin integrity, edema, etc.): Pt reporting husband and daughter available to assist at discharge      Pertinent Vitals/Pain Pain Assessment: 0-10  Pain Score: 7  Faces Pain Scale: Hurts even more Pain Location: R knee Pain Descriptors / Indicators: Aching;Operative site guarding Pain Intervention(s): Monitored during session    Home Living Family/patient expects to be discharged to:: Private residence Living Arrangements: Spouse/significant other;Children Available Help at  Discharge: Family;Available 24 hours/day Type of Home: House Home Access: Level entry   Home Layout: Other (Comment) (one step in house from one room to another) Home Equipment: Gilford Rile - 2 wheels;Shower seat;Other (comment) (CPM)      Prior Function Level of Independence: Independent          PT Goals (current goals can now be found in the care plan section) Acute Rehab PT Goals Patient Stated Goal: to go home PT Goal Formulation: With patient Time For Goal Achievement: 04/02/21 Potential to Achieve Goals: Good Progress towards PT goals: Progressing toward goals    Frequency    7X/week      PT Plan Current plan remains appropriate    Co-evaluation              AM-PAC PT "6 Clicks" Mobility   Outcome Measure  Help needed turning from your back to your side while in a flat bed without using bedrails?: None Help needed moving from lying on your back to sitting on the side of a flat bed without using bedrails?: A Little Help needed moving to and from a bed to a chair (including a wheelchair)?: A Little Help needed standing up from a chair using your arms (e.g., wheelchair or bedside chair)?: A Little Help needed to walk in hospital room?: A Little Help needed climbing 3-5 steps with a railing? : A Little 6 Click Score: 19    End of Session Equipment Utilized During Treatment: Gait belt Activity Tolerance: Patient tolerated treatment well Patient left: with call bell/phone within reach;Other (comment) (in bathroom) Nurse Communication: Mobility status;Other (comment) (and pt is in bathroom) PT Visit Diagnosis: Other abnormalities of gait and mobility (R26.89);Pain;Difficulty in walking, not elsewhere classified (R26.2) Pain - Right/Left: Right Pain - part of body: Knee     Time: [id number] PT Time Calculation (min) (ACUTE ONLY): 33 min  Charges:  $Gait Training: 8-22 mins $Therapeutic Activity: 8-22 mins                     Roney Marion, PT  Acute  Rehabilitation Services Pager [phone] Office [place] 03/20/2021, 11:25 AM

## 2021-03-20 NOTE — TOC Progression Note (Signed)
Transition of Care [facility]) - Progression Note    Patient Details  Name: Jill Hall MRN: [id number] Date of Birth: Mar 08, 1961  Transition of Care [facility]) CM/SW [place], RN Phone [phone]  03/20/2021, 10:02 AM  Clinical Narrative:    [facility] consulted for [facility] needs. Patient has already beed set up for [facility] with [place]. No other needs noted at this time TOC will sign off.         Expected Discharge Plan and Services           Expected Discharge Date: 03/20/21                                     Social Determinants of Health (SDOH) Interventions    Readmission Risk Interventions No flowsheet data found.

## 2021-03-20 NOTE — Discharge Summary (Signed)
Patient ID: Jill Hall MRN: [id number] DOB/AGE: Sep 11, 1960 60 y.o.  Admit date: 03/19/2021 Discharge date: 03/20/2021  Admission Diagnoses:  Principal Problem:   Primary osteoarthritis of right knee Active Problems:   Status post total right knee replacement   Discharge Diagnoses:  Same  Past Medical History:  Diagnosis Date   Arthritis    both knees   Hypertension    Multiple sclerosis ([place])    Neuromuscular disorder ([place])    Multiple Sclerosis- affects her vision only    Surgeries: Procedure(s): RIGHT TOTAL KNEE ARTHROPLASTY on 03/19/2021   Consultants:   Discharged Condition: Improved  Hospital Course: Jill Hall is an 60 y.o. female who was admitted 03/19/2021 for operative treatment ofPrimary osteoarthritis of right knee. Patient has severe unremitting pain that affects sleep, daily activities, and work/hobbies. After pre-op clearance the patient was taken to the operating room on 03/19/2021 and underwent  Procedure(s): RIGHT TOTAL KNEE ARTHROPLASTY.    Patient was given perioperative antibiotics:  Anti-infectives (From admission, onward)    Start     Dose/Rate Route Frequency Ordered Stop   03/19/21 1330  ceFAZolin (ANCEF) IVPB 2g/100 mL premix        2 g 200 mL/hr over 30 Minutes Intravenous Every 6 hours 03/19/21 1244 03/19/21 2121   03/19/21 0600  ceFAZolin (ANCEF) IVPB 2g/100 mL premix        2 g 200 mL/hr over 30 Minutes Intravenous On call to O.R. 03/19/21 0545 03/19/21 0740        Patient was given sequential compression devices, early ambulation, and chemoprophylaxis to prevent DVT.  Patient benefited maximally from hospital stay and there were no complications.    Recent vital signs: Patient Vitals for the past 24 hrs:  BP Temp Temp src Pulse Resp SpO2  03/20/21 0729 (!) 141/69 98 F (36.7 C) Oral 84 18 98 %  03/20/21 0410 135/78 97.8 F (36.6 C) Oral 88 18 98 %  03/19/21 2318 126/67 98.2 F (36.8 C) Oral 80 20 98 %  03/19/21 1957 (!)  112/59 97.9 F (36.6 C) Oral 74 18 96 %  03/19/21 1718 121/67 97.7 F (36.5 C) Oral 70 18 96 %  03/19/21 1241 (!) 143/67 97.7 F (36.5 C) Oral 71 18 97 %  03/19/21 1215 (!) 146/78 -- -- 68 13 98 %  03/19/21 1200 131/77 -- -- 72 14 98 %  03/19/21 1145 (!) 143/82 -- -- 67 13 98 %  03/19/21 1115 (!) 144/88 -- -- 71 14 97 %  03/19/21 1100 (!) 143/87 -- -- 68 12 99 %  03/19/21 1045 134/79 (!) 97 F (36.1 C) -- 67 17 100 %  03/19/21 1025 127/77 -- -- 72 12 100 %  03/19/21 1010 125/70 -- -- 66 11 96 %  03/19/21 0955 110/64 97.8 F (36.6 C) -- 68 11 99 %     Recent laboratory studies:  Recent Labs    03/20/21 0443  WBC 4.8  HGB 10.8*  HCT 33.9*  PLT 271  NA 139  K 3.6  CL 106  CO2 26  BUN 9  CREATININE 0.79  GLUCOSE 112*  CALCIUM 8.9     Discharge Medications:   Allergies as of 03/20/2021       Reactions   Pineapple Swelling        Medication List     TAKE these medications    Aspirin Low Dose 81 MG EC tablet Generic drug: aspirin Take 1 tablet (81 mg total) by  mouth 2 (two) times daily.   Cholecalciferol 50 MCG (2000 UT) Tabs Take 2,000 Units by mouth daily.   docusate sodium 100 MG capsule Commonly known as: Colace Take 1 capsule (100 mg total) by mouth daily as needed.   Gilenya 0.5 MG Caps Generic drug: Fingolimod HCl TAKE 1 CAPSULE (0.5 MG TOTAL) BY MOUTH DAILY.   losartan 50 MG tablet Commonly known as: COZAAR Take 1 tablet (50 mg total) by mouth daily.   methocarbamol 500 MG tablet Commonly known as: Robaxin Take 1 tablet (500 mg total) by mouth 2 (two) times daily as needed.   multivitamin with minerals Tabs tablet Take 1 tablet by mouth daily.   ondansetron 4 MG tablet Commonly known as: Zofran Take 1 tablet (4 mg total) by mouth every 8 (eight) hours as needed for nausea or vomiting.   oxyCODONE-acetaminophen 5-325 MG tablet Commonly known as: Percocet Take 1-2 tablets by mouth every 6 (six) hours as needed. To be taken after  surgery as needed       ASK your doctor about these medications    atorvastatin 10 MG tablet Commonly known as: LIPITOR TAKE 1 TABLET BY MOUTH ONCE DAILY   diclofenac sodium 1 % Gel Commonly known as: VOLTAREN Apply 2 g topically 4 (four) times daily.   Ergocalciferol 50 MCG (2000 UT) Tabs Take 2,000 Units by mouth daily.   losartan 25 MG tablet Commonly known as: COZAAR TAKE 1 TABLET BY MOUTH ONCE DAILY               Durable Medical Equipment  (From admission, onward)           Start     Ordered   03/19/21 1245  DME Walker rolling  Once       Question Answer Comment  Walker: With 5 Inch Wheels   Patient needs a walker to treat with the following condition Status post left partial knee replacement      03/19/21 1244   03/19/21 1245  DME 3 n 1  Once        03/19/21 1244   03/19/21 1245  DME Bedside commode  Once       Question:  Patient needs a bedside commode to treat with the following condition  Answer:  Status post left partial knee replacement   03/19/21 1244            Diagnostic Studies: DG Chest 2 View  Result Date: 03/14/2021 CLINICAL DATA:  Preop for knee surgery. EXAM: CHEST - 2 VIEW COMPARISON:  04/15/2013. FINDINGS: Mediastinum and hilar structures normal. Heart size normal. Small questionable density noted over the right apex seen on PA view only. This may be outside of the patient. Repeat PA and lateral chest x-ray suggested for further evaluation. If this density persists nonenhanced chest CT can be obtained. No other abnormality identified. No pleural effusion or pneumothorax. Degenerative change thoracic spine. IMPRESSION: Small questionable density noted over the right apex seen on PA view only. This may be outside of the patient. Repeat PA and lateral chest x-ray suggested for further evaluation. If this density persists nonenhanced chest CT can be obtained for further evaluation. No other abnormality identified. These results will be  called to the ordering clinician or representative by the Radiologist Assistant, and communication documented in the PACS or [employer]. Electronically Signed   By: Marcello Moores  Register   On: 03/14/2021 09:37   DG Knee Right Port  Result Date: 03/19/2021 CLINICAL DATA:  Status  post right total knee arthroplasty. EXAM: PORTABLE RIGHT KNEE - 1-2 VIEW COMPARISON:  March 08, 2021. FINDINGS: The right femoral and tibial components are well situated. No fracture or dislocation is noted. IMPRESSION: Status post right total knee arthroplasty. Electronically Signed   By: Marijo Conception M.D.   On: 03/19/2021 11:52   XR KNEE 3 VIEW RIGHT  Result Date: 03/08/2021 Advanced tricompartment degenerative joint disease with varus deformity.   Disposition: Discharge disposition: 01-Home or Self Care          Follow-up Information     Leandrew Koyanagi, MD. Schedule an appointment as soon as possible for a visit in 2 week(s).   Specialty: Orthopedic Surgery Contact information: [address] [place] [address] [phone]                  Signed: Aundra Dubin 03/20/2021, 7:55 AM

## 2021-03-20 NOTE — Evaluation (Signed)
Occupational Therapy Evaluation/Discharge   Patient Details Name: Jill Hall MRN: [id number] DOB: 1960/11/11 Today's Date: 03/20/2021    History of Present Illness Pt is a 60 y/o female s/p R TKA. PMH includes MS and HTN.   Clinical Impression   PTA, pt lived with her husband and was independent. Pt requiring min A for LB ADLs. Performing sit<>stand transfers with min A for initial power up. Pt performing functional mobility with min Guard A. Pt educated and demonstrating compensatory techniques for bed mobility, LB dressing, and shower transfers. Pt with cautioned movement during session due to pain. All questions answered. Recommend discharge home with no follow up OT. Re-consult if change in status. OT to sign off.     Follow Up Recommendations  No OT follow up    Equipment Recommendations  3 in 1 bedside commode    Recommendations for Other Services       Precautions / Restrictions Precautions Precautions: Knee Precaution Booklet Issued: No Precaution Comments: Verbally reviewed knee precautions. Restrictions Weight Bearing Restrictions: Yes RLE Weight Bearing: Weight bearing as tolerated      Mobility Bed Mobility Overal bed mobility: Needs Assistance Bed Mobility: Supine to Sit;Sit to Supine     Supine to sit: Min assist Sit to supine: Min assist   General bed mobility comments: Min A for RLE assist. Increased time required.    Transfers Overall transfer level: Needs assistance Equipment used: Rolling walker (2 wheeled) Transfers: Sit to/from Stand Sit to Stand: Min assist         General transfer comment: Min A for sit<>stand transfers to power up    Balance Overall balance assessment: Needs assistance Sitting-balance support: No upper extremity supported;Feet supported Sitting balance-Leahy Scale: Good Sitting balance - Comments: reaching to feet to don underwear and socks   Standing balance support: Bilateral upper extremity supported;During  functional activity Standing balance-Leahy Scale: Fair Standing balance comment: Reliant on BUE support                           ADL either performed or assessed with clinical judgement   ADL Overall ADL's : Needs assistance/impaired Eating/Feeding: Set up;Sitting   Grooming: Min guard;Standing   Upper Body Bathing: Set up;Sitting   Lower Body Bathing: Sit to/from stand;Minimal assistance   Upper Body Dressing : Set up;Sitting   Lower Body Dressing: Minimal assistance;Sit to/from stand   Toilet Transfer: Minimal assistance;Comfort height toilet;Ambulation;RW   Toileting- Clothing Manipulation and Hygiene: Sit to/from stand;Minimal assistance   Tub/ Shower Transfer: Min guard;Ambulation;Shower Orthoptist Details (indicate cue type and reason): Pt educated and demonstrating compensatory techniques for shower transfer. Pt reporting the lip on her walk in shower is very small and that she feels cormfotable performing shower transfers at home with husband nearby Functional mobility during ADLs: Min guard;Rolling walker General ADL Comments: Pt requiring min A for LB ADLs. Requiring min A for sit<>stand transfers initial power up. Pt performing functional mobility after power up with min Guard A. Pt educated and demonstrating compensatory techniques for bed mobility, LB dressing, and shower transfers.     Vision Baseline Vision/History: Wears glasses Patient Visual Report: No change from baseline Vision Assessment?: No apparent visual deficits     Perception     Praxis      Pertinent Vitals/Pain Pain Assessment: Faces Faces Pain Scale: Hurts even more Pain Location: R knee Pain Descriptors / Indicators: Aching;Operative site guarding Pain Intervention(s): Limited activity within  patient's tolerance;Monitored during session;Repositioned     Hand Dominance     Extremity/Trunk Assessment Upper Extremity Assessment Upper Extremity  Assessment: Generalized weakness   Lower Extremity Assessment Lower Extremity Assessment: Defer to PT evaluation   Cervical / Trunk Assessment Cervical / Trunk Assessment: Normal   Communication Communication Communication: No difficulties   Cognition Arousal/Alertness: Awake/alert Behavior During Therapy: WFL for tasks assessed/performed Overall Cognitive Status: Within Functional Limits for tasks assessed                                 General Comments: Pt pleasant and conversational throughout session. Demonstrating use of compensatory techniques following initial education   General Comments  Pt reporting husband and daughter available to assist at discharge    Exercises     Shoulder Instructions      Home Living Family/patient expects to be discharged to:: Private residence Living Arrangements: Spouse/significant other;Children Available Help at Discharge: Family;Available 24 hours/day Type of Home: House Home Access: Level entry     Home Layout: Other (Comment) (one step in house from one room to another)     Bathroom Shower/Tub: Occupational psychologist: Standard     Home Equipment: Environmental consultant - 2 wheels;Shower seat;Other (comment) (CPM)          Prior Functioning/Environment Level of Independence: Independent                 OT Problem List: Decreased strength;Decreased activity tolerance;Impaired balance (sitting and/or standing);Decreased range of motion;Decreased knowledge of precautions;Pain      OT Treatment/Interventions:      OT Goals(Current goals can be found in the care plan section) Acute Rehab OT Goals Patient Stated Goal: to go home OT Goal Formulation: With patient  OT Frequency:     Barriers to D/C:            Co-evaluation              AM-PAC OT "6 Clicks" Daily Activity     Outcome Measure Help from another person eating meals?: A Little Help from another person taking care of personal  grooming?: A Little Help from another person toileting, which includes using toliet, bedpan, or urinal?: A Little Help from another person bathing (including washing, rinsing, drying)?: A Little Help from another person to put on and taking off regular upper body clothing?: A Little Help from another person to put on and taking off regular lower body clothing?: A Little 6 Click Score: 18   End of Session Equipment Utilized During Treatment: Gait belt;Rolling walker Nurse Communication: Mobility status  Activity Tolerance: Patient tolerated treatment well Patient left: in bed;with call bell/phone within reach  OT Visit Diagnosis: Unsteadiness on feet (R26.81);Muscle weakness (generalized) (M62.81);Other abnormalities of gait and mobility (R26.89);Pain Pain - Right/Left: Right Pain - part of body: Knee                Time: [id number] OT Time Calculation (min): 22 min Charges:  OT General Charges $OT Visit: 1 Visit OT Evaluation $OT Eval Low Complexity: 1 Low  Shanda Howells, OTDS   Shanda Howells 03/20/2021, 9:12 AM

## 2021-03-20 NOTE — Progress Notes (Signed)
Patient was transported via wheelchair by volunteer for discharge home; in no acute distress nor complaints of pain nor discomfort; room was checked and accounted for all her belongings; discharge instructions given to patient by assigned RN and patient verbalized understanding on the instructions given.

## 2021-03-20 NOTE — Progress Notes (Signed)
Subjective: 1 Day Post-Op Procedure(s) (LRB): RIGHT TOTAL KNEE ARTHROPLASTY (Right) Patient reports pain as mild.  Doing well this am without complaints. Walked to nurses station twice overnight.   Objective: Vital signs in last 24 hours: Temp:  [97 F (36.1 C)-98.2 F (36.8 C)] 98 F (36.7 C) (07/26 0729) Pulse Rate:  [66-88] 84 (07/26 0729) Resp:  [11-20] 18 (07/26 0729) BP: (110-146)/(59-88) 141/69 (07/26 0729) SpO2:  [96 %-100 %] 98 % (07/26 0729)  Intake/Output from previous day: 07/25 0701 - 07/26 0700 In: 1000 [I.V.:800; IV Piggyback:200] Out: 1950 [Urine:1800; Blood:150] Intake/Output this shift: No intake/output data recorded.  Recent Labs    03/20/21 0443  HGB 10.8*   Recent Labs    03/20/21 0443  WBC 4.8  RBC 3.94  HCT 33.9*  PLT 271   Recent Labs    03/20/21 0443  NA 139  K 3.6  CL 106  CO2 26  BUN 9  CREATININE 0.79  GLUCOSE 112*  CALCIUM 8.9   No results for input(s): LABPT, INR in the last 72 hours.  Neurologically intact Neurovascular intact Sensation intact distally Intact pulses distally Dorsiflexion/Plantar flexion intact Incision: scant drainage No cellulitis present Compartment soft   Assessment/Plan: 1 Day Post-Op Procedure(s) (LRB): RIGHT TOTAL KNEE ARTHROPLASTY (Right) Advance diet Up with therapy D/C IV fluids Discharge home with home health after second PT session WBAT RLE ABLA- mild and stable   Anticipated LOS equal to or greater than 2 midnights due to - Age 62 and older with one or more of the following:  - Obesity  - Expected need for hospital services (PT, OT, Nursing) required for safe  discharge  - Anticipated need for postoperative skilled nursing care or inpatient rehab  - Active co-morbidities: None OR   - Unanticipated findings during/Post Surgery: None  - Patient is a high risk of re-admission due to: None   Aundra Dubin 03/20/2021, 7:53 AM

## 2021-03-21 ENCOUNTER — Ambulatory Visit: Payer: 59 | Attending: Family Medicine | Admitting: Pharmacist

## 2021-03-21 ENCOUNTER — Other Ambulatory Visit: Payer: Self-pay

## 2021-03-21 DIAGNOSIS — Z471 Aftercare following joint replacement surgery: Secondary | ICD-10-CM | POA: Diagnosis not present

## 2021-03-21 DIAGNOSIS — G35 Multiple sclerosis: Secondary | ICD-10-CM | POA: Diagnosis not present

## 2021-03-21 DIAGNOSIS — Z96651 Presence of right artificial knee joint: Secondary | ICD-10-CM | POA: Diagnosis not present

## 2021-03-21 DIAGNOSIS — I10 Essential (primary) hypertension: Secondary | ICD-10-CM | POA: Diagnosis not present

## 2021-03-21 DIAGNOSIS — M1712 Unilateral primary osteoarthritis, left knee: Secondary | ICD-10-CM | POA: Diagnosis not present

## 2021-03-21 DIAGNOSIS — Z7982 Long term (current) use of aspirin: Secondary | ICD-10-CM | POA: Diagnosis not present

## 2021-03-21 DIAGNOSIS — Z79899 Other long term (current) drug therapy: Secondary | ICD-10-CM

## 2021-03-21 NOTE — Progress Notes (Signed)
   S: Patient presents today for review of their specialty medication.   Patient is currently taking Gilenya (fingolimod) for multiple sclerosis. Patient is managed by Dr. Felecia Shelling for this.   Adherence: confirms   Efficacy: reports that this mediation works well.  Dosing: Multiple sclerosis, relapsing: Oral: 0.5 mg once daily  Dosing: Renal Impairment: Adult  There are no dosage adjustments provided in the manufacturer's labeling; use with caution in severe renal impairment (exposure is increased). A small pharmacokinetic study in patients with stable severe impairment (CrCl <30 mL/minute) and not on dialysis suggests that increases in exposure to fingolimod and the active metabolite (fingolimod-P) are not clinically meaningful and dosage adjustment may not be necessary Shanon Brow 2015). Dosing: Hepatic Impairment: Adult  Hepatic impairment prior to treatment initiation: Mild to moderate impairment (Child-Pugh classes A and B): No dosage adjustment necessary. Severe impairment (Child-Pugh class C): There are no dosage adjustments provided in the manufacturer's labeling; use with caution and closely monitor; exposure is doubled in severe hepatic impairment. Hepatotoxicity during treatment:  ALT >3 times ULN and total bilirubin >2 times ULN: Interrupt treatment; permanently discontinue fingolimod if alternative etiology for hepatic injury cannot be identified due to risk of severe drug-induced liver injury.  Current adverse effects: none   Monitoring: S/sx of hepatotoxicity: denies   S/sx of infection: denies  S/sx of cardiovascular effects: denies  S/sx of respiratory effects: denies  S/sx of neurotoxicity: denies  S/sx of hypersensitivity: denies   O:   Lab Results  Component Value Date   WBC 4.8 03/20/2021   HGB 10.8 (L) 03/20/2021   HCT 33.9 (L) 03/20/2021   MCV 86.0 03/20/2021   PLT 271 03/20/2021      Chemistry      Component Value Date/Time   NA 139 03/20/2021 0443   NA  141 03/01/2020 1050   K 3.6 03/20/2021 0443   CL 106 03/20/2021 0443   CO2 26 03/20/2021 0443   BUN 9 03/20/2021 0443   BUN 13 03/01/2020 1050   CREATININE 0.79 03/20/2021 0443      Component Value Date/Time   CALCIUM 8.9 03/20/2021 0443   ALKPHOS 85 03/14/2021 0824   AST 18 03/14/2021 0824   ALT 17 03/14/2021 0824   BILITOT 0.7 03/14/2021 0824   BILITOT 0.4 03/01/2020 1050       A/P: 1. Medication review: Patient currently prescribed Gilenya for multiple sclerosis and is tolerating it well. Reviewed the medication with the patient, including the following: Gilenya is a medication used in the treatment of multiple sclerosis. Administer with or without food. Possible adverse effects are GI upset, increased LFTs, increased risk of infection, headache, cardiovascular effects, hypersensitivity, lymphopenia, increased risk of malignancy, neurotoxicity, Progressive multifocal leukoencephalopathy, and respiratory effects. No recommendations for any changes.  Benard Halsted, PharmD, [facility], [place] [phone]

## 2021-03-23 DIAGNOSIS — I10 Essential (primary) hypertension: Secondary | ICD-10-CM | POA: Diagnosis not present

## 2021-03-23 DIAGNOSIS — M1712 Unilateral primary osteoarthritis, left knee: Secondary | ICD-10-CM | POA: Diagnosis not present

## 2021-03-23 DIAGNOSIS — Z7982 Long term (current) use of aspirin: Secondary | ICD-10-CM | POA: Diagnosis not present

## 2021-03-23 DIAGNOSIS — G35 Multiple sclerosis: Secondary | ICD-10-CM | POA: Diagnosis not present

## 2021-03-23 DIAGNOSIS — Z471 Aftercare following joint replacement surgery: Secondary | ICD-10-CM | POA: Diagnosis not present

## 2021-03-23 DIAGNOSIS — Z96651 Presence of right artificial knee joint: Secondary | ICD-10-CM | POA: Diagnosis not present

## 2021-03-26 ENCOUNTER — Other Ambulatory Visit: Payer: Self-pay | Admitting: Neurology

## 2021-03-26 ENCOUNTER — Other Ambulatory Visit (HOSPITAL_COMMUNITY): Payer: Self-pay

## 2021-03-26 ENCOUNTER — Other Ambulatory Visit: Payer: Self-pay | Admitting: Pharmacist

## 2021-03-26 DIAGNOSIS — Z96651 Presence of right artificial knee joint: Secondary | ICD-10-CM | POA: Diagnosis not present

## 2021-03-26 DIAGNOSIS — M1712 Unilateral primary osteoarthritis, left knee: Secondary | ICD-10-CM | POA: Diagnosis not present

## 2021-03-26 DIAGNOSIS — G35 Multiple sclerosis: Secondary | ICD-10-CM

## 2021-03-26 DIAGNOSIS — Z471 Aftercare following joint replacement surgery: Secondary | ICD-10-CM | POA: Diagnosis not present

## 2021-03-26 DIAGNOSIS — I10 Essential (primary) hypertension: Secondary | ICD-10-CM | POA: Diagnosis not present

## 2021-03-26 DIAGNOSIS — Z7982 Long term (current) use of aspirin: Secondary | ICD-10-CM | POA: Diagnosis not present

## 2021-03-26 MED ORDER — GILENYA 0.5 MG PO CAPS
ORAL_CAPSULE | ORAL | 11 refills | Status: DC
  Filled 2021-03-26: qty 30, 30d supply, fill #0
  Filled 2021-04-25: qty 30, 30d supply, fill #1
  Filled 2021-05-21: qty 30, 30d supply, fill #2
  Filled 2021-06-19 – 2021-06-26 (×2): qty 30, 30d supply, fill #3
  Filled 2021-07-30: qty 30, 30d supply, fill #4
  Filled 2021-09-04: qty 30, 30d supply, fill #5

## 2021-03-26 MED ORDER — GILENYA 0.5 MG PO CAPS
ORAL_CAPSULE | ORAL | 11 refills | Status: DC
  Filled 2021-03-26: qty 30, fill #0

## 2021-03-27 ENCOUNTER — Other Ambulatory Visit: Payer: Self-pay

## 2021-03-27 DIAGNOSIS — M1712 Unilateral primary osteoarthritis, left knee: Secondary | ICD-10-CM | POA: Diagnosis not present

## 2021-03-27 DIAGNOSIS — G35 Multiple sclerosis: Secondary | ICD-10-CM | POA: Diagnosis not present

## 2021-03-27 DIAGNOSIS — M1711 Unilateral primary osteoarthritis, right knee: Secondary | ICD-10-CM

## 2021-03-27 DIAGNOSIS — Z7982 Long term (current) use of aspirin: Secondary | ICD-10-CM | POA: Diagnosis not present

## 2021-03-27 DIAGNOSIS — Z96651 Presence of right artificial knee joint: Secondary | ICD-10-CM | POA: Diagnosis not present

## 2021-03-27 DIAGNOSIS — Z471 Aftercare following joint replacement surgery: Secondary | ICD-10-CM | POA: Diagnosis not present

## 2021-03-27 DIAGNOSIS — I10 Essential (primary) hypertension: Secondary | ICD-10-CM | POA: Diagnosis not present

## 2021-03-27 NOTE — Telephone Encounter (Signed)
Patient called asked when can she start her in person (PT)?  Patient said she would like to have (PT) early in the morning.  The number to contact patient is [phone]

## 2021-03-27 NOTE — Telephone Encounter (Signed)
Referral to Dr. Ernestina Patches canceled.

## 2021-03-27 NOTE — Telephone Encounter (Signed)
Does she meet outpatient PT?  If so we can go ahead and get her set up at our office ASAP.

## 2021-03-27 NOTE — Telephone Encounter (Signed)
So sorry I put it in under Physical Medicine rehab.   I placed correct referral.   Loma Sousa, you can sign off on that referral- Error.

## 2021-03-27 NOTE — Telephone Encounter (Signed)
I do not see any referral in pt chart.

## 2021-03-27 NOTE — Telephone Encounter (Signed)
Pt is scheduled now! Thank you

## 2021-03-28 ENCOUNTER — Other Ambulatory Visit (HOSPITAL_COMMUNITY): Payer: Self-pay

## 2021-03-29 DIAGNOSIS — Z471 Aftercare following joint replacement surgery: Secondary | ICD-10-CM | POA: Diagnosis not present

## 2021-03-29 DIAGNOSIS — M1712 Unilateral primary osteoarthritis, left knee: Secondary | ICD-10-CM | POA: Diagnosis not present

## 2021-03-29 DIAGNOSIS — G35 Multiple sclerosis: Secondary | ICD-10-CM | POA: Diagnosis not present

## 2021-03-29 DIAGNOSIS — Z7982 Long term (current) use of aspirin: Secondary | ICD-10-CM | POA: Diagnosis not present

## 2021-03-29 DIAGNOSIS — I10 Essential (primary) hypertension: Secondary | ICD-10-CM | POA: Diagnosis not present

## 2021-03-29 DIAGNOSIS — Z96651 Presence of right artificial knee joint: Secondary | ICD-10-CM | POA: Diagnosis not present

## 2021-04-02 DIAGNOSIS — Z7982 Long term (current) use of aspirin: Secondary | ICD-10-CM | POA: Diagnosis not present

## 2021-04-02 DIAGNOSIS — G35 Multiple sclerosis: Secondary | ICD-10-CM | POA: Diagnosis not present

## 2021-04-02 DIAGNOSIS — I10 Essential (primary) hypertension: Secondary | ICD-10-CM | POA: Diagnosis not present

## 2021-04-02 DIAGNOSIS — Z471 Aftercare following joint replacement surgery: Secondary | ICD-10-CM | POA: Diagnosis not present

## 2021-04-02 DIAGNOSIS — Z96651 Presence of right artificial knee joint: Secondary | ICD-10-CM | POA: Diagnosis not present

## 2021-04-02 DIAGNOSIS — M1712 Unilateral primary osteoarthritis, left knee: Secondary | ICD-10-CM | POA: Diagnosis not present

## 2021-04-03 ENCOUNTER — Other Ambulatory Visit (HOSPITAL_COMMUNITY): Payer: Self-pay

## 2021-04-03 ENCOUNTER — Other Ambulatory Visit: Payer: Self-pay

## 2021-04-03 ENCOUNTER — Ambulatory Visit (INDEPENDENT_AMBULATORY_CARE_PROVIDER_SITE_OTHER): Payer: 59 | Admitting: Orthopaedic Surgery

## 2021-04-03 ENCOUNTER — Encounter: Payer: Self-pay | Admitting: Orthopaedic Surgery

## 2021-04-03 DIAGNOSIS — Z96651 Presence of right artificial knee joint: Secondary | ICD-10-CM

## 2021-04-03 MED ORDER — METHOCARBAMOL 500 MG PO TABS
500.0000 mg | ORAL_TABLET | Freq: Two times a day (BID) | ORAL | 0 refills | Status: DC | PRN
  Filled 2021-04-03: qty 40, 20d supply, fill #0

## 2021-04-03 MED ORDER — OXYCODONE-ACETAMINOPHEN 5-325 MG PO TABS
1.0000 | ORAL_TABLET | Freq: Two times a day (BID) | ORAL | 0 refills | Status: DC | PRN
  Filled 2021-04-03: qty 40, 10d supply, fill #0

## 2021-04-03 NOTE — Progress Notes (Signed)
   Post-Op Visit Note   Patient: Jill Hall           Date of Birth: [birth year]           MRN: [id number] Visit Date: 04/03/2021 PCP: Wenda Low, MD   Assessment & Plan:  Chief Complaint:  Chief Complaint  Patient presents with   Right Knee - Pain   Visit Diagnoses:  1. Status post total right knee replacement     Plan: Jem is 2-week status post right total knee replacement.  Overall doing well and progressing with home health PT.  No complaints.  Surgical incision is healed.  No signs of infection or drainage.  Range of motion 5 to 87 degrees.  Stable to varus valgus.  Moderate swelling.  No calf tenderness.  No neurovascular compromise.  Percocet and Robaxin refilled today.  Outpatient physical therapy has been set up in our office for next week.  She has 1 more session of home health PT later this week.  Sutures removed Steri-Strips applied.  Wound care instructions reviewed.  May discontinue TED hose from the nonoperative leg.  Recheck in 4 weeks with two-view x-rays of the right knee.  Follow-Up Instructions: Return in about 4 weeks (around 05/01/2021).   Orders:  No orders of the defined types were placed in this encounter.  Meds ordered this encounter  Medications   methocarbamol (ROBAXIN) 500 MG tablet    Sig: Take 1 tablet (500 mg total) by mouth 2 (two) times daily as needed.    Dispense:  40 tablet    Refill:  0   oxyCODONE-acetaminophen (PERCOCET) 5-325 MG tablet    Sig: Take 1-2 tablets by mouth 2 (two) times daily as needed. To be taken after surgery as needed    Dispense:  40 tablet    Refill:  0    Imaging: No results found.  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   Primary osteoarthritis of left knee 11/29/2020   Primary osteoarthritis of right knee 06/23/2020   Vitamin D deficiency 01/29/2016   Optic neuritis 10/24/2014   Other fatigue 10/24/2014   High  risk medication use 10/19/2014   Contraception management 08/05/2011   Multiple sclerosis ([place]) 08/26/1997   Past Medical History:  Diagnosis Date   Arthritis    both knees   Hypertension    Multiple sclerosis ([place])    Neuromuscular disorder ([place])    Multiple Sclerosis- affects her vision only    Family History  Problem Relation Age of Onset   Breast cancer Mother 53    Past Surgical History:  Procedure Laterality Date   CESAREAN SECTION     TOTAL KNEE ARTHROPLASTY Right 03/19/2021   Procedure: RIGHT TOTAL KNEE ARTHROPLASTY;  Surgeon: Leandrew Koyanagi, MD;  Location: [facility];  Service: Orthopedics;  Laterality: Right;   UTERINE FIBROID SURGERY     Social History   Occupational History   Not on file  Tobacco Use   Smoking status: Never   Smokeless tobacco: Never  Vaping Use   Vaping Use: Never used  Substance and Sexual Activity   Alcohol use: No   Drug use: No   Sexual activity: Not on file

## 2021-04-05 DIAGNOSIS — Z7982 Long term (current) use of aspirin: Secondary | ICD-10-CM | POA: Diagnosis not present

## 2021-04-05 DIAGNOSIS — G35 Multiple sclerosis: Secondary | ICD-10-CM | POA: Diagnosis not present

## 2021-04-05 DIAGNOSIS — M1712 Unilateral primary osteoarthritis, left knee: Secondary | ICD-10-CM | POA: Diagnosis not present

## 2021-04-05 DIAGNOSIS — I10 Essential (primary) hypertension: Secondary | ICD-10-CM | POA: Diagnosis not present

## 2021-04-05 DIAGNOSIS — Z96651 Presence of right artificial knee joint: Secondary | ICD-10-CM | POA: Diagnosis not present

## 2021-04-05 DIAGNOSIS — Z471 Aftercare following joint replacement surgery: Secondary | ICD-10-CM | POA: Diagnosis not present

## 2021-04-12 ENCOUNTER — Ambulatory Visit (INDEPENDENT_AMBULATORY_CARE_PROVIDER_SITE_OTHER): Payer: 59 | Admitting: Rehabilitative and Restorative Service Providers"

## 2021-04-12 ENCOUNTER — Other Ambulatory Visit: Payer: Self-pay

## 2021-04-12 DIAGNOSIS — R600 Localized edema: Secondary | ICD-10-CM

## 2021-04-12 DIAGNOSIS — M25561 Pain in right knee: Secondary | ICD-10-CM | POA: Diagnosis not present

## 2021-04-12 DIAGNOSIS — R262 Difficulty in walking, not elsewhere classified: Secondary | ICD-10-CM | POA: Diagnosis not present

## 2021-04-12 DIAGNOSIS — M6281 Muscle weakness (generalized): Secondary | ICD-10-CM

## 2021-04-12 DIAGNOSIS — M25661 Stiffness of right knee, not elsewhere classified: Secondary | ICD-10-CM | POA: Diagnosis not present

## 2021-04-12 DIAGNOSIS — G8929 Other chronic pain: Secondary | ICD-10-CM | POA: Diagnosis not present

## 2021-04-12 NOTE — Therapy (Signed)
[facility] [address] [place], [place], [address] Phone: [phone]   Fax:  [phone]  Physical Therapy Evaluation  Patient Details  Name: Jill Hall MRN: [id number] Date of Birth: [birth year] Referring Provider (PT): Dr. Frankey Shown   Encounter Date: 04/12/2021   PT End of Session - 04/12/21 0758     Visit Number 1    Number of Visits 20    Date for PT Re-Evaluation 06/21/21    Authorization Type [facility] [employer], [employer]BS    Progress Note Due on Visit 10    PT Start Time 0802    PT Stop Time 0838    PT Time Calculation (min) 36 min    Activity Tolerance Patient tolerated treatment well    Behavior During Therapy [facility] for tasks assessed/performed             Past Medical History:  Diagnosis Date   Arthritis    both knees   Hypertension    Multiple sclerosis ([place])    Neuromuscular disorder ([place])    Multiple Sclerosis- affects her vision only    Past Surgical History:  Procedure Laterality Date   CESAREAN SECTION     TOTAL KNEE ARTHROPLASTY Right 03/19/2021   Procedure: RIGHT TOTAL KNEE ARTHROPLASTY;  Surgeon: Leandrew Koyanagi, MD;  Location: [place];  Service: Orthopedics;  Laterality: Right;   UTERINE FIBROID SURGERY      There were no vitals filed for this visit.    Subjective Assessment - 04/12/21 0805     Subjective Pt. indicated about 2 years of trouble prior to surgery.  Rt TKA 03/19/2021.  Was walking independent prior to surgery.  Pt. stated using FWW at this time mostly since surgery.  Has cane and has tried to use it some.    Limitations Standing;Walking;House hold activities;Lifting;Sitting    Patient Stated Goals Reduce pain, walk independently    Currently in Pain? Yes    Pain Score 5     Pain Location Knee    Pain Orientation Right    Pain Descriptors / Indicators Tightness;Sharp;Stabbing    Pain Type Chronic pain;Surgical pain    Pain Onset More than a month ago    Pain Frequency Intermittent    Aggravating  Factors  worse in morning, inactivity stiffness    Pain Relieving Factors CPM machine loosens up, gentle movement, icing                [facility] PT Assessment - 04/12/21 0001       Assessment   Medical Diagnosis OA Rt knee, s/p Rt TKA    Referring Provider (PT) Dr. Frankey Shown    Onset Date/Surgical Date 03/19/21      Precautions   Precautions None      Restrictions   Weight Bearing Restrictions No      Balance Screen   Has the patient fallen in the past 6 months No    Has the patient had a decrease in activity level because of a fear of falling?  No    Is the patient reluctant to leave their home because of a fear of falling?  No      Home Environment   Additional Comments 2 story house, master on first floor.  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    Leisure Walking, house activity, going to track meets/travel to from daughter, shopping  Observation/Other Assessments   Focus on Therapeutic Outcomes (FOTO)  Intake 53%, predicted 60%      Functional Tests   Functional tests Single leg stance;Sit to Stand      Single Leg Stance   Comments Rt < 3 seconds, Lt 5 seconds      Sit to Stand   Comments able to perform from 18 inch chair no UE c marked shift to Lt leg      ROM / Strength   AROM / PROM / Strength Strength;PROM;AROM      AROM   AROM Assessment Site Knee    Right/Left Knee Left;Right    Right Knee Extension -7   in LAQ   Right Knee Flexion 91   in supine heel slide   Left Knee Extension 0      PROM   PROM Assessment Site Knee    Right/Left Knee Left;Right    Right Knee Extension -5   in supine heel prop   Right Knee Flexion 92   in supine heel slide overpressure     Strength   Strength Assessment Site Hip;Knee;Ankle    Right/Left Hip Left;Right    Right Hip Flexion 4/5    Left Hip Flexion 5/5    Right/Left Knee Left;Right    Right Knee Flexion 5/5    Right Knee  Extension 4/5    Left Knee Flexion 5/5    Left Knee Extension 5/5    Right/Left Ankle Left;Right    Right Ankle Dorsiflexion 5/5    Left Ankle Dorsiflexion 5/5      Ambulation/Gait   Gait Comments Ambulation c FWW, step through gait pattern.  Lacking full extension on Rt knee                        Objective measurements completed on examination: See above findings.       [facility] Adult PT Treatment/Exercise - 04/12/21 0001       Exercises   Exercises Knee/Hip;Other Exercises    Other Exercises  HEP instruction/performance c cues for techniques, handout provided.  Trial set performed of each for comprehension and symptom assessment.      Knee/Hip Exercises: Stretches   Other Knee/Hip Stretches supine active Rt knee heel slide c overpressure at end 10 sec x 3    Other Knee/Hip Stretches supine heel prop 3 mins      Knee/Hip Exercises: Seated   Long Arc Quad AROM;10 reps   c 1 sec pause in flexion and extension end ranges, contralateral leg movement opposite   Other Seated Knee/Hip Exercises seated Lt leg overpressure for Rt knee flexion 10 sec hold x 2      Knee/Hip Exercises: Supine   Quad Sets 5 reps;Right   5 sec hold                   PT Education - 04/12/21 0831     Education Details HEP, POC    Person(s) Educated Patient    Methods Explanation;Demonstration;Verbal cues;Handout    Comprehension Returned demonstration;Verbalized understanding              PT Short Term Goals - 04/12/21 0758       PT SHORT TERM GOAL #1   Title Patient will demonstrate independent use of home exercise program to maintain progress from in clinic treatments.    Time 3    Period Weeks    Status New    Target Date 05/03/21  PT Long Term Goals - 04/12/21 0758       PT LONG TERM GOAL #1   Title Patient will demonstrate/report pain at worst less than or equal to 2/10 to facilitate minimal limitation in daily activity secondary to pain  symptoms.    Time 10    Period Weeks    Status New    Target Date 06/21/21      PT LONG TERM GOAL #2   Title Patient will demonstrate independent use of home exercise program to facilitate ability to maintain/progress functional gains from skilled physical therapy services.    Time 10    Period Weeks    Status New    Target Date 06/21/21      PT LONG TERM GOAL #3   Title Patient will demonstrate independent ambulation community distances > 300 ft to facilitate community integration at [facility].    Time 10    Period Weeks    Status New    Target Date 06/21/21      PT LONG TERM GOAL #4   Title Patient will demonstrate Rt knee AROM 0-110 degrees to facilitate ability to perform transfers, sitting, ambulation, stair navigation s restriction due to mobility.    Time 10    Period Weeks    Status New    Target Date 06/21/21      PT LONG TERM GOAL #5   Title Patient will demonstrate Rt LE MMT 5/5 throughout to facilitate ability to perform usual standing, walking, stairs at PLOF s limitation due to symptoms.    Time 10    Period Weeks    Status New    Target Date 06/21/21      Additional Long Term Goals   Additional Long Term Goals Yes      PT LONG TERM GOAL #6   Title Pt. will demonstrate FOTO outcome > or = 60% to indicated reduced disability due to condition.    Time 10    Period Weeks    Status New    Target Date 06/21/21      PT LONG TERM GOAL #7   Title Pt. will demonstrate reciprocal gait pattern s UE assist for household and football stadium navigation.    Time 10    Period Weeks    Status New    Target Date 06/21/21                    Plan - 04/12/21 0800     Clinical Impression Statement Patient is a 60 y.o. who comes to clinic with complaints of Rt knee pain with mobility, strength and movement coordination deficits that impair their ability to perform usual daily and recreational functional activities without increase difficulty/symptoms at this time.   Patient to benefit from skilled PT services to address impairments and limitations to improve to previous level of function without restriction secondary to condition.    Personal Factors and Comorbidities Comorbidity 2    Comorbidities HTN, MS    Examination-Activity Limitations Sit;Sleep;Squat;Bend;Stairs;Carry;Stand;Transfers;Lift;Locomotion Level    Examination-Participation Restrictions Community Activity;Cleaning;Shop;Driving;Interpersonal Relationship;Yard Work;Laundry;Meal Prep;Occupation    Stability/Clinical Decision Making Stable/Uncomplicated    Clinical Decision Making Low    Rehab Potential Good    PT Frequency 2x / week    PT Duration Other (comment)   10 weeks   PT Treatment/Interventions ADLs/Self Care Home Management;Cryotherapy;Electrical Stimulation;Iontophoresis '4mg'$ /ml Dexamethasone;Moist Heat;Balance training;Therapeutic exercise;Therapeutic activities;Functional mobility training;Stair training;Gait training;Ultrasound;Neuromuscular re-education;Patient/family education;Passive range of motion;Dry needling;Vasopneumatic Device;Manual techniques    PT Next  Visit Plan Manual for flexion gains, continued quad activation improvements, static balance, vaso    PT Home Exercise Plan [email or web address]    Consulted and Agree with Plan of Care Patient             Patient will benefit from skilled therapeutic intervention in order to improve the following deficits and impairments:  Abnormal gait, Hypomobility, Increased edema, Decreased activity tolerance, Decreased strength, Increased fascial restricitons, Pain, Difficulty walking, Decreased mobility, Decreased balance, Decreased range of motion, Impaired perceived functional ability, Improper body mechanics, Impaired flexibility, Decreased coordination  Visit Diagnosis: Chronic pain of right knee  Muscle weakness (generalized)  Stiffness of right knee, not elsewhere classified  Difficulty in walking, not elsewhere  classified  Localized edema     Problem List Patient Active Problem List   Diagnosis Date Noted   Status post total right knee replacement 03/19/2021   Primary osteoarthritis of left knee 11/29/2020   Primary osteoarthritis of right knee 06/23/2020   Vitamin D deficiency 01/29/2016   Optic neuritis 10/24/2014   Other fatigue 10/24/2014   High risk medication use 10/19/2014   Contraception management 08/05/2011   Multiple sclerosis ([place]) 08/26/1997    Scot Jun, PT, DPT, OCS, ATC 04/12/21  9:06 AM   [facility] [address] [place], [place], [address] Phone: [phone]   Fax:  [phone]  Name: Jill Hall MRN: [id number] Date of Birth: 1961-02-17

## 2021-04-12 NOTE — Patient Instructions (Signed)
Access Code: [email or web address] URL: [email or web address] Date: 04/12/2021 Prepared by: Scot Jun  Exercises Supine Heel Slide - 2 x daily - 7 x weekly - 3 sets - 10 reps - 2 hold Seated Long Arc Quad - 2 x daily - 7 x weekly - 3 sets - 10 reps - 2 hold Supine Knee Extension Mobilization with Weight - 4 x daily - 7 x weekly - 1 sets - 1 reps - up to 15 mins hold Supine Quad Set (Mirrored) - 2 x daily - 7 x weekly - 1 sets - 10 reps - 5 hold

## 2021-04-13 NOTE — Addendum Note (Signed)
Encounter addended by: Annie Paras on: 04/13/2021 11:44 AM  Actions taken: Letter saved

## 2021-04-17 ENCOUNTER — Other Ambulatory Visit: Payer: Self-pay

## 2021-04-17 ENCOUNTER — Encounter: Payer: Self-pay | Admitting: Rehabilitative and Restorative Service Providers"

## 2021-04-17 ENCOUNTER — Ambulatory Visit (INDEPENDENT_AMBULATORY_CARE_PROVIDER_SITE_OTHER): Payer: 59 | Admitting: Rehabilitative and Restorative Service Providers"

## 2021-04-17 DIAGNOSIS — G8929 Other chronic pain: Secondary | ICD-10-CM | POA: Diagnosis not present

## 2021-04-17 DIAGNOSIS — M25561 Pain in right knee: Secondary | ICD-10-CM

## 2021-04-17 DIAGNOSIS — M25661 Stiffness of right knee, not elsewhere classified: Secondary | ICD-10-CM | POA: Diagnosis not present

## 2021-04-17 DIAGNOSIS — R600 Localized edema: Secondary | ICD-10-CM

## 2021-04-17 DIAGNOSIS — M6281 Muscle weakness (generalized): Secondary | ICD-10-CM

## 2021-04-17 DIAGNOSIS — R262 Difficulty in walking, not elsewhere classified: Secondary | ICD-10-CM

## 2021-04-17 NOTE — Therapy (Signed)
[facility] [address] [place], [place], [address] Phone: [phone]   Fax:  [phone]  Physical Therapy Treatment  Patient Details  Name: Jill Hall MRN: [id number] Date of Birth: Dec 29, 1960 Referring Provider (PT): Dr. Frankey Shown   Encounter Date: 04/17/2021   PT End of Session - 04/17/21 0842     Visit Number 2    Number of Visits 20    Date for PT Re-Evaluation 06/21/21    Authorization Type [facility], BCBS    Progress Note Due on Visit 10    PT Start Time [phone]    PT Stop Time 0933    PT Time Calculation (min) 50 min    Activity Tolerance Patient tolerated treatment well    Behavior During Therapy [facility] for tasks assessed/performed             Past Medical History:  Diagnosis Date   Arthritis    both knees   Hypertension    Multiple sclerosis ([place])    Neuromuscular disorder ([place])    Multiple Sclerosis- affects her vision only    Past Surgical History:  Procedure Laterality Date   CESAREAN SECTION     TOTAL KNEE ARTHROPLASTY Right 03/19/2021   Procedure: RIGHT TOTAL KNEE ARTHROPLASTY;  Surgeon: Leandrew Koyanagi, MD;  Location: [place];  Service: Orthopedics;  Laterality: Right;   UTERINE FIBROID SURGERY      There were no vitals filed for this visit.   Subjective Assessment - 04/17/21 0845     Subjective Pt. indicated feeling real stiff, more so in morning.  No more CPM at home.    Limitations Standing;Walking;House hold activities;Lifting;Sitting    Patient Stated Goals Reduce pain, walk independently    Currently in Pain? No/denies    Pain Score 0-No pain    Pain Location Knee    Pain Orientation Right    Pain Descriptors / Indicators Tightness    Pain Type Chronic pain;Surgical pain    Pain Onset More than a month ago    Pain Frequency Intermittent    Aggravating Factors  inactivity, morning complaints    Pain Relieving Factors some movement helps                [facility] PT Assessment - 04/17/21 0001        Assessment   Medical Diagnosis OA Rt knee, s/p Rt TKA    Referring Provider (PT) Dr. Frankey Shown    Onset Date/Surgical Date 03/19/21      PROM   Right Knee Extension -5   in supine heel prop   Right Knee Flexion 105   in supine heel slide over pressure                          [facility] Adult PT Treatment/Exercise - 04/17/21 0001       Knee/Hip Exercises: Stretches   Gastroc Stretch 30 seconds;3 reps;Both   incline board   Other Knee/Hip Stretches supine active Rt knee heel slide c overpressure at end 10 sec x 10    Other Knee/Hip Stretches supine heel prop 3 mins      Knee/Hip Exercises: Aerobic   Recumbent Bike Partial Circles seat 6 6 mins      Knee/Hip Exercises: Seated   Long Arc Quad Right;AROM   3 x 10 c 1 sec pause in extension and flexion c contralateral leg movement opposite   Long Arc Quad Weight 2 lbs.  Knee/Hip Exercises: Supine   Straight Leg Raises Right;2 sets;10 reps      Modalities   Modalities Vasopneumatic      Vasopneumatic   Number Minutes Vasopneumatic  10 minutes    Vasopnuematic Location  Knee   Rt in elevation   Vasopneumatic Pressure Medium    Vasopneumatic Temperature  34      Manual Therapy   Manual therapy comments seated Rt knee flexion c mobilization in movmeent distraction/IR c contralateral leg movement opposite                      PT Short Term Goals - 04/17/21 [id number]       PT SHORT TERM GOAL #1   Title Patient will demonstrate independent use of home exercise program to maintain progress from in clinic treatments.    Time 3    Period Weeks    Status On-going    Target Date 05/03/21               PT Long Term Goals - 04/12/21 0758       PT LONG TERM GOAL #1   Title Patient will demonstrate/report pain at worst less than or equal to 2/10 to facilitate minimal limitation in daily activity secondary to pain symptoms.    Time 10    Period Weeks    Status New    Target Date 06/21/21       PT LONG TERM GOAL #2   Title Patient will demonstrate independent use of home exercise program to facilitate ability to maintain/progress functional gains from skilled physical therapy services.    Time 10    Period Weeks    Status New    Target Date 06/21/21      PT LONG TERM GOAL #3   Title Patient will demonstrate independent ambulation community distances > 300 ft to facilitate community integration at [facility].    Time 10    Period Weeks    Status New    Target Date 06/21/21      PT LONG TERM GOAL #4   Title Patient will demonstrate Rt knee AROM 0-110 degrees to facilitate ability to perform transfers, sitting, ambulation, stair navigation s restriction due to mobility.    Time 10    Period Weeks    Status New    Target Date 06/21/21      PT LONG TERM GOAL #5   Title Patient will demonstrate Rt LE MMT 5/5 throughout to facilitate ability to perform usual standing, walking, stairs at PLOF s limitation due to symptoms.    Time 10    Period Weeks    Status New    Target Date 06/21/21      Additional Long Term Goals   Additional Long Term Goals Yes      PT LONG TERM GOAL #6   Title Pt. will demonstrate FOTO outcome > or = 60% to indicated reduced disability due to condition.    Time 10    Period Weeks    Status New    Target Date 06/21/21      PT LONG TERM GOAL #7   Title Pt. will demonstrate reciprocal gait pattern s UE assist for household and football stadium navigation.    Time 10    Period Weeks    Status New    Target Date 06/21/21                   Plan - 04/17/21  0854     Clinical Impression Statement Good overall recall of HEP at this time.  Pt. contiued to present c end range mobility deficits in Rt knee c strength deficits that impair functional walking, standing, and other daily activity from PLOF at this time.  Recommended continued skilled PT services.  Ambulation today c SPC c periodical carrying noted.    Personal Factors and Comorbidities  Comorbidity 2    Comorbidities HTN, MS    Examination-Activity Limitations Sit;Sleep;Squat;Bend;Stairs;Carry;Stand;Transfers;Lift;Locomotion Level    Examination-Participation Restrictions Community Activity;Cleaning;Shop;Driving;Interpersonal Relationship;Yard Work;Laundry;Meal Prep;Occupation    Stability/Clinical Decision Making Stable/Uncomplicated    Rehab Potential Good    PT Frequency 2x / week    PT Duration Other (comment)   10 weeks   PT Treatment/Interventions ADLs/Self Care Home Management;Cryotherapy;Electrical Stimulation;Iontophoresis '4mg'$ /ml Dexamethasone;Moist Heat;Balance training;Therapeutic exercise;Therapeutic activities;Functional mobility training;Stair training;Gait training;Ultrasound;Neuromuscular re-education;Patient/family education;Passive range of motion;Dry needling;Vasopneumatic Device;Manual techniques    PT Next Visit Plan Continued mobility gains(manual, ther ex), continued quad activation improvements, static balance, vaso    PT Home Exercise Plan [email or web address]    Consulted and Agree with Plan of Care Patient             Patient will benefit from skilled therapeutic intervention in order to improve the following deficits and impairments:  Abnormal gait, Hypomobility, Increased edema, Decreased activity tolerance, Decreased strength, Increased fascial restricitons, Pain, Difficulty walking, Decreased mobility, Decreased balance, Decreased range of motion, Impaired perceived functional ability, Improper body mechanics, Impaired flexibility, Decreased coordination  Visit Diagnosis: Chronic pain of right knee  Muscle weakness (generalized)  Stiffness of right knee, not elsewhere classified  Difficulty in walking, not elsewhere classified  Localized edema     Problem List Patient Active Problem List   Diagnosis Date Noted   Status post total right knee replacement 03/19/2021   Primary osteoarthritis of left knee 11/29/2020   Primary osteoarthritis of  right knee 06/23/2020   Vitamin D deficiency 01/29/2016   Optic neuritis 10/24/2014   Other fatigue 10/24/2014   High risk medication use 10/19/2014   Contraception management 08/05/2011   Multiple sclerosis ([place]) 08/26/1997    Scot Jun, PT, DPT, OCS, ATC 04/17/21  9:23 AM    [facility] [address] [place], [place], [address] Phone: [phone]   Fax:  [phone]  Name: Averi Liesch MRN: [id number] Date of Birth: 03-31-1961

## 2021-04-18 ENCOUNTER — Other Ambulatory Visit (HOSPITAL_COMMUNITY): Payer: Self-pay

## 2021-04-18 ENCOUNTER — Ambulatory Visit (INDEPENDENT_AMBULATORY_CARE_PROVIDER_SITE_OTHER): Payer: 59 | Admitting: Physical Therapy

## 2021-04-18 ENCOUNTER — Encounter: Payer: Self-pay | Admitting: Physical Therapy

## 2021-04-18 DIAGNOSIS — G8929 Other chronic pain: Secondary | ICD-10-CM

## 2021-04-18 DIAGNOSIS — M25561 Pain in right knee: Secondary | ICD-10-CM | POA: Diagnosis not present

## 2021-04-18 DIAGNOSIS — R262 Difficulty in walking, not elsewhere classified: Secondary | ICD-10-CM

## 2021-04-18 DIAGNOSIS — M6281 Muscle weakness (generalized): Secondary | ICD-10-CM

## 2021-04-18 DIAGNOSIS — R600 Localized edema: Secondary | ICD-10-CM | POA: Diagnosis not present

## 2021-04-18 DIAGNOSIS — M25661 Stiffness of right knee, not elsewhere classified: Secondary | ICD-10-CM | POA: Diagnosis not present

## 2021-04-18 NOTE — Therapy (Signed)
[facility] [address] [place], [place], [address] Phone: [phone]   Fax:  [phone]  Physical Therapy Treatment  Patient Details  Name: Jill Hall MRN: [id number] Date of Birth: 1961/03/25 Referring Provider (PT): Dr. Frankey Shown   Encounter Date: 04/18/2021   PT End of Session - 04/18/21 1011     Visit Number 3    Number of Visits 20    Date for PT Re-Evaluation 06/21/21    Authorization Type [facility], BCBS    Progress Note Due on Visit 10    PT Start Time 1012    PT Stop Time 1105    PT Time Calculation (min) 53 min    Activity Tolerance Patient tolerated treatment well    Behavior During Therapy WFL for tasks assessed/performed             Past Medical History:  Diagnosis Date   Arthritis    both knees   Hypertension    Multiple sclerosis ([place])    Neuromuscular disorder ([place])    Multiple Sclerosis- affects her vision only    Past Surgical History:  Procedure Laterality Date   CESAREAN SECTION     TOTAL KNEE ARTHROPLASTY Right 03/19/2021   Procedure: RIGHT TOTAL KNEE ARTHROPLASTY;  Surgeon: Leandrew Koyanagi, MD;  Location: [place];  Service: Orthopedics;  Laterality: Right;   UTERINE FIBROID SURGERY      There were no vitals filed for this visit.   Subjective Assessment - 04/18/21 1012     Subjective She has been doing her exercises.  Sleeping is still difficult but has improved up to 2-3 hours.    Limitations Standing;Walking;House hold activities;Lifting;Sitting    Patient Stated Goals Reduce pain, walk independently    Currently in Pain? No/denies   just tightness   Pain Onset More than a month ago                [facility] PT Assessment - 04/18/21 0001       Assessment   Medical Diagnosis OA Rt knee, s/p Rt TKA    Referring Provider (PT) Dr. Frankey Shown    Onset Date/Surgical Date 03/19/21      AROM   Right Knee Extension -6   seated LAQ   Right Knee Flexion 97   supine heel slide                           [facility] Adult PT Treatment/Exercise - 04/18/21 1012       Self-Care   Self-Care ADL's;Scar Mobilizations;Heat/Ice Application    ADL's PT educated verbally in positioning with pillows in bed sidelying & "tenting" sheets off feet.  pt verbalized understanding.    Scar Mobilizations PT instructed with demo & verbal cues with handout on scar mobilizations.  Pt verbalized understanding.    Heat/Ice Application PT instructed with demo & verbal cues with handout on ice massage. pt verbalized & return demo understanding.      Knee/Hip Exercises: Stretches   Press photographer --    Other Knee/Hip Stretches supine active Rt knee heel slide c overpressure at end 10 sec x 10    Other Knee/Hip Stretches supine heel prop 3 mins      Knee/Hip Exercises: Aerobic   Recumbent Bike Partial Circles seat 6 6 mins      Knee/Hip Exercises: Seated   Long Arc Quad Right;AROM   3 x 10 c 1 sec pause in extension and  flexion c contralateral leg movement opposite   Long Arc Quad Weight 2 lbs.      Knee/Hip Exercises: Supine   Quad Sets 2 sets;10 reps;Right;AROM    Quad Sets Limitations positioned in TKE prop stretch    Heel Slides AAROM;Right;2 sets;10 reps    Heel Slides Limitations foot on swiss ball with strap assistance    Straight Leg Raises --      Modalities   Modalities Vasopneumatic      Vasopneumatic   Number Minutes Vasopneumatic  10 minutes    Vasopnuematic Location  Knee   Rt in elevation   Vasopneumatic Pressure Medium    Vasopneumatic Temperature  34      Manual Therapy   Manual therapy comments seated Rt knee flexion c mobilization in movmeent distraction/IR c contralateral leg movement opposite                      PT Short Term Goals - 04/17/21 [id number]       PT SHORT TERM GOAL #1   Title Patient will demonstrate independent use of home exercise program to maintain progress from in clinic treatments.    Time 3    Period Weeks    Status  On-going    Target Date 05/03/21               PT Long Term Goals - 04/12/21 0758       PT LONG TERM GOAL #1   Title Patient will demonstrate/report pain at worst less than or equal to 2/10 to facilitate minimal limitation in daily activity secondary to pain symptoms.    Time 10    Period Weeks    Status New    Target Date 06/21/21      PT LONG TERM GOAL #2   Title Patient will demonstrate independent use of home exercise program to facilitate ability to maintain/progress functional gains from skilled physical therapy services.    Time 10    Period Weeks    Status New    Target Date 06/21/21      PT LONG TERM GOAL #3   Title Patient will demonstrate independent ambulation community distances > 300 ft to facilitate community integration at [facility].    Time 10    Period Weeks    Status New    Target Date 06/21/21      PT LONG TERM GOAL #4   Title Patient will demonstrate Rt knee AROM 0-110 degrees to facilitate ability to perform transfers, sitting, ambulation, stair navigation s restriction due to mobility.    Time 10    Period Weeks    Status New    Target Date 06/21/21      PT LONG TERM GOAL #5   Title Patient will demonstrate Rt LE MMT 5/5 throughout to facilitate ability to perform usual standing, walking, stairs at PLOF s limitation due to symptoms.    Time 10    Period Weeks    Status New    Target Date 06/21/21      Additional Long Term Goals   Additional Long Term Goals Yes      PT LONG TERM GOAL #6   Title Pt. will demonstrate FOTO outcome > or = 60% to indicated reduced disability due to condition.    Time 10    Period Weeks    Status New    Target Date 06/21/21      PT LONG TERM GOAL #7   Title  Pt. will demonstrate reciprocal gait pattern s UE assist for household and football stadium navigation.    Time 10    Period Weeks    Status New    Target Date 06/21/21                   Plan - 04/18/21 1012     Clinical Impression Statement  Pt reports she felt improved range with ball & strap for supine heel slides.  PT instructed in ice massage & scar mobilizations which she appears to understand.  Her AROM is improving.    Personal Factors and Comorbidities Comorbidity 2    Comorbidities HTN, MS    Examination-Activity Limitations Sit;Sleep;Squat;Bend;Stairs;Carry;Stand;Transfers;Lift;Locomotion Level    Examination-Participation Restrictions Community Activity;Cleaning;Shop;Driving;Interpersonal Relationship;Yard Work;Laundry;Meal Prep;Occupation    Stability/Clinical Decision Making Stable/Uncomplicated    Rehab Potential Good    PT Frequency 2x / week    PT Duration Other (comment)   10 weeks   PT Treatment/Interventions ADLs/Self Care Home Management;Cryotherapy;Electrical Stimulation;Iontophoresis '4mg'$ /ml Dexamethasone;Moist Heat;Balance training;Therapeutic exercise;Therapeutic activities;Functional mobility training;Stair training;Gait training;Ultrasound;Neuromuscular re-education;Patient/family education;Passive range of motion;Dry needling;Vasopneumatic Device;Manual techniques    PT Next Visit Plan add standing exercises to clinic exercises, Continued mobility gains(manual, ther ex), continued quad activation improvements, static balance, vaso    PT Home Exercise Plan [id number]    Consulted and Agree with Plan of Care Patient             Patient will benefit from skilled therapeutic intervention in order to improve the following deficits and impairments:  Abnormal gait, Hypomobility, Increased edema, Decreased activity tolerance, Decreased strength, Increased fascial restricitons, Pain, Difficulty walking, Decreased mobility, Decreased balance, Decreased range of motion, Impaired perceived functional ability, Improper body mechanics, Impaired flexibility, Decreased coordination  Visit Diagnosis: Chronic pain of right knee  Muscle weakness (generalized)  Stiffness of right knee, not elsewhere  classified  Difficulty in walking, not elsewhere classified  Localized edema     Problem List Patient Active Problem List   Diagnosis Date Noted   Status post total right knee replacement 03/19/2021   Primary osteoarthritis of left knee 11/29/2020   Primary osteoarthritis of right knee 06/23/2020   Vitamin D deficiency 01/29/2016   Optic neuritis 10/24/2014   Other fatigue 10/24/2014   High risk medication use 10/19/2014   Contraception management 08/05/2011   Multiple sclerosis ([place]) 08/26/1997    Jamey Reas, PT, DPT 04/18/2021, 11:19 AM  [facility] [address] [place], [place], [address] Phone: [phone]   Fax:  [phone]  Name: Jill Hall MRN: [id number] Date of Birth: 1961-03-14

## 2021-04-19 ENCOUNTER — Other Ambulatory Visit: Payer: Self-pay | Admitting: Internal Medicine

## 2021-04-19 DIAGNOSIS — R9389 Abnormal findings on diagnostic imaging of other specified body structures: Secondary | ICD-10-CM

## 2021-04-23 ENCOUNTER — Other Ambulatory Visit (HOSPITAL_COMMUNITY): Payer: Self-pay

## 2021-04-23 ENCOUNTER — Ambulatory Visit (INDEPENDENT_AMBULATORY_CARE_PROVIDER_SITE_OTHER): Payer: 59 | Admitting: Rehabilitative and Restorative Service Providers"

## 2021-04-23 ENCOUNTER — Other Ambulatory Visit: Payer: Self-pay

## 2021-04-23 ENCOUNTER — Encounter: Payer: Self-pay | Admitting: Rehabilitative and Restorative Service Providers"

## 2021-04-23 DIAGNOSIS — M25561 Pain in right knee: Secondary | ICD-10-CM | POA: Diagnosis not present

## 2021-04-23 DIAGNOSIS — R600 Localized edema: Secondary | ICD-10-CM | POA: Diagnosis not present

## 2021-04-23 DIAGNOSIS — M25661 Stiffness of right knee, not elsewhere classified: Secondary | ICD-10-CM | POA: Diagnosis not present

## 2021-04-23 DIAGNOSIS — G8929 Other chronic pain: Secondary | ICD-10-CM | POA: Diagnosis not present

## 2021-04-23 DIAGNOSIS — M6281 Muscle weakness (generalized): Secondary | ICD-10-CM

## 2021-04-23 DIAGNOSIS — R262 Difficulty in walking, not elsewhere classified: Secondary | ICD-10-CM | POA: Diagnosis not present

## 2021-04-23 MED FILL — Atorvastatin Calcium Tab 10 MG (Base Equivalent): ORAL | 90 days supply | Qty: 90 | Fill #1 | Status: AC

## 2021-04-23 NOTE — Therapy (Signed)
[facility] [address] [place], [place], [address] Phone: [phone]   Fax:  [phone]  Physical Therapy Treatment  Patient Details  Name: Jill Hall MRN: [id number] Date of Birth: [birth year] Referring Provider (PT): Dr. Frankey Shown   Encounter Date: 04/23/2021   PT End of Session - 04/23/21 1513     Visit Number 4    Number of Visits 20    Date for PT Re-Evaluation 06/21/21    Authorization Type [facility], BCBS    Progress Note Due on Visit 10    PT Start Time 1508    PT Stop Time 1558    PT Time Calculation (min) 50 min    Activity Tolerance Patient tolerated treatment well    Behavior During Therapy [facility] for tasks assessed/performed             Past Medical History:  Diagnosis Date   Arthritis    both knees   Hypertension    Multiple sclerosis ([place])    Neuromuscular disorder ([place])    Multiple Sclerosis- affects her vision only    Past Surgical History:  Procedure Laterality Date   CESAREAN SECTION     TOTAL KNEE ARTHROPLASTY Right 03/19/2021   Procedure: RIGHT TOTAL KNEE ARTHROPLASTY;  Surgeon: Leandrew Koyanagi, MD;  Location: [place];  Service: Orthopedics;  Laterality: Right;   UTERINE FIBROID SURGERY      There were no vitals filed for this visit.   Subjective Assessment - 04/23/21 1511     Subjective Pt. indicated pain in medial and anterior joint line as pain area.  Focused primarily about stiffness in morning and at night.  Slept 4 hours straight.    Limitations Standing;Walking;House hold activities;Lifting;Sitting    Patient Stated Goals Reduce pain, walk independently    Currently in Pain? Yes    Pain Score 4     Pain Location Knee    Pain Orientation Right    Pain Descriptors / Indicators Tightness;Sore    Pain Type Chronic pain;Surgical pain    Pain Onset More than a month ago    Pain Frequency Intermittent    Aggravating Factors  stiffness in morning    Pain Relieving Factors movement helps.                 [facility] PT Assessment - 04/23/21 0001       AROM   Right Knee Flexion 104   in supine heel slide                          [facility] Adult PT Treatment/Exercise - 04/23/21 0001       Neuro Re-ed    Neuro Re-ed Details  tandem stance 1 min x 2 bilateral      Knee/Hip Exercises: Stretches   Gastroc Stretch 60 seconds;2 reps;Both   incline board   Other Knee/Hip Stretches supine heel slide x 10 Rt      Knee/Hip Exercises: Aerobic   Recumbent Bike Variable fwd/ and reverse full circles seat 6 10 mins      Knee/Hip Exercises: Machines for Strengthening   Cybex Knee Extension eccentric lowering Rt 5 lbs 2 x 10   (try to increase weight next visit)     Vasopneumatic   Number Minutes Vasopneumatic  10 minutes    Vasopnuematic Location  Knee    Vasopneumatic Pressure Medium    Vasopneumatic Temperature  34      Manual Therapy  Manual therapy comments seated Rt knee flexion c mobilization in movmeent distraction/IR c contralateral leg movement opposite                      PT Short Term Goals - 04/17/21 [id number]       PT SHORT TERM GOAL #1   Title Patient will demonstrate independent use of home exercise program to maintain progress from in clinic treatments.    Time 3    Period Weeks    Status On-going    Target Date 05/03/21               PT Long Term Goals - 04/12/21 0758       PT LONG TERM GOAL #1   Title Patient will demonstrate/report pain at worst less than or equal to 2/10 to facilitate minimal limitation in daily activity secondary to pain symptoms.    Time 10    Period Weeks    Status New    Target Date 06/21/21      PT LONG TERM GOAL #2   Title Patient will demonstrate independent use of home exercise program to facilitate ability to maintain/progress functional gains from skilled physical therapy services.    Time 10    Period Weeks    Status New    Target Date 06/21/21      PT LONG TERM GOAL #3   Title  Patient will demonstrate independent ambulation community distances > 300 ft to facilitate community integration at [facility].    Time 10    Period Weeks    Status New    Target Date 06/21/21      PT LONG TERM GOAL #4   Title Patient will demonstrate Rt knee AROM 0-110 degrees to facilitate ability to perform transfers, sitting, ambulation, stair navigation s restriction due to mobility.    Time 10    Period Weeks    Status New    Target Date 06/21/21      PT LONG TERM GOAL #5   Title Patient will demonstrate Rt LE MMT 5/5 throughout to facilitate ability to perform usual standing, walking, stairs at PLOF s limitation due to symptoms.    Time 10    Period Weeks    Status New    Target Date 06/21/21      Additional Long Term Goals   Additional Long Term Goals Yes      PT LONG TERM GOAL #6   Title Pt. will demonstrate FOTO outcome > or = 60% to indicated reduced disability due to condition.    Time 10    Period Weeks    Status New    Target Date 06/21/21      PT LONG TERM GOAL #7   Title Pt. will demonstrate reciprocal gait pattern s UE assist for household and football stadium navigation.    Time 10    Period Weeks    Status New    Target Date 06/21/21                   Plan - 04/23/21 1538     Clinical Impression Statement Progressed strengthening program to machine resistance c good response and performance.  Continued skilled PT services for improvement of end range mobility, strength and movement coordination warranted at this time.    Personal Factors and Comorbidities Comorbidity 2    Comorbidities HTN, MS    Examination-Activity Limitations Sit;Sleep;Squat;Bend;Stairs;Carry;Stand;Transfers;Lift;Locomotion Level    Examination-Participation Restrictions Community Activity;Cleaning;Shop;Driving;Interpersonal Relationship;[employer]  Work;Laundry;Meal Prep;Occupation    Stability/Clinical Decision Making Stable/Uncomplicated    Rehab Potential Good    PT Frequency 2x /  week    PT Duration Other (comment)   10 weeks   PT Treatment/Interventions ADLs/Self Care Home Management;Cryotherapy;Electrical Stimulation;Iontophoresis '4mg'$ /ml Dexamethasone;Moist Heat;Balance training;Therapeutic exercise;Therapeutic activities;Functional mobility training;Stair training;Gait training;Ultrasound;Neuromuscular re-education;Patient/family education;Passive range of motion;Dry needling;Vasopneumatic Device;Manual techniques    PT Next Visit Plan Static balance non compliant and compliant surfaces, continued progressive reisstance training    PT Home Exercise Plan [id number]    Consulted and Agree with Plan of Care Patient             Patient will benefit from skilled therapeutic intervention in order to improve the following deficits and impairments:  Abnormal gait, Hypomobility, Increased edema, Decreased activity tolerance, Decreased strength, Increased fascial restricitons, Pain, Difficulty walking, Decreased mobility, Decreased balance, Decreased range of motion, Impaired perceived functional ability, Improper body mechanics, Impaired flexibility, Decreased coordination  Visit Diagnosis: Chronic pain of right knee  Muscle weakness (generalized)  Stiffness of right knee, not elsewhere classified  Difficulty in walking, not elsewhere classified  Localized edema     Problem List Patient Active Problem List   Diagnosis Date Noted   Status post total right knee replacement 03/19/2021   Primary osteoarthritis of left knee 11/29/2020   Primary osteoarthritis of right knee 06/23/2020   Vitamin D deficiency 01/29/2016   Optic neuritis 10/24/2014   Other fatigue 10/24/2014   High risk medication use 10/19/2014   Contraception management 08/05/2011   Multiple sclerosis ([place]) 08/26/1997    Scot Jun, PT, DPT, OCS, ATC 04/23/21  4:03 PM    [facility] [address] [place], [place], [address] Phone: [phone]   Fax:   [phone]  Name: Chanequa Kasprzyk MRN: [id number] Date of Birth: 12/02/1960

## 2021-04-24 ENCOUNTER — Other Ambulatory Visit (HOSPITAL_COMMUNITY): Payer: Self-pay

## 2021-04-25 ENCOUNTER — Other Ambulatory Visit (HOSPITAL_COMMUNITY): Payer: Self-pay

## 2021-04-26 ENCOUNTER — Ambulatory Visit (INDEPENDENT_AMBULATORY_CARE_PROVIDER_SITE_OTHER): Payer: 59 | Admitting: Rehabilitative and Restorative Service Providers"

## 2021-04-26 ENCOUNTER — Encounter: Payer: Self-pay | Admitting: Rehabilitative and Restorative Service Providers"

## 2021-04-26 ENCOUNTER — Other Ambulatory Visit: Payer: Self-pay

## 2021-04-26 DIAGNOSIS — M6281 Muscle weakness (generalized): Secondary | ICD-10-CM | POA: Diagnosis not present

## 2021-04-26 DIAGNOSIS — G8929 Other chronic pain: Secondary | ICD-10-CM | POA: Diagnosis not present

## 2021-04-26 DIAGNOSIS — M25561 Pain in right knee: Secondary | ICD-10-CM | POA: Diagnosis not present

## 2021-04-26 DIAGNOSIS — R600 Localized edema: Secondary | ICD-10-CM

## 2021-04-26 DIAGNOSIS — R262 Difficulty in walking, not elsewhere classified: Secondary | ICD-10-CM

## 2021-04-26 DIAGNOSIS — M25661 Stiffness of right knee, not elsewhere classified: Secondary | ICD-10-CM | POA: Diagnosis not present

## 2021-04-26 NOTE — Therapy (Signed)
[facility] [address] [place], [place], [address] Phone: [phone]   Fax:  [phone]  Physical Therapy Treatment  Patient Details  Name: Jill Hall MRN: [id number] Date of Birth: 1961/06/15 Referring Provider (PT): Dr. Frankey Shown   Encounter Date: 04/26/2021   PT End of Session - 04/26/21 1253     Visit Number 5    Number of Visits 20    Date for PT Re-Evaluation 06/21/21    Authorization Type [facility], BCBS    Progress Note Due on Visit 10    PT Start Time 1253    PT Stop Time 1343    PT Time Calculation (min) 50 min    Activity Tolerance Patient tolerated treatment well    Behavior During Therapy [facility] for tasks assessed/performed             Past Medical History:  Diagnosis Date   Arthritis    both knees   Hypertension    Multiple sclerosis ([place])    Neuromuscular disorder ([place])    Multiple Sclerosis- affects her vision only    Past Surgical History:  Procedure Laterality Date   CESAREAN SECTION     TOTAL KNEE ARTHROPLASTY Right 03/19/2021   Procedure: RIGHT TOTAL KNEE ARTHROPLASTY;  Surgeon: Leandrew Koyanagi, MD;  Location: [place];  Service: Orthopedics;  Laterality: Right;   UTERINE FIBROID SURGERY      There were no vitals filed for this visit.   Subjective Assessment - 04/26/21 1256     Subjective Pt. stated that same place is noted.  No specific worse or better from medial knee.    Limitations Standing;Walking;House hold activities;Lifting;Sitting    Patient Stated Goals Reduce pain, walk independently    Currently in Pain? Yes    Pain Score 4     Pain Location Knee    Pain Orientation Right    Pain Descriptors / Indicators Tightness;Sore    Pain Type Chronic pain;Surgical pain    Pain Onset More than a month ago    Pain Frequency Intermittent    Aggravating Factors  nothing specific reported    Pain Relieving Factors some movement helps tightness                                [facility] Adult PT Treatment/Exercise - 04/26/21 0001       Neuro Re-ed    Neuro Re-ed Details  tandem stance 1 min x 2 bilateral, retro step 20x Rt LE posterior      Knee/Hip Exercises: Stretches   Press photographer 60 seconds;2 reps;Right   incline board runner stretch     Knee/Hip Exercises: Aerobic   Recumbent Bike Fwd lvl 1 6 mins      Knee/Hip Exercises: Machines for Strengthening   Cybex Knee Extension eccentric lowering Rt leg 10 lbs 2 x 10    Cybex Leg Press 50 lbs double leg 2 x 15, Rt 2 x 10 31 lbs      Knee/Hip Exercises: Standing   Forward Step Up Step Height: 4";2 sets;10 reps;Right;Hand Hold: 1      Vasopneumatic   Number Minutes Vasopneumatic  10 minutes    Vasopnuematic Location  Knee    Vasopneumatic Pressure Medium    Vasopneumatic Temperature  34                      PT Short Term Goals - 04/17/21  0852       PT SHORT TERM GOAL #1   Title Patient will demonstrate independent use of home exercise program to maintain progress from in clinic treatments.    Time 3    Period Weeks    Status On-going    Target Date 05/03/21               PT Long Term Goals - 04/12/21 0758       PT LONG TERM GOAL #1   Title Patient will demonstrate/report pain at worst less than or equal to 2/10 to facilitate minimal limitation in daily activity secondary to pain symptoms.    Time 10    Period Weeks    Status New    Target Date 06/21/21      PT LONG TERM GOAL #2   Title Patient will demonstrate independent use of home exercise program to facilitate ability to maintain/progress functional gains from skilled physical therapy services.    Time 10    Period Weeks    Status New    Target Date 06/21/21      PT LONG TERM GOAL #3   Title Patient will demonstrate independent ambulation community distances > 300 ft to facilitate community integration at [facility].    Time 10    Period Weeks    Status New    Target Date 06/21/21      PT LONG TERM GOAL #4   Title  Patient will demonstrate Rt knee AROM 0-110 degrees to facilitate ability to perform transfers, sitting, ambulation, stair navigation s restriction due to mobility.    Time 10    Period Weeks    Status New    Target Date 06/21/21      PT LONG TERM GOAL #5   Title Patient will demonstrate Rt LE MMT 5/5 throughout to facilitate ability to perform usual standing, walking, stairs at PLOF s limitation due to symptoms.    Time 10    Period Weeks    Status New    Target Date 06/21/21      Additional Long Term Goals   Additional Long Term Goals Yes      PT LONG TERM GOAL #6   Title Pt. will demonstrate FOTO outcome > or = 60% to indicated reduced disability due to condition.    Time 10    Period Weeks    Status New    Target Date 06/21/21      PT LONG TERM GOAL #7   Title Pt. will demonstrate reciprocal gait pattern s UE assist for household and football stadium navigation.    Time 10    Period Weeks    Status New    Target Date 06/21/21                   Plan - 04/26/21 1315     Clinical Impression Statement Continued plan for increased strengthening and control as well as added WB loading (stairs) for improved progressive mobility.  Fair to good tolerance overall noted c mild fatigue.    Personal Factors and Comorbidities Comorbidity 2    Comorbidities HTN, MS    Examination-Activity Limitations Sit;Sleep;Squat;Bend;Stairs;Carry;Stand;Transfers;Lift;Locomotion Level    Examination-Participation Restrictions Community Activity;Cleaning;Shop;Driving;Interpersonal Relationship;Yard Work;Laundry;Meal Prep;Occupation    Stability/Clinical Decision Making Stable/Uncomplicated    Rehab Potential Good    PT Frequency 2x / week    PT Duration Other (comment)   10 weeks   PT Treatment/Interventions ADLs/Self Care Home Management;Cryotherapy;Electrical Stimulation;Iontophoresis '4mg'$ /ml Dexamethasone;Moist Heat;Balance  training;Therapeutic exercise;Therapeutic activities;Functional  mobility training;Stair training;Gait training;Ultrasound;Neuromuscular re-education;Patient/family education;Passive range of motion;Dry needling;Vasopneumatic Device;Manual techniques    PT Next Visit Plan Continue Static balance non compliant and compliant surfaces, continued progressive reisstance training    PT Home Exercise Plan [id number]    Consulted and Agree with Plan of Care Patient             Patient will benefit from skilled therapeutic intervention in order to improve the following deficits and impairments:  Abnormal gait, Hypomobility, Increased edema, Decreased activity tolerance, Decreased strength, Increased fascial restricitons, Pain, Difficulty walking, Decreased mobility, Decreased balance, Decreased range of motion, Impaired perceived functional ability, Improper body mechanics, Impaired flexibility, Decreased coordination  Visit Diagnosis: Chronic pain of right knee  Muscle weakness (generalized)  Stiffness of right knee, not elsewhere classified  Difficulty in walking, not elsewhere classified  Localized edema     Problem List Patient Active Problem List   Diagnosis Date Noted   Status post total right knee replacement 03/19/2021   Primary osteoarthritis of left knee 11/29/2020   Primary osteoarthritis of right knee 06/23/2020   Vitamin D deficiency 01/29/2016   Optic neuritis 10/24/2014   Other fatigue 10/24/2014   High risk medication use 10/19/2014   Contraception management 08/05/2011   Multiple sclerosis ([place]) 08/26/1997   Scot Jun, PT, DPT, OCS, ATC 04/26/21  1:31 PM    [facility] [address] [place], [place], [address] Phone: [phone]   Fax:  [phone]  Name: Jill Hall MRN: [id number] Date of Birth: 20-Feb-1961

## 2021-05-01 ENCOUNTER — Encounter: Payer: Self-pay | Admitting: Orthopaedic Surgery

## 2021-05-01 ENCOUNTER — Ambulatory Visit (INDEPENDENT_AMBULATORY_CARE_PROVIDER_SITE_OTHER): Payer: 59 | Admitting: Orthopaedic Surgery

## 2021-05-01 ENCOUNTER — Ambulatory Visit (INDEPENDENT_AMBULATORY_CARE_PROVIDER_SITE_OTHER): Payer: 59 | Admitting: Physical Therapy

## 2021-05-01 ENCOUNTER — Other Ambulatory Visit: Payer: Self-pay

## 2021-05-01 ENCOUNTER — Encounter: Payer: Self-pay | Admitting: Physical Therapy

## 2021-05-01 ENCOUNTER — Ambulatory Visit (INDEPENDENT_AMBULATORY_CARE_PROVIDER_SITE_OTHER): Payer: 59

## 2021-05-01 VITALS — Ht 67.0 in | Wt 227.0 lb

## 2021-05-01 DIAGNOSIS — R600 Localized edema: Secondary | ICD-10-CM

## 2021-05-01 DIAGNOSIS — M6281 Muscle weakness (generalized): Secondary | ICD-10-CM

## 2021-05-01 DIAGNOSIS — R262 Difficulty in walking, not elsewhere classified: Secondary | ICD-10-CM

## 2021-05-01 DIAGNOSIS — M25561 Pain in right knee: Secondary | ICD-10-CM

## 2021-05-01 DIAGNOSIS — G8929 Other chronic pain: Secondary | ICD-10-CM | POA: Diagnosis not present

## 2021-05-01 DIAGNOSIS — Z96651 Presence of right artificial knee joint: Secondary | ICD-10-CM | POA: Diagnosis not present

## 2021-05-01 DIAGNOSIS — M25661 Stiffness of right knee, not elsewhere classified: Secondary | ICD-10-CM | POA: Diagnosis not present

## 2021-05-01 NOTE — Progress Notes (Signed)
   Post-Op Visit Note   Patient: Jill Hall           Date of Birth: [birth year]           MRN: [id number] Visit Date: 05/01/2021 PCP: Wenda Low, MD   Assessment & Plan:  Chief Complaint:  Chief Complaint  Patient presents with   Right Knee - Follow-up    Right TKA 03/19/2021   Visit Diagnoses:  1. Status post total right knee replacement     Plan: Patient is a pleasant 60 year old female who comes in today 6 weeks status post right total knee replacement 03/19/2021.  She has been doing well.  She is still in physical therapy making good progress.  She is ambulating with a single-point cane.  Examination of the right knee reveals a fully healed surgical scar without complication.  Range of motion 0 to 100 degrees.  Stable valgus varus stress.  Calf is soft nontender.  She is neurovascular intact distally.  At this point, she will continue with her physical therapy.  She may discontinue her compression socks.  Dental prophylaxis reinforced.  Follow-up with [place] in 6 weeks for repeat evaluation.  Call with concerns or questions.  Follow-Up Instructions: Return in about 6 weeks (around 06/12/2021).   Orders:  Orders Placed This Encounter  Procedures   XR Knee 1-2 Views Right   No orders of the defined types were placed in this encounter.   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   Primary osteoarthritis of left knee 11/29/2020   Primary osteoarthritis of right knee 06/23/2020   Vitamin D deficiency 01/29/2016   Optic neuritis 10/24/2014   Other fatigue 10/24/2014   High risk medication use 10/19/2014   Contraception management 08/05/2011   Multiple sclerosis ([place]) 08/26/1997   Past Medical History:  Diagnosis Date   Arthritis    both knees   Hypertension    Multiple sclerosis ([place])    Neuromuscular disorder ([place])    Multiple Sclerosis- affects her vision  only    Family History  Problem Relation Age of Onset   Breast cancer Mother 44    Past Surgical History:  Procedure Laterality Date   CESAREAN SECTION     TOTAL KNEE ARTHROPLASTY Right 03/19/2021   Procedure: RIGHT TOTAL KNEE ARTHROPLASTY;  Surgeon: Leandrew Koyanagi, MD;  Location: [facility];  Service: Orthopedics;  Laterality: Right;   UTERINE FIBROID SURGERY     Social History   Occupational History   Not on file  Tobacco Use   Smoking status: Never   Smokeless tobacco: Never  Vaping Use   Vaping Use: Never used  Substance and Sexual Activity   Alcohol use: No   Drug use: No   Sexual activity: Not on file

## 2021-05-01 NOTE — Therapy (Signed)
[facility] [address] [place], [place], [address] Phone: [phone]   Fax:  [phone]  Physical Therapy Treatment  Patient Details  Name: Jill Hall MRN: [id number] Date of Birth: Jan 16, 1961 Referring Provider (PT): Dr. Frankey Shown   Encounter Date: 05/01/2021   PT End of Session - 05/01/21 0936     Visit Number 6    Number of Visits 20    Date for PT Re-Evaluation 06/21/21    Authorization Type [facility] [employer], BCBS    Progress Note Due on Visit 10    PT Start Time 0930    PT Stop Time 1025    PT Time Calculation (min) 55 min    Activity Tolerance Patient tolerated treatment well    Behavior During Therapy [facility] for tasks assessed/performed             Past Medical History:  Diagnosis Date   Arthritis    both knees   Hypertension    Multiple sclerosis ([place])    Neuromuscular disorder ([place])    Multiple Sclerosis- affects her vision only    Past Surgical History:  Procedure Laterality Date   CESAREAN SECTION     TOTAL KNEE ARTHROPLASTY Right 03/19/2021   Procedure: RIGHT TOTAL KNEE ARTHROPLASTY;  Surgeon: Leandrew Koyanagi, MD;  Location: [facility];  Service: Orthopedics;  Laterality: Right;   UTERINE FIBROID SURGERY      There were no vitals filed for this visit.   Subjective Assessment - 05/01/21 0930     Subjective She saw Dr. Erlinda Hong this morning. He is pleased with progress thus far.    Limitations Standing;Walking;House hold activities;Lifting;Sitting    Patient Stated Goals Reduce pain, walk independently    Currently in Pain? No/denies   no pain but stiffness especially if stays in one position too long   Pain Onset More than a month ago                [facility] PT Assessment - 05/01/21 0001       AROM   Right Knee Extension -6   standing   Right Knee Flexion 104   supine heel slide                          [facility] Adult PT Treatment/Exercise - 05/01/21 0930       Neuro Re-ed    Neuro Re-ed Details   tandem stance 1 min x 2 bilateral, retro step 20x Rt LE posterior      Knee/Hip Exercises: Stretches   Press photographer 60 seconds;2 reps;Right   incline board runner stretch     Knee/Hip Exercises: Aerobic   Recumbent Bike Fwd lvl 1 6 mins seat 8      Knee/Hip Exercises: Machines for Strengthening   Cybex Knee Extension eccentric lowering Rt leg 10 lbs 2 x 10    Cybex Leg Press 75 lbs double leg 2sets x 15reps, Rt 2sets x 15 reps 37 lbs      Knee/Hip Exercises: Standing   Forward Step Up 10 reps;Right;Hand Hold: 1;Step Height: 6";1 set    Forward Step Up Limitations demo & verbal cues on technique    Step Down Right;1 set;10 reps;Hand Hold: 1;Step Height: 6"    Step Down Limitations demo & verbal cues on technique      Vasopneumatic   Number Minutes Vasopneumatic  10 minutes    Vasopnuematic Location  Knee    Vasopneumatic Pressure Medium  Vasopneumatic Temperature  34                      PT Short Term Goals - 04/17/21 [id number]       PT SHORT TERM GOAL #1   Title Patient will demonstrate independent use of home exercise program to maintain progress from in clinic treatments.    Time 3    Period Weeks    Status On-going    Target Date 05/03/21               PT Long Term Goals - 04/12/21 0758       PT LONG TERM GOAL #1   Title Patient will demonstrate/report pain at worst less than or equal to 2/10 to facilitate minimal limitation in daily activity secondary to pain symptoms.    Time 10    Period Weeks    Status New    Target Date 06/21/21      PT LONG TERM GOAL #2   Title Patient will demonstrate independent use of home exercise program to facilitate ability to maintain/progress functional gains from skilled physical therapy services.    Time 10    Period Weeks    Status New    Target Date 06/21/21      PT LONG TERM GOAL #3   Title Patient will demonstrate independent ambulation community distances > 300 ft to facilitate community integration at  [facility].    Time 10    Period Weeks    Status New    Target Date 06/21/21      PT LONG TERM GOAL #4   Title Patient will demonstrate Rt knee AROM 0-110 degrees to facilitate ability to perform transfers, sitting, ambulation, stair navigation s restriction due to mobility.    Time 10    Period Weeks    Status New    Target Date 06/21/21      PT LONG TERM GOAL #5   Title Patient will demonstrate Rt LE MMT 5/5 throughout to facilitate ability to perform usual standing, walking, stairs at PLOF s limitation due to symptoms.    Time 10    Period Weeks    Status New    Target Date 06/21/21      Additional Long Term Goals   Additional Long Term Goals Yes      PT LONG TERM GOAL #6   Title Pt. will demonstrate FOTO outcome > or = 60% to indicated reduced disability due to condition.    Time 10    Period Weeks    Status New    Target Date 06/21/21      PT LONG TERM GOAL #7   Title Pt. will demonstrate reciprocal gait pattern s UE assist for household and football stadium navigation.    Time 10    Period Weeks    Status New    Target Date 06/21/21                   Plan - 05/01/21 0936     Clinical Impression Statement Patient improved her fluency with gait without device and less fatigue.  She improved step up & down with RLE.    Personal Factors and Comorbidities Comorbidity 2    Comorbidities HTN, MS    Examination-Activity Limitations Sit;Sleep;Squat;Bend;Stairs;Carry;Stand;Transfers;Lift;Locomotion Level    Examination-Participation Restrictions Community Activity;Cleaning;Shop;Driving;Interpersonal Relationship;Yard Work;Laundry;Meal Prep;Occupation    Stability/Clinical Decision Making Stable/Uncomplicated    Rehab Potential Good    PT Frequency 2x / week  PT Duration Other (comment)   10 weeks   PT Treatment/Interventions ADLs/Self Care Home Management;Cryotherapy;Electrical Stimulation;Iontophoresis '4mg'$ /ml Dexamethasone;Moist Heat;Balance training;Therapeutic  exercise;Therapeutic activities;Functional mobility training;Stair training;Gait training;Ultrasound;Neuromuscular re-education;Patient/family education;Passive range of motion;Dry needling;Vasopneumatic Device;Manual techniques    PT Next Visit Plan check STG & ROM Continue Static balance non compliant and compliant surfaces, continued progressive reisstance training    PT Home Exercise Plan [id number]    Consulted and Agree with Plan of Care Patient             Patient will benefit from skilled therapeutic intervention in order to improve the following deficits and impairments:  Abnormal gait, Hypomobility, Increased edema, Decreased activity tolerance, Decreased strength, Increased fascial restricitons, Pain, Difficulty walking, Decreased mobility, Decreased balance, Decreased range of motion, Impaired perceived functional ability, Improper body mechanics, Impaired flexibility, Decreased coordination  Visit Diagnosis: Muscle weakness (generalized)  Chronic pain of right knee  Stiffness of right knee, not elsewhere classified  Difficulty in walking, not elsewhere classified  Localized edema     Problem List Patient Active Problem List   Diagnosis Date Noted   Status post total right knee replacement 03/19/2021   Primary osteoarthritis of left knee 11/29/2020   Primary osteoarthritis of right knee 06/23/2020   Vitamin D deficiency 01/29/2016   Optic neuritis 10/24/2014   Other fatigue 10/24/2014   High risk medication use 10/19/2014   Contraception management 08/05/2011   Multiple sclerosis ([place]) 08/26/1997    Jamey Reas, PT,  DPT 05/01/2021, 11:11 AM  [facility] [address] [place], [place], [address] Phone: [phone]   Fax:  [phone]  Name: Almyra Koral MRN: [id number] Date of Birth: 01/18/1961

## 2021-05-03 ENCOUNTER — Ambulatory Visit (INDEPENDENT_AMBULATORY_CARE_PROVIDER_SITE_OTHER): Payer: 59 | Admitting: Rehabilitative and Restorative Service Providers"

## 2021-05-03 ENCOUNTER — Other Ambulatory Visit: Payer: Self-pay

## 2021-05-03 ENCOUNTER — Encounter: Payer: Self-pay | Admitting: Rehabilitative and Restorative Service Providers"

## 2021-05-03 DIAGNOSIS — M25661 Stiffness of right knee, not elsewhere classified: Secondary | ICD-10-CM

## 2021-05-03 DIAGNOSIS — R262 Difficulty in walking, not elsewhere classified: Secondary | ICD-10-CM

## 2021-05-03 DIAGNOSIS — G8929 Other chronic pain: Secondary | ICD-10-CM | POA: Diagnosis not present

## 2021-05-03 DIAGNOSIS — R600 Localized edema: Secondary | ICD-10-CM

## 2021-05-03 DIAGNOSIS — M25561 Pain in right knee: Secondary | ICD-10-CM

## 2021-05-03 DIAGNOSIS — M6281 Muscle weakness (generalized): Secondary | ICD-10-CM | POA: Diagnosis not present

## 2021-05-03 NOTE — Therapy (Signed)
[facility] [address] [place], [place], [address] Phone: [phone]   Fax:  [phone]  Physical Therapy Treatment  Patient Details  Name: Jill Hall MRN: [id number] Date of Birth: [birth year] Referring Provider (PT): Dr. Frankey Shown   Encounter Date: 05/03/2021   PT End of Session - 05/03/21 0842     Visit Number 7    Number of Visits 20    Date for PT Re-Evaluation 06/21/21    Authorization Type [facility], BCBS    Progress Note Due on Visit 10    PT Start Time [phone]    PT Stop Time 0926    PT Time Calculation (min) 43 min    Activity Tolerance Patient tolerated treatment well    Behavior During Therapy [facility] for tasks assessed/performed             Past Medical History:  Diagnosis Date   Arthritis    both knees   Hypertension    Multiple sclerosis ([place])    Neuromuscular disorder ([place])    Multiple Sclerosis- affects her vision only    Past Surgical History:  Procedure Laterality Date   CESAREAN SECTION     TOTAL KNEE ARTHROPLASTY Right 03/19/2021   Procedure: RIGHT TOTAL KNEE ARTHROPLASTY;  Surgeon: Leandrew Koyanagi, MD;  Location: [facility];  Service: Orthopedics;  Laterality: Right;   UTERINE FIBROID SURGERY      There were no vitals filed for this visit.   Subjective Assessment - 05/03/21 0846     Subjective Pt. reported walking without cane more. No pain at rest upon arrival.  Does feel some burning on knee in bed with sheets.    Limitations Standing;Walking;House hold activities;Lifting;Sitting    Patient Stated Goals Reduce pain, walk independently    Currently in Pain? No/denies    Pain Score 0-No pain    Pain Onset More than a month ago                               [facility] Adult PT Treatment/Exercise - 05/03/21 0001       Neuro Re-ed    Neuro Re-ed Details  tandem ambulation fwd on foam in bars 8 ft x 12, tandem stance on foam 90 sec bilateral      Knee/Hip Exercises: Stretches   Development worker, community 60 seconds;3 reps;Right   incline board runner stretch     Knee/Hip Exercises: Aerobic   Recumbent Bike Lvl 2 6 mins seat 8      Knee/Hip Exercises: Machines for Strengthening   Cybex Knee Extension eccentric lowering Rt leg 10 lbs 3 x 10    Cybex Leg Press 75 lbs 2 x 15, Rt 2 x 15 37 lbs      Knee/Hip Exercises: Standing   Forward Step Up Step Height: 6";2 sets;10 reps;Right;Hand Hold: 1                       PT Short Term Goals - 05/03/21 0847       PT SHORT TERM GOAL #1   Title Patient will demonstrate independent use of home exercise program to maintain progress from in clinic treatments.    Time 3    Period Weeks    Status Achieved    Target Date 05/03/21               PT Long Term Goals - 05/03/21 [id number]  PT LONG TERM GOAL #1   Title Patient will demonstrate/report pain at worst less than or equal to 2/10 to facilitate minimal limitation in daily activity secondary to pain symptoms.    Time 10    Period Weeks    Status On-going    Target Date 06/21/21      PT LONG TERM GOAL #2   Title Patient will demonstrate independent use of home exercise program to facilitate ability to maintain/progress functional gains from skilled physical therapy services.    Time 10    Period Weeks    Status On-going    Target Date 06/21/21      PT LONG TERM GOAL #3   Title Patient will demonstrate independent ambulation community distances > 300 ft to facilitate community integration at [facility].    Time 10    Period Weeks    Status Achieved      PT LONG TERM GOAL #4   Title Patient will demonstrate Rt knee AROM 0-110 degrees to facilitate ability to perform transfers, sitting, ambulation, stair navigation s restriction due to mobility.    Time 10    Period Weeks    Status On-going    Target Date 06/21/21      PT LONG TERM GOAL #5   Title Patient will demonstrate Rt LE MMT 5/5 throughout to facilitate ability to perform usual standing, walking, stairs  at PLOF s limitation due to symptoms.    Time 10    Period Weeks    Status On-going    Target Date 06/21/21      PT LONG TERM GOAL #6   Title Pt. will demonstrate FOTO outcome > or = 60% to indicated reduced disability due to condition.    Time 10    Period Weeks    Status On-going    Target Date 06/21/21      PT LONG TERM GOAL #7   Title Pt. will demonstrate reciprocal gait pattern s UE assist for household and football stadium navigation.    Time 10    Period Weeks    Status On-going    Target Date 06/21/21                   Plan - 05/03/21 0906     Clinical Impression Statement Ambulation independent continued at this time.  Progressing steadily on step up activity loading, Step down control on Rt LE not quite ready for reciprocal gait s rail assist.  Gave cues for cross friction massage to incision in review today.    Personal Factors and Comorbidities Comorbidity 2    Comorbidities HTN, MS    Examination-Activity Limitations Sit;Sleep;Squat;Bend;Stairs;Carry;Stand;Transfers;Lift;Locomotion Level    Examination-Participation Restrictions Community Activity;Cleaning;Shop;Driving;Interpersonal Relationship;Yard Work;Laundry;Meal Prep;Occupation    Stability/Clinical Decision Making Stable/Uncomplicated    Rehab Potential Good    PT Frequency 2x / week    PT Duration Other (comment)   10 weeks   PT Treatment/Interventions ADLs/Self Care Home Management;Cryotherapy;Electrical Stimulation;Iontophoresis '4mg'$ /ml Dexamethasone;Moist Heat;Balance training;Therapeutic exercise;Therapeutic activities;Functional mobility training;Stair training;Gait training;Ultrasound;Neuromuscular re-education;Patient/family education;Passive range of motion;Dry needling;Vasopneumatic Device;Manual techniques    PT Next Visit Plan Eccentric step down improvements laterally, compliant surface balance, quad strengthening.    PT Home Exercise Plan [id number]    Consulted and Agree with Plan of Care  Patient             Patient will benefit from skilled therapeutic intervention in order to improve the following deficits and impairments:  Abnormal gait, Hypomobility, Increased edema, Decreased activity  tolerance, Decreased strength, Increased fascial restricitons, Pain, Difficulty walking, Decreased mobility, Decreased balance, Decreased range of motion, Impaired perceived functional ability, Improper body mechanics, Impaired flexibility, Decreased coordination  Visit Diagnosis: Chronic pain of right knee  Muscle weakness (generalized)  Stiffness of right knee, not elsewhere classified  Difficulty in walking, not elsewhere classified  Localized edema     Problem List Patient Active Problem List   Diagnosis Date Noted   Status post total right knee replacement 03/19/2021   Primary osteoarthritis of left knee 11/29/2020   Primary osteoarthritis of right knee 06/23/2020   Vitamin D deficiency 01/29/2016   Optic neuritis 10/24/2014   Other fatigue 10/24/2014   High risk medication use 10/19/2014   Contraception management 08/05/2011   Multiple sclerosis ([place]) 08/26/1997    Scot Jun, PT, DPT, OCS, ATC 05/03/21  9:25 AM    [facility] [address] [place], [place], [address] Phone: [phone]   Fax:  [phone]  Name: Jill Hall MRN: [id number] Date of Birth: 1961-08-20

## 2021-05-07 ENCOUNTER — Ambulatory Visit (INDEPENDENT_AMBULATORY_CARE_PROVIDER_SITE_OTHER): Payer: 59 | Admitting: Rehabilitative and Restorative Service Providers"

## 2021-05-07 ENCOUNTER — Other Ambulatory Visit: Payer: Self-pay

## 2021-05-07 ENCOUNTER — Encounter: Payer: Self-pay | Admitting: Rehabilitative and Restorative Service Providers"

## 2021-05-07 DIAGNOSIS — R262 Difficulty in walking, not elsewhere classified: Secondary | ICD-10-CM | POA: Diagnosis not present

## 2021-05-07 DIAGNOSIS — M6281 Muscle weakness (generalized): Secondary | ICD-10-CM | POA: Diagnosis not present

## 2021-05-07 DIAGNOSIS — G8929 Other chronic pain: Secondary | ICD-10-CM | POA: Diagnosis not present

## 2021-05-07 DIAGNOSIS — M25661 Stiffness of right knee, not elsewhere classified: Secondary | ICD-10-CM

## 2021-05-07 DIAGNOSIS — R600 Localized edema: Secondary | ICD-10-CM

## 2021-05-07 DIAGNOSIS — M25561 Pain in right knee: Secondary | ICD-10-CM

## 2021-05-07 NOTE — Therapy (Signed)
[facility] [address] [place], [place], [address] Phone: [phone]   Fax:  [phone]  Physical Therapy Treatment  Patient Details  Name: Jill Hall MRN: [id number] Date of Birth: 1960/10/16 Referring Provider (PT): Dr. Frankey Shown   Encounter Date: 05/07/2021   PT End of Session - 05/07/21 1003     Visit Number 8    Number of Visits 20    Date for PT Re-Evaluation 06/21/21    Authorization Type [facility], BCBS    Progress Note Due on Visit 10    PT Start Time 0927    PT Stop Time 1008    PT Time Calculation (min) 41 min    Activity Tolerance Patient tolerated treatment well    Behavior During Therapy [facility] for tasks assessed/performed             Past Medical History:  Diagnosis Date   Arthritis    both knees   Hypertension    Multiple sclerosis ([place])    Neuromuscular disorder ([place])    Multiple Sclerosis- affects her vision only    Past Surgical History:  Procedure Laterality Date   CESAREAN SECTION     TOTAL KNEE ARTHROPLASTY Right 03/19/2021   Procedure: RIGHT TOTAL KNEE ARTHROPLASTY;  Surgeon: Leandrew Koyanagi, MD;  Location: [place];  Service: Orthopedics;  Laterality: Right;   UTERINE FIBROID SURGERY      There were no vitals filed for this visit.   Subjective Assessment - 05/07/21 0929     Subjective Pt. reported no pain today.  Pt. stated chief complaint of stiffness and tightness that is in Rt knee.    Limitations Standing;Walking;House hold activities;Lifting;Sitting    Patient Stated Goals Reduce pain, walk independently    Currently in Pain? No/denies   no pain, stiffness   Pain Location Knee    Pain Orientation Right    Pain Descriptors / Indicators Tightness    Pain Type Chronic pain;Surgical pain    Pain Onset More than a month ago    Pain Frequency Intermittent                               [facility] Adult PT Treatment/Exercise - 05/07/21 0001       Neuro Re-ed    Neuro Re-ed  Details  lateral stepping 3 cones x 8 bilateral, SLS c cone touches contralteral leg (anterior, anterior/medial, anterior/lateral) x 8 each      Knee/Hip Exercises: Stretches   Gastroc Stretch 60 seconds;3 reps;Right   Runner stretch on incline board     Knee/Hip Exercises: Aerobic   Recumbent Bike Lvl 3 8 min      Knee/Hip Exercises: Machines for Strengthening   Cybex Knee Extension eccentric lowering Rt leg 10 lbs 3 x 10      Knee/Hip Exercises: Standing   Step Down Step Height: 6";10 reps;Right;3 sets   lateral step down     Knee/Hip Exercises: Seated   Other Seated Knee/Hip Exercises seated SLR Rt x 10 c cues for positioning                       PT Short Term Goals - 05/03/21 0847       PT SHORT TERM GOAL #1   Title Patient will demonstrate independent use of home exercise program to maintain progress from in clinic treatments.    Time 3    Period Weeks  Status Achieved    Target Date 05/03/21               PT Long Term Goals - 05/03/21 0847       PT LONG TERM GOAL #1   Title Patient will demonstrate/report pain at worst less than or equal to 2/10 to facilitate minimal limitation in daily activity secondary to pain symptoms.    Time 10    Period Weeks    Status On-going    Target Date 06/21/21      PT LONG TERM GOAL #2   Title Patient will demonstrate independent use of home exercise program to facilitate ability to maintain/progress functional gains from skilled physical therapy services.    Time 10    Period Weeks    Status On-going    Target Date 06/21/21      PT LONG TERM GOAL #3   Title Patient will demonstrate independent ambulation community distances > 300 ft to facilitate community integration at [facility].    Time 10    Period Weeks    Status Achieved      PT LONG TERM GOAL #4   Title Patient will demonstrate Rt knee AROM 0-110 degrees to facilitate ability to perform transfers, sitting, ambulation, stair navigation s restriction  due to mobility.    Time 10    Period Weeks    Status On-going    Target Date 06/21/21      PT LONG TERM GOAL #5   Title Patient will demonstrate Rt LE MMT 5/5 throughout to facilitate ability to perform usual standing, walking, stairs at PLOF s limitation due to symptoms.    Time 10    Period Weeks    Status On-going    Target Date 06/21/21      PT LONG TERM GOAL #6   Title Pt. will demonstrate FOTO outcome > or = 60% to indicated reduced disability due to condition.    Time 10    Period Weeks    Status On-going    Target Date 06/21/21      PT LONG TERM GOAL #7   Title Pt. will demonstrate reciprocal gait pattern s UE assist for household and football stadium navigation.    Time 10    Period Weeks    Status On-going    Target Date 06/21/21                   Plan - 05/07/21 1007     Clinical Impression Statement Pt. to benefit from continued quad strengthening and TKE gains to improve full extension in ambulation.  Pt. may also benefit from dynamic and compliant surface balance interventions.    Personal Factors and Comorbidities Comorbidity 2    Comorbidities HTN, MS    Examination-Activity Limitations Sit;Sleep;Squat;Bend;Stairs;Carry;Stand;Transfers;Lift;Locomotion Level    Examination-Participation Restrictions Community Activity;Cleaning;Shop;Driving;Interpersonal Relationship;Yard Work;Laundry;Meal Prep;Occupation    Stability/Clinical Decision Making Stable/Uncomplicated    Rehab Potential Good    PT Frequency 2x / week    PT Duration Other (comment)   10 weeks   PT Treatment/Interventions ADLs/Self Care Home Management;Cryotherapy;Electrical Stimulation;Iontophoresis '4mg'$ /ml Dexamethasone;Moist Heat;Balance training;Therapeutic exercise;Therapeutic activities;Functional mobility training;Stair training;Gait training;Ultrasound;Neuromuscular re-education;Patient/family education;Passive range of motion;Dry needling;Vasopneumatic Device;Manual techniques    PT  Next Visit Plan Eccentric step down improvements laterally, compliant surface balance/dynamic balance, TKE strengthening (seated SLR, resistance band    PT Home Exercise Plan [id number]    Consulted and Agree with Plan of Care Patient  Patient will benefit from skilled therapeutic intervention in order to improve the following deficits and impairments:  Abnormal gait, Hypomobility, Increased edema, Decreased activity tolerance, Decreased strength, Increased fascial restricitons, Pain, Difficulty walking, Decreased mobility, Decreased balance, Decreased range of motion, Impaired perceived functional ability, Improper body mechanics, Impaired flexibility, Decreased coordination  Visit Diagnosis: Chronic pain of right knee  Muscle weakness (generalized)  Stiffness of right knee, not elsewhere classified  Difficulty in walking, not elsewhere classified  Localized edema     Problem List Patient Active Problem List   Diagnosis Date Noted   Status post total right knee replacement 03/19/2021   Primary osteoarthritis of left knee 11/29/2020   Primary osteoarthritis of right knee 06/23/2020   Vitamin D deficiency 01/29/2016   Optic neuritis 10/24/2014   Other fatigue 10/24/2014   High risk medication use 10/19/2014   Contraception management 08/05/2011   Multiple sclerosis ([place]) 08/26/1997    Scot Jun, PT, DPT, OCS, ATC 05/07/21  10:10 AM    [facility] [address] [place], [place], [address] Phone: [phone]   Fax:  [phone]  Name: Valori Nur MRN: [id number] Date of Birth: 02/22/1961

## 2021-05-09 ENCOUNTER — Encounter: Payer: Self-pay | Admitting: Rehabilitative and Restorative Service Providers"

## 2021-05-09 ENCOUNTER — Other Ambulatory Visit: Payer: Self-pay

## 2021-05-09 ENCOUNTER — Ambulatory Visit (INDEPENDENT_AMBULATORY_CARE_PROVIDER_SITE_OTHER): Payer: 59 | Admitting: Rehabilitative and Restorative Service Providers"

## 2021-05-09 DIAGNOSIS — M6281 Muscle weakness (generalized): Secondary | ICD-10-CM | POA: Diagnosis not present

## 2021-05-09 DIAGNOSIS — G8929 Other chronic pain: Secondary | ICD-10-CM | POA: Diagnosis not present

## 2021-05-09 DIAGNOSIS — R262 Difficulty in walking, not elsewhere classified: Secondary | ICD-10-CM

## 2021-05-09 DIAGNOSIS — M25561 Pain in right knee: Secondary | ICD-10-CM | POA: Diagnosis not present

## 2021-05-09 DIAGNOSIS — M25661 Stiffness of right knee, not elsewhere classified: Secondary | ICD-10-CM

## 2021-05-09 DIAGNOSIS — R600 Localized edema: Secondary | ICD-10-CM | POA: Diagnosis not present

## 2021-05-09 NOTE — Therapy (Signed)
[facility] [address] [place], [place], [address] Phone: [phone]   Fax:  [phone]  Physical Therapy Treatment  Patient Details  Name: Jill Hall MRN: [id number] Date of Birth: 1961/04/06 Referring Provider (PT): Dr. Frankey Shown   Encounter Date: 05/09/2021   PT End of Session - 05/09/21 0920     Visit Number 9    Number of Visits 20    Date for PT Re-Evaluation 06/21/21    Authorization Type [facility], BCBS    Progress Note Due on Visit 10    PT Start Time 0921    PT Stop Time 1002    PT Time Calculation (min) 41 min    Activity Tolerance Patient tolerated treatment well    Behavior During Therapy [facility] for tasks assessed/performed             Past Medical History:  Diagnosis Date   Arthritis    both knees   Hypertension    Multiple sclerosis ([place])    Neuromuscular disorder ([place])    Multiple Sclerosis- affects her vision only    Past Surgical History:  Procedure Laterality Date   CESAREAN SECTION     TOTAL KNEE ARTHROPLASTY Right 03/19/2021   Procedure: RIGHT TOTAL KNEE ARTHROPLASTY;  Surgeon: Leandrew Koyanagi, MD;  Location: [place];  Service: Orthopedics;  Laterality: Right;   UTERINE FIBROID SURGERY      There were no vitals filed for this visit.   Subjective Assessment - 05/09/21 0923     Subjective Pt. indicated no pain, reported stiffness still noted.  Pt. stated she hasn't been up stairs in house yet.    Limitations Standing;Walking;House hold activities;Lifting;Sitting    Patient Stated Goals Reduce pain, walk independently    Currently in Pain? No/denies    Pain Score 0-No pain    Pain Onset More than a month ago                [facility] PT Assessment - 05/09/21 0001       Assessment   Medical Diagnosis OA Rt knee, s/p Rt TKA    Referring Provider (PT) Dr. Frankey Shown    Onset Date/Surgical Date 03/19/21      Observation/Other Assessments   Focus on Therapeutic Outcomes (FOTO)  updated 65%       Single Leg Stance   Comments on foam, 10-15 seconds c each LE unassisted      Ambulation/Gait   Gait Comments Independent ambulation                           [facility] Adult PT Treatment/Exercise - 05/09/21 0001       Neuro Re-ed    Neuro Re-ed Details  SLS on foam 30 sec x 4 bilateral c occasional hand assist on bar      Knee/Hip Exercises: Aerobic   Recumbent Bike Lvl 3 10 mins seat 7      Knee/Hip Exercises: Machines for Strengthening   Cybex Knee Extension eccentric Rt leg lowering 15 lbs 3 x 10    Cybex Leg Press 87 lbs DL 2 x 15, 2 x 15 SL 50 lbs      Knee/Hip Exercises: Standing   Step Down Step Height: 6";Hand Hold: 1   eccentric lowering focus to side, TKE green band in stance on step 3 x 10     Knee/Hip Exercises: Seated   Other Seated Knee/Hip Exercises seated SLR Rt 2 x  10                       PT Short Term Goals - 05/03/21 0847       PT SHORT TERM GOAL #1   Title Patient will demonstrate independent use of home exercise program to maintain progress from in clinic treatments.    Time 3    Period Weeks    Status Achieved    Target Date 05/03/21               PT Long Term Goals - 05/03/21 0847       PT LONG TERM GOAL #1   Title Patient will demonstrate/report pain at worst less than or equal to 2/10 to facilitate minimal limitation in daily activity secondary to pain symptoms.    Time 10    Period Weeks    Status On-going    Target Date 06/21/21      PT LONG TERM GOAL #2   Title Patient will demonstrate independent use of home exercise program to facilitate ability to maintain/progress functional gains from skilled physical therapy services.    Time 10    Period Weeks    Status On-going    Target Date 06/21/21      PT LONG TERM GOAL #3   Title Patient will demonstrate independent ambulation community distances > 300 ft to facilitate community integration at [facility].    Time 10    Period Weeks    Status Achieved       PT LONG TERM GOAL #4   Title Patient will demonstrate Rt knee AROM 0-110 degrees to facilitate ability to perform transfers, sitting, ambulation, stair navigation s restriction due to mobility.    Time 10    Period Weeks    Status On-going    Target Date 06/21/21      PT LONG TERM GOAL #5   Title Patient will demonstrate Rt LE MMT 5/5 throughout to facilitate ability to perform usual standing, walking, stairs at PLOF s limitation due to symptoms.    Time 10    Period Weeks    Status On-going    Target Date 06/21/21      PT LONG TERM GOAL #6   Title Pt. will demonstrate FOTO outcome > or = 60% to indicated reduced disability due to condition.    Time 10    Period Weeks    Status On-going    Target Date 06/21/21      PT LONG TERM GOAL #7   Title Pt. will demonstrate reciprocal gait pattern s UE assist for household and football stadium navigation.    Time 10    Period Weeks    Status On-going    Target Date 06/21/21                   Plan - 05/09/21 0937     Clinical Impression Statement Improving control on stair navigation, specically forward.  Important for stair navigation to improve to allow full household navigation.  Continued skilled PT services indicated at this time.  Early discussion with patient about possible reduced frequency in future due to improvements.    Personal Factors and Comorbidities Comorbidity 2    Comorbidities HTN, MS    Examination-Activity Limitations Sit;Sleep;Squat;Bend;Stairs;Carry;Stand;Transfers;Lift;Locomotion Level    Examination-Participation Restrictions Community Activity;Cleaning;Shop;Driving;Interpersonal Relationship;Yard Work;Laundry;Meal Prep;Occupation    Stability/Clinical Decision Making Stable/Uncomplicated    Rehab Potential Good    PT Frequency 2x / week  PT Duration Other (comment)   10 weeks   PT Treatment/Interventions ADLs/Self Care Home Management;Cryotherapy;Electrical Stimulation;Iontophoresis '4mg'$ /ml  Dexamethasone;Moist Heat;Balance training;Therapeutic exercise;Therapeutic activities;Functional mobility training;Stair training;Gait training;Ultrasound;Neuromuscular re-education;Patient/family education;Passive range of motion;Dry needling;Vasopneumatic Device;Manual techniques    PT Next Visit Plan Stair improvements, static and dynamic balance.  Full PN c FOTO next viist.    PT Home Exercise Plan [id number]    Consulted and Agree with Plan of Care Patient             Patient will benefit from skilled therapeutic intervention in order to improve the following deficits and impairments:  Abnormal gait, Hypomobility, Increased edema, Decreased activity tolerance, Decreased strength, Increased fascial restricitons, Pain, Difficulty walking, Decreased mobility, Decreased balance, Decreased range of motion, Impaired perceived functional ability, Improper body mechanics, Impaired flexibility, Decreased coordination  Visit Diagnosis: Chronic pain of right knee  Muscle weakness (generalized)  Stiffness of right knee, not elsewhere classified  Difficulty in walking, not elsewhere classified  Localized edema     Problem List Patient Active Problem List   Diagnosis Date Noted   Status post total right knee replacement 03/19/2021   Primary osteoarthritis of left knee 11/29/2020   Primary osteoarthritis of right knee 06/23/2020   Vitamin D deficiency 01/29/2016   Optic neuritis 10/24/2014   Other fatigue 10/24/2014   High risk medication use 10/19/2014   Contraception management 08/05/2011   Multiple sclerosis ([place]) 08/26/1997   Scot Jun, PT, DPT, OCS, ATC 05/09/21  9:58 AM    [facility] [address] [place], [place], [address] Phone: [phone]   Fax:  [phone]  Name: Braniah Kaplowitz MRN: [id number] Date of Birth: March 14, 1961

## 2021-05-10 ENCOUNTER — Ambulatory Visit
Admission: RE | Admit: 2021-05-10 | Discharge: 2021-05-10 | Disposition: A | Discharge status: Home / Self Care | Payer: 59 | Source: Ambulatory Visit | Attending: Internal Medicine | Admitting: Internal Medicine

## 2021-05-10 DIAGNOSIS — R918 Other nonspecific abnormal finding of lung field: Secondary | ICD-10-CM | POA: Diagnosis not present

## 2021-05-10 DIAGNOSIS — R9389 Abnormal findings on diagnostic imaging of other specified body structures: Secondary | ICD-10-CM

## 2021-05-14 ENCOUNTER — Encounter: Payer: Self-pay | Admitting: Rehabilitative and Restorative Service Providers"

## 2021-05-14 ENCOUNTER — Ambulatory Visit (INDEPENDENT_AMBULATORY_CARE_PROVIDER_SITE_OTHER): Payer: 59 | Admitting: Rehabilitative and Restorative Service Providers"

## 2021-05-14 ENCOUNTER — Other Ambulatory Visit: Payer: Self-pay

## 2021-05-14 DIAGNOSIS — R262 Difficulty in walking, not elsewhere classified: Secondary | ICD-10-CM

## 2021-05-14 DIAGNOSIS — G8929 Other chronic pain: Secondary | ICD-10-CM

## 2021-05-14 DIAGNOSIS — M6281 Muscle weakness (generalized): Secondary | ICD-10-CM

## 2021-05-14 DIAGNOSIS — R600 Localized edema: Secondary | ICD-10-CM

## 2021-05-14 DIAGNOSIS — M25661 Stiffness of right knee, not elsewhere classified: Secondary | ICD-10-CM | POA: Diagnosis not present

## 2021-05-14 DIAGNOSIS — M25561 Pain in right knee: Secondary | ICD-10-CM

## 2021-05-14 NOTE — Therapy (Signed)
[place], [place], [address] Phone: [phone]   Fax:  [phone]  Physical Therapy Treatment/Progress Note  Patient Details  Name: Jill Hall MRN: [id number] Date of Birth: [birth year] Referring Provider (PT): Dr. Frankey Shown   Encounter Date: 05/14/2021  Progress Note Reporting Period 04/12/2021 to 05/14/2021  See note below for Objective Data and Assessment of Progress/Goals.       PT End of Session - 05/14/21 0937     Visit Number 10    Number of Visits 20    Date for PT Re-Evaluation 06/21/21    Authorization Type [facility], BCBS    Progress Note Due on Visit 20    PT Start Time 0928    PT Stop Time 1008    PT Time Calculation (min) 40 min    Activity Tolerance Patient tolerated treatment well    Behavior During Therapy [facility] for tasks assessed/performed             Past Medical History:  Diagnosis Date   Arthritis    both knees   Hypertension    Multiple sclerosis ([place])    Neuromuscular disorder ([place])    Multiple Sclerosis- affects her vision only    Past Surgical History:  Procedure Laterality Date   CESAREAN SECTION     TOTAL KNEE ARTHROPLASTY Right 03/19/2021   Procedure: RIGHT TOTAL KNEE ARTHROPLASTY;  Surgeon: Leandrew Koyanagi, MD;  Location: [place];  Service: Orthopedics;  Laterality: Right;   UTERINE FIBROID SURGERY      There were no vitals filed for this visit.   Subjective Assessment - 05/14/21 0936     Subjective Pt. stated no new complaints today.    Limitations Standing;Walking;House hold activities;Lifting;Sitting    Patient Stated Goals Reduce pain, walk independently    Currently in Pain? No/denies    Pain Score 0-No pain    Pain Onset More than a month ago                [facility] PT Assessment - 05/14/21 0001       Assessment   Medical Diagnosis OA Rt knee, s/p Rt TKA    Referring Provider (PT) Dr. Frankey Shown    Onset Date/Surgical Date 03/19/21       Observation/Other Assessments   Focus on Therapeutic Outcomes (FOTO)  updated 85%      AROM   Right Knee Extension -5   in LAQ,  quad set in supine -4   Right Knee Flexion 105      PROM   Right Knee Flexion 109   in supine heel slide c overpressure     Strength   Right Knee Extension 5/5   49, 42 lbs   Left Knee Extension 5/5   45.7, 49.2 lbs     Transfers   Five time sit to stand comments  15.78 seconds no UE 18 inch chair                           [facility] Adult PT Treatment/Exercise - 05/14/21 0001       Neuro Re-ed    Neuro Re-ed Details  fitter fwd/back wobble board ankle control 30 x c min to moderate hand on bar use, SLS on foam 30 sec x 4 bilateral c occasional hand assist on bar      Knee/Hip Exercises: Aerobic   Recumbent Bike Lvl 3 10 mins seat 7  Knee/Hip Exercises: Machines for Strengthening   Cybex Knee Extension eccentric Rt leg lowering 15 lbs 3 x 10      Knee/Hip Exercises: Standing   Other Standing Knee Exercises fwd step up/down 6 inch on Rt leg c one hand assist on bar x 15, x 10                       PT Short Term Goals - 05/03/21 0847       PT SHORT TERM GOAL #1   Title Patient will demonstrate independent use of home exercise program to maintain progress from in clinic treatments.    Time 3    Period Weeks    Status Achieved    Target Date 05/03/21               PT Long Term Goals - 05/14/21 0948       PT LONG TERM GOAL #1   Title Patient will demonstrate/report pain at worst less than or equal to 2/10 to facilitate minimal limitation in daily activity secondary to pain symptoms.    Time 10    Period Weeks    Status On-going    Target Date 06/21/21      PT LONG TERM GOAL #2   Title Patient will demonstrate independent use of home exercise program to facilitate ability to maintain/progress functional gains from skilled physical therapy services.    Time 10    Period Weeks    Status On-going     Target Date 06/21/21      PT LONG TERM GOAL #3   Title Patient will demonstrate independent ambulation community distances > 300 ft to facilitate community integration at [facility].    Time 10    Period Weeks    Status Achieved      PT LONG TERM GOAL #4   Title Patient will demonstrate Rt knee AROM 0-110 degrees to facilitate ability to perform transfers, sitting, ambulation, stair navigation s restriction due to mobility.    Time 10    Period Weeks    Status On-going    Target Date 06/21/21      PT LONG TERM GOAL #5   Title Patient will demonstrate Rt LE MMT 5/5 throughout to facilitate ability to perform usual standing, walking, stairs at PLOF s limitation due to symptoms.    Time 10    Period Weeks    Status Achieved      PT LONG TERM GOAL #6   Title Pt. will demonstrate FOTO outcome > or = 60% to indicated reduced disability due to condition.    Time 10    Period Weeks    Status Achieved    Target Date 06/21/21      PT LONG TERM GOAL #7   Title Pt. will demonstrate reciprocal gait pattern s UE assist for household and football stadium navigation.    Time 10    Period Weeks    Status Achieved                   Plan - 05/14/21 0947     Clinical Impression Statement Pt. has attended 10 visits overall during course of treatment.  See objective data for updated information.  Pt. has made good gains to this point c mild defiicts noted in Rt knee AROM and movement coordination.  Pt. may continue to benefit from skilled PT services to continue progression towards reaching established goals and reduced difficulty due to  presentation.    Personal Factors and Comorbidities Comorbidity 2    Comorbidities HTN, MS    Examination-Activity Limitations Sit;Sleep;Squat;Bend;Stairs;Carry;Stand;Transfers;Lift;Locomotion Level    Examination-Participation Restrictions Community Activity;Cleaning;Shop;Driving;Interpersonal Relationship;Yard Work;Laundry;Meal Prep;Occupation     Stability/Clinical Decision Making Stable/Uncomplicated    Rehab Potential Good    PT Frequency 2x / week    PT Duration Other (comment)   10 weeks   PT Treatment/Interventions ADLs/Self Care Home Management;Cryotherapy;Electrical Stimulation;Iontophoresis '4mg'$ /ml Dexamethasone;Moist Heat;Balance training;Therapeutic exercise;Therapeutic activities;Functional mobility training;Stair training;Gait training;Ultrasound;Neuromuscular re-education;Patient/family education;Passive range of motion;Dry needling;Vasopneumatic Device;Manual techniques    PT Next Visit Plan Functional mobility improvements in WB,NWB strengthening, compliant surface and dynamic balance.    PT Home Exercise Plan [id number]    Consulted and Agree with Plan of Care Patient             Patient will benefit from skilled therapeutic intervention in order to improve the following deficits and impairments:  Abnormal gait, Hypomobility, Increased edema, Decreased activity tolerance, Decreased strength, Increased fascial restricitons, Pain, Difficulty walking, Decreased mobility, Decreased balance, Decreased range of motion, Impaired perceived functional ability, Improper body mechanics, Impaired flexibility, Decreased coordination  Visit Diagnosis: Chronic pain of right knee  Muscle weakness (generalized)  Stiffness of right knee, not elsewhere classified  Difficulty in walking, not elsewhere classified  Localized edema     Problem List Patient Active Problem List   Diagnosis Date Noted   Status post total right knee replacement 03/19/2021   Primary osteoarthritis of left knee 11/29/2020   Primary osteoarthritis of right knee 06/23/2020   Vitamin D deficiency 01/29/2016   Optic neuritis 10/24/2014   Other fatigue 10/24/2014   High risk medication use 10/19/2014   Contraception management 08/05/2011   Multiple sclerosis ([place]) 08/26/1997    Scot Jun, PT, DPT, OCS, ATC 05/14/21  10:08 AM    [facility] [address] [place], [place], [address] Phone: [phone]   Fax:  [phone]  Name: Jill Hall MRN: [id number] Date of Birth: 1961-02-25

## 2021-05-16 ENCOUNTER — Other Ambulatory Visit: Payer: Self-pay

## 2021-05-16 ENCOUNTER — Encounter: Payer: Self-pay | Admitting: Physical Therapy

## 2021-05-16 ENCOUNTER — Ambulatory Visit (INDEPENDENT_AMBULATORY_CARE_PROVIDER_SITE_OTHER): Payer: 59 | Admitting: Physical Therapy

## 2021-05-16 DIAGNOSIS — R600 Localized edema: Secondary | ICD-10-CM | POA: Diagnosis not present

## 2021-05-16 DIAGNOSIS — G8929 Other chronic pain: Secondary | ICD-10-CM | POA: Diagnosis not present

## 2021-05-16 DIAGNOSIS — R262 Difficulty in walking, not elsewhere classified: Secondary | ICD-10-CM | POA: Diagnosis not present

## 2021-05-16 DIAGNOSIS — M6281 Muscle weakness (generalized): Secondary | ICD-10-CM

## 2021-05-16 DIAGNOSIS — M25661 Stiffness of right knee, not elsewhere classified: Secondary | ICD-10-CM | POA: Diagnosis not present

## 2021-05-16 DIAGNOSIS — M25561 Pain in right knee: Secondary | ICD-10-CM

## 2021-05-16 NOTE — Therapy (Signed)
[facility] Therapy [address] [place], [place], [address] Phone: [phone]   Fax:  [phone]  Physical Therapy Treatment  Patient Details  Name: Jill Hall MRN: [id number] Date of Birth: 1961-04-09 Referring Provider (PT): Dr. Frankey Shown   Encounter Date: 05/16/2021   PT End of Session - 05/16/21 0929     Visit Number 11    Number of Visits 20    Date for PT Re-Evaluation 06/21/21    Authorization Type [facility] [employer], BCBS    Progress Note Due on Visit 20    PT Start Time 0930    PT Stop Time 1011    PT Time Calculation (min) 41 min    Activity Tolerance Patient tolerated treatment well    Behavior During Therapy [facility] for tasks assessed/performed             Past Medical History:  Diagnosis Date   Arthritis    both knees   Hypertension    Multiple sclerosis ([place])    Neuromuscular disorder ([place])    Multiple Sclerosis- affects her vision only    Past Surgical History:  Procedure Laterality Date   CESAREAN SECTION     TOTAL KNEE ARTHROPLASTY Right 03/19/2021   Procedure: RIGHT TOTAL KNEE ARTHROPLASTY;  Surgeon: Leandrew Koyanagi, MD;  Location: [place];  Service: Orthopedics;  Laterality: Right;   UTERINE FIBROID SURGERY      There were no vitals filed for this visit.   Subjective Assessment - 05/16/21 0930     Subjective She has been doing her exercises. She purchased a stepper to practice at home.    Limitations Standing;Walking;House hold activities;Lifting;Sitting    Patient Stated Goals Reduce pain, walk independently    Currently in Pain? No/denies   just stiffness near incision on outside   Pain Onset More than a month ago                [facility] PT Assessment - 05/16/21 0930       Assessment   Medical Diagnosis OA Rt knee, s/p Rt TKA    Referring Provider (PT) Dr. Frankey Shown    Onset Date/Surgical Date 03/19/21      AROM   Right Knee Extension -4   prone after hang & exercises                           [facility] Adult PT Treatment/Exercise - 05/16/21 0930       Transfers   Five time sit to stand comments  --      Neuro Re-ed    Neuro Re-ed Details  fitter fwd/back wobble board ankle control 30 x c min to moderate hand on bar use, SLS on foam 30 sec x 4 bilateral c occasional hand assist on bar      Knee/Hip Exercises: Stretches   Gastroc Stretch Right;2 reps;30 seconds    Gastroc Stretch Limitations step with heel depression      Knee/Hip Exercises: Aerobic   Recumbent Bike Lvl 3 10 mins seat 7      Knee/Hip Exercises: Machines for Strengthening   Cybex Knee Extension eccentric Rt leg lowering 15 lbs 3 x 10      Knee/Hip Exercises: Standing   Forward Step Up Right;2 sets;15 reps;Hand Hold: 1;Step Height: 6"    Forward Step Up Limitations demo & verbal cues on technique    Step Down Right;2 sets;15 reps;Hand Hold: 1;Step Height: 6"  Step Down Limitations demo & verbal cues on technique    Other Standing Knee Exercises --      Knee/Hip Exercises: Prone   Hamstring Curl 2 sets;10 reps    Hamstring Curl Limitations 4#    Prone Knee Hang 2 minutes   2 sets 1 before prone exercises & 1 after exercises   Prone Knee Hang Weights (lbs) 4    Straight Leg Raises Strengthening;Right;2 sets;10 reps   4#                      PT Short Term Goals - 05/03/21 0847       PT SHORT TERM GOAL #1   Title Patient will demonstrate independent use of home exercise program to maintain progress from in clinic treatments.    Time 3    Period Weeks    Status Achieved    Target Date 05/03/21               PT Long Term Goals - 05/14/21 0948       PT LONG TERM GOAL #1   Title Patient will demonstrate/report pain at worst less than or equal to 2/10 to facilitate minimal limitation in daily activity secondary to pain symptoms.    Time 10    Period Weeks    Status On-going    Target Date 06/21/21      PT LONG TERM GOAL #2   Title Patient will  demonstrate independent use of home exercise program to facilitate ability to maintain/progress functional gains from skilled physical therapy services.    Time 10    Period Weeks    Status On-going    Target Date 06/21/21      PT LONG TERM GOAL #3   Title Patient will demonstrate independent ambulation community distances > 300 ft to facilitate community integration at [facility].    Time 10    Period Weeks    Status Achieved      PT LONG TERM GOAL #4   Title Patient will demonstrate Rt knee AROM 0-110 degrees to facilitate ability to perform transfers, sitting, ambulation, stair navigation s restriction due to mobility.    Time 10    Period Weeks    Status On-going    Target Date 06/21/21      PT LONG TERM GOAL #5   Title Patient will demonstrate Rt LE MMT 5/5 throughout to facilitate ability to perform usual standing, walking, stairs at PLOF s limitation due to symptoms.    Time 10    Period Weeks    Status Achieved      PT LONG TERM GOAL #6   Title Pt. will demonstrate FOTO outcome > or = 60% to indicated reduced disability due to condition.    Time 10    Period Weeks    Status Achieved    Target Date 06/21/21      PT LONG TERM GOAL #7   Title Pt. will demonstrate reciprocal gait pattern s UE assist for household and football stadium navigation.    Time 10    Period Weeks    Status Achieved                   Plan - 05/16/21 0930     Clinical Impression Statement PT added prone exercises and hang to program to increase PROM extension & flexion strength.  Pt improved step up & down with instruction in technique    Personal Factors and  Comorbidities Comorbidity 2    Comorbidities HTN, MS    Examination-Activity Limitations Sit;Sleep;Squat;Bend;Stairs;Carry;Stand;Transfers;Lift;Locomotion Level    Examination-Participation Restrictions Community Activity;Cleaning;Shop;Driving;Interpersonal Relationship;Yard Work;Laundry;Meal Prep;Occupation    Stability/Clinical  Decision Making Stable/Uncomplicated    Rehab Potential Good    PT Frequency 2x / week    PT Duration Other (comment)   10 weeks   PT Treatment/Interventions ADLs/Self Care Home Management;Cryotherapy;Electrical Stimulation;Iontophoresis 4mg /ml Dexamethasone;Moist Heat;Balance training;Therapeutic exercise;Therapeutic activities;Functional mobility training;Stair training;Gait training;Ultrasound;Neuromuscular re-education;Patient/family education;Passive range of motion;Dry needling;Vasopneumatic Device;Manual techniques    PT Next Visit Plan contine with Functional mobility improvements in WB,NWB strengthening, compliant surface and dynamic balance.    PT Home Exercise Plan [id number]    Consulted and Agree with Plan of Care Patient             Patient will benefit from skilled therapeutic intervention in order to improve the following deficits and impairments:  Abnormal gait, Hypomobility, Increased edema, Decreased activity tolerance, Decreased strength, Increased fascial restricitons, Pain, Difficulty walking, Decreased mobility, Decreased balance, Decreased range of motion, Impaired perceived functional ability, Improper body mechanics, Impaired flexibility, Decreased coordination  Visit Diagnosis: Chronic pain of right knee  Muscle weakness (generalized)  Stiffness of right knee, not elsewhere classified  Difficulty in walking, not elsewhere classified  Localized edema     Problem List Patient Active Problem List   Diagnosis Date Noted   Status post total right knee replacement 03/19/2021   Primary osteoarthritis of left knee 11/29/2020   Primary osteoarthritis of right knee 06/23/2020   Vitamin D deficiency 01/29/2016   Optic neuritis 10/24/2014   Other fatigue 10/24/2014   High risk medication use 10/19/2014   Contraception management 08/05/2011   Multiple sclerosis ([place]) 08/26/1997    Jamey Reas, PT, DPT 05/16/2021, 10:17 AM  [facility] Physical  Therapy [address] [place], [place], [address] Phone: [phone]   Fax:  [phone]  Name: Jill Hall MRN: [id number] Date of Birth: 02-13-1961

## 2021-05-18 ENCOUNTER — Telehealth: Payer: Self-pay | Admitting: Orthopaedic Surgery

## 2021-05-18 NOTE — Telephone Encounter (Signed)
Received $25.00 check and medical records release form/ Forwarding to [employer] today 

## 2021-05-21 ENCOUNTER — Other Ambulatory Visit: Payer: Self-pay

## 2021-05-21 ENCOUNTER — Other Ambulatory Visit (HOSPITAL_COMMUNITY): Payer: Self-pay

## 2021-05-21 ENCOUNTER — Encounter: Payer: Self-pay | Admitting: Rehabilitative and Restorative Service Providers"

## 2021-05-21 ENCOUNTER — Ambulatory Visit (INDEPENDENT_AMBULATORY_CARE_PROVIDER_SITE_OTHER): Payer: 59 | Admitting: Rehabilitative and Restorative Service Providers"

## 2021-05-21 DIAGNOSIS — M25561 Pain in right knee: Secondary | ICD-10-CM | POA: Diagnosis not present

## 2021-05-21 DIAGNOSIS — M25661 Stiffness of right knee, not elsewhere classified: Secondary | ICD-10-CM | POA: Diagnosis not present

## 2021-05-21 DIAGNOSIS — M6281 Muscle weakness (generalized): Secondary | ICD-10-CM

## 2021-05-21 DIAGNOSIS — G8929 Other chronic pain: Secondary | ICD-10-CM

## 2021-05-21 DIAGNOSIS — R600 Localized edema: Secondary | ICD-10-CM | POA: Diagnosis not present

## 2021-05-21 DIAGNOSIS — R262 Difficulty in walking, not elsewhere classified: Secondary | ICD-10-CM | POA: Diagnosis not present

## 2021-05-21 NOTE — Therapy (Signed)
[facility] Physical Therapy [address] [place], [place], [address] Phone: [phone]   Fax:  [phone]  Physical Therapy Treatment  Patient Details  Name: Jill Hall MRN: [id number] Date of Birth: [birth year] Referring Provider (PT): Dr. Frankey Shown   Encounter Date: 05/21/2021   PT End of Session - 05/21/21 0933     Visit Number 12    Number of Visits 20    Date for PT Re-Evaluation 06/21/21    Authorization Type [facility], BCBS    Progress Note Due on Visit 20    PT Start Time 0930    PT Stop Time 1010    PT Time Calculation (min) 40 min    Activity Tolerance Patient tolerated treatment well    Behavior During Therapy [facility] for tasks assessed/performed             Past Medical History:  Diagnosis Date   Arthritis    both knees   Hypertension    Multiple sclerosis ([place])    Neuromuscular disorder ([place])    Multiple Sclerosis- affects her vision only    Past Surgical History:  Procedure Laterality Date   CESAREAN SECTION     TOTAL KNEE ARTHROPLASTY Right 03/19/2021   Procedure: RIGHT TOTAL KNEE ARTHROPLASTY;  Surgeon: Leandrew Koyanagi, MD;  Location: [place];  Service: Orthopedics;  Laterality: Right;   UTERINE FIBROID SURGERY      There were no vitals filed for this visit.   Subjective Assessment - 05/21/21 0932     Subjective Pt. indicated no pain upon arrival, just tightness.  Pt. stated Lt knee was started to get painful.    Limitations Standing;Walking;House hold activities;Lifting;Sitting    Patient Stated Goals Reduce pain, walk independently    Currently in Pain? No/denies    Pain Score 0-No pain    Pain Onset More than a month ago                               [facility] Adult PT Treatment/Exercise - 05/21/21 0001       Neuro Re-ed    Neuro Re-ed Details  SLS on foam occasional HHA 30 sec x 4 bilateral, fitter board fwd/back 30 x each way occasional HHA, lateral stepping 3 cones x 8 bilateral      Exercises    Other Exercises  Verbal review of existing HEP (quad strengthening in SLR, heel prop/prone hang)      Knee/Hip Exercises: Aerobic   Recumbent Bike Lvl 3 7 mins seat 7      Knee/Hip Exercises: Machines for Strengthening   Cybex Knee Extension eccentric Rt leg lowering 15 lbs 3 x 15      Knee/Hip Exercises: Standing   Lateral Step Up Step Height: 6";Hand Hold: 1;3 sets;10 reps;Right   eccentric lowering focus                      PT Short Term Goals - 05/03/21 0847       PT SHORT TERM GOAL #1   Title Patient will demonstrate independent use of home exercise program to maintain progress from in clinic treatments.    Time 3    Period Weeks    Status Achieved    Target Date 05/03/21               PT Long Term Goals - 05/14/21 0948       PT LONG TERM  GOAL #1   Title Patient will demonstrate/report pain at worst less than or equal to 2/10 to facilitate minimal limitation in daily activity secondary to pain symptoms.    Time 10    Period Weeks    Status On-going    Target Date 06/21/21      PT LONG TERM GOAL #2   Title Patient will demonstrate independent use of home exercise program to facilitate ability to maintain/progress functional gains from skilled physical therapy services.    Time 10    Period Weeks    Status On-going    Target Date 06/21/21      PT LONG TERM GOAL #3   Title Patient will demonstrate independent ambulation community distances > 300 ft to facilitate community integration at [facility].    Time 10    Period Weeks    Status Achieved      PT LONG TERM GOAL #4   Title Patient will demonstrate Rt knee AROM 0-110 degrees to facilitate ability to perform transfers, sitting, ambulation, stair navigation s restriction due to mobility.    Time 10    Period Weeks    Status On-going    Target Date 06/21/21      PT LONG TERM GOAL #5   Title Patient will demonstrate Rt LE MMT 5/5 throughout to facilitate ability to perform usual standing,  walking, stairs at PLOF s limitation due to symptoms.    Time 10    Period Weeks    Status Achieved      PT LONG TERM GOAL #6   Title Pt. will demonstrate FOTO outcome > or = 60% to indicated reduced disability due to condition.    Time 10    Period Weeks    Status Achieved    Target Date 06/21/21      PT LONG TERM GOAL #7   Title Pt. will demonstrate reciprocal gait pattern s UE assist for household and football stadium navigation.    Time 10    Period Weeks    Status Achieved                   Plan - 05/21/21 0949     Clinical Impression Statement Eccentric lowering control on step still difficult and a concern for stair navigation s increased difficulty.  Fair to good performance on neuro re-ed control today. Continued skilled PT services recommended at this time c eye on transitioning towards HEP d/c in future weeks.    Personal Factors and Comorbidities Comorbidity 2    Comorbidities HTN, MS    Examination-Activity Limitations Sit;Sleep;Squat;Bend;Stairs;Carry;Stand;Transfers;Lift;Locomotion Level    Examination-Participation Restrictions Community Activity;Cleaning;Shop;Driving;Interpersonal Relationship;Yard Work;Laundry;Meal Prep;Occupation    Stability/Clinical Decision Making Stable/Uncomplicated    Rehab Potential Good    PT Frequency 2x / week    PT Duration Other (comment)   10 weeks   PT Treatment/Interventions ADLs/Self Care Home Management;Cryotherapy;Electrical Stimulation;Iontophoresis 4mg /ml Dexamethasone;Moist Heat;Balance training;Therapeutic exercise;Therapeutic activities;Functional mobility training;Stair training;Gait training;Ultrasound;Neuromuscular re-education;Patient/family education;Passive range of motion;Dry needling;Vasopneumatic Device;Manual techniques    PT Next Visit Plan Eccentric stair control improvements, dynamic and compliant surface balance.    PT Home Exercise Plan [id number]    Consulted and Agree with Plan of Care Patient              Patient will benefit from skilled therapeutic intervention in order to improve the following deficits and impairments:  Abnormal gait, Hypomobility, Increased edema, Decreased activity tolerance, Decreased strength, Increased fascial restricitons, Pain, Difficulty walking, Decreased mobility, Decreased balance,  Decreased range of motion, Impaired perceived functional ability, Improper body mechanics, Impaired flexibility, Decreased coordination  Visit Diagnosis: Chronic pain of right knee  Muscle weakness (generalized)  Stiffness of right knee, not elsewhere classified  Difficulty in walking, not elsewhere classified  Localized edema     Problem List Patient Active Problem List   Diagnosis Date Noted   Status post total right knee replacement 03/19/2021   Primary osteoarthritis of left knee 11/29/2020   Primary osteoarthritis of right knee 06/23/2020   Vitamin D deficiency 01/29/2016   Optic neuritis 10/24/2014   Other fatigue 10/24/2014   High risk medication use 10/19/2014   Contraception management 08/05/2011   Multiple sclerosis ([place]) 08/26/1997   Scot Jun, PT, DPT, OCS, ATC 05/21/21  10:07 AM   [facility] [address] [place], [place], [address] Phone: [phone]   Fax:  [phone]  Name: Jill Hall MRN: [id number] Date of Birth: 11/02/1960

## 2021-05-22 ENCOUNTER — Other Ambulatory Visit (HOSPITAL_COMMUNITY): Payer: Self-pay

## 2021-05-22 ENCOUNTER — Telehealth: Payer: Self-pay | Admitting: Orthopaedic Surgery

## 2021-05-22 NOTE — Telephone Encounter (Signed)
[place] forms received. To [employer]. ?

## 2021-05-23 ENCOUNTER — Encounter: Payer: 59 | Admitting: Rehabilitative and Restorative Service Providers"

## 2021-05-24 ENCOUNTER — Telehealth: Payer: Self-pay | Admitting: Orthopaedic Surgery

## 2021-05-24 NOTE — Telephone Encounter (Signed)
Received medical records release form,$25.00 check and L/T disability paperwork from patient  Forwarding to [place] today

## 2021-05-28 ENCOUNTER — Ambulatory Visit (INDEPENDENT_AMBULATORY_CARE_PROVIDER_SITE_OTHER): Payer: 59 | Admitting: Rehabilitative and Restorative Service Providers"

## 2021-05-28 ENCOUNTER — Other Ambulatory Visit: Payer: Self-pay

## 2021-05-28 ENCOUNTER — Encounter: Payer: Self-pay | Admitting: Rehabilitative and Restorative Service Providers"

## 2021-05-28 DIAGNOSIS — R262 Difficulty in walking, not elsewhere classified: Secondary | ICD-10-CM

## 2021-05-28 DIAGNOSIS — R600 Localized edema: Secondary | ICD-10-CM

## 2021-05-28 DIAGNOSIS — G8929 Other chronic pain: Secondary | ICD-10-CM

## 2021-05-28 DIAGNOSIS — M25661 Stiffness of right knee, not elsewhere classified: Secondary | ICD-10-CM | POA: Diagnosis not present

## 2021-05-28 DIAGNOSIS — M6281 Muscle weakness (generalized): Secondary | ICD-10-CM | POA: Diagnosis not present

## 2021-05-28 DIAGNOSIS — M25561 Pain in right knee: Secondary | ICD-10-CM

## 2021-05-28 NOTE — Therapy (Signed)
[facility] Physical Therapy [address] [place], [place], [address] Phone: [phone]   Fax:  [phone]  Physical Therapy Treatment  Patient Details  Name: Jill Hall MRN: [id number] Date of Birth: December 11, 1960 Referring Provider (PT): Dr. Frankey Shown   Encounter Date: 05/28/2021   PT End of Session - 05/28/21 0850     Visit Number 13    Number of Visits 20    Date for PT Re-Evaluation 06/21/21    Authorization Type [facility], BCBS    Progress Note Due on Visit 20    PT Start Time 0845    PT Stop Time 0924    PT Time Calculation (min) 39 min    Activity Tolerance Patient tolerated treatment well    Behavior During Therapy [facility] for tasks assessed/performed             Past Medical History:  Diagnosis Date   Arthritis    both knees   Hypertension    Multiple sclerosis ([place])    Neuromuscular disorder ([place])    Multiple Sclerosis- affects her vision only    Past Surgical History:  Procedure Laterality Date   CESAREAN SECTION     TOTAL KNEE ARTHROPLASTY Right 03/19/2021   Procedure: RIGHT TOTAL KNEE ARTHROPLASTY;  Surgeon: Leandrew Koyanagi, MD;  Location: [place];  Service: Orthopedics;  Laterality: Right;   UTERINE FIBROID SURGERY      There were no vitals filed for this visit.   Subjective Assessment - 05/28/21 0849     Subjective Pt. reported stiffness c weather change this weekend but nothing painful really today upon arrival.    Limitations Standing;Walking;House hold activities;Lifting;Sitting    Patient Stated Goals Reduce pain, walk independently    Currently in Pain? No/denies    Pain Score 0-No pain    Pain Location Knee    Pain Orientation Right    Pain Descriptors / Indicators Tightness    Pain Type Chronic pain;Surgical pain    Pain Onset More than a month ago    Pain Frequency Intermittent    Aggravating Factors  weather change    Pain Relieving Factors some movement                [facility] PT Assessment - 05/28/21  0001       Assessment   Medical Diagnosis OA Rt knee, s/p Rt TKA    Referring Provider (PT) Dr. Frankey Shown    Onset Date/Surgical Date 03/19/21      AROM   Right Knee Flexion 105                           [facility] Adult PT Treatment/Exercise - 05/28/21 0001       Neuro Re-ed    Neuro Re-ed Details  tandem ambulation on foam in bars 8 ft x 6 fwd/back each way, wobble board fwd/back 30x each way, SLS c cone touch (anterior, anterior/medial, anterior/lateral) x 10 each bilateral      Knee/Hip Exercises: Aerobic   Recumbent Bike Lvl 3 seat 7 6 mins      Knee/Hip Exercises: Machines for Strengthening   Cybex Knee Extension eccentric Rt leg lowering 15 lbs 3 x 15    Cybex Knee Flexion Rt leg 15 lbs 3 x 10      Knee/Hip Exercises: Standing   Lateral Step Up Step Height: 6";Hand Hold: 2;3 sets;10 reps;Left   eccentric lowering  PT Short Term Goals - 05/03/21 0847       PT SHORT TERM GOAL #1   Title Patient will demonstrate independent use of home exercise program to maintain progress from in clinic treatments.    Time 3    Period Weeks    Status Achieved    Target Date 05/03/21               PT Long Term Goals - 05/14/21 0948       PT LONG TERM GOAL #1   Title Patient will demonstrate/report pain at worst less than or equal to 2/10 to facilitate minimal limitation in daily activity secondary to pain symptoms.    Time 10    Period Weeks    Status On-going    Target Date 06/21/21      PT LONG TERM GOAL #2   Title Patient will demonstrate independent use of home exercise program to facilitate ability to maintain/progress functional gains from skilled physical therapy services.    Time 10    Period Weeks    Status On-going    Target Date 06/21/21      PT LONG TERM GOAL #3   Title Patient will demonstrate independent ambulation community distances > 300 ft to facilitate community integration at [facility].    Time 10     Period Weeks    Status Achieved      PT LONG TERM GOAL #4   Title Patient will demonstrate Rt knee AROM 0-110 degrees to facilitate ability to perform transfers, sitting, ambulation, stair navigation s restriction due to mobility.    Time 10    Period Weeks    Status On-going    Target Date 06/21/21      PT LONG TERM GOAL #5   Title Patient will demonstrate Rt LE MMT 5/5 throughout to facilitate ability to perform usual standing, walking, stairs at PLOF s limitation due to symptoms.    Time 10    Period Weeks    Status Achieved      PT LONG TERM GOAL #6   Title Pt. will demonstrate FOTO outcome > or = 60% to indicated reduced disability due to condition.    Time 10    Period Weeks    Status Achieved    Target Date 06/21/21      PT LONG TERM GOAL #7   Title Pt. will demonstrate reciprocal gait pattern s UE assist for household and football stadium navigation.    Time 10    Period Weeks    Status Achieved                   Plan - 05/28/21 0914     Clinical Impression Statement Pt. continued to show gains in movement stability as noted in performance today.  Continued strengthening to benefit over next few visits with compliant surface balance.    Personal Factors and Comorbidities Comorbidity 2    Comorbidities HTN, MS    Examination-Activity Limitations Sit;Sleep;Squat;Bend;Stairs;Carry;Stand;Transfers;Lift;Locomotion Level    Examination-Participation Restrictions Community Activity;Cleaning;Shop;Driving;Interpersonal Relationship;Yard Work;Laundry;Meal Prep;Occupation    Stability/Clinical Decision Making Stable/Uncomplicated    Rehab Potential Good    PT Frequency 2x / week    PT Duration Other (comment)   10 weeks   PT Treatment/Interventions ADLs/Self Care Home Management;Cryotherapy;Electrical Stimulation;Iontophoresis 4mg /ml Dexamethasone;Moist Heat;Balance training;Therapeutic exercise;Therapeutic activities;Functional mobility training;Stair training;Gait  training;Ultrasound;Neuromuscular re-education;Patient/family education;Passive range of motion;Dry needling;Vasopneumatic Device;Manual techniques    PT Next Visit Plan Continue eccentric stair control improvements, dynamic and  compliant surface balance.    PT Home Exercise Plan [id number]    Consulted and Agree with Plan of Care Patient             Patient will benefit from skilled therapeutic intervention in order to improve the following deficits and impairments:  Abnormal gait, Hypomobility, Increased edema, Decreased activity tolerance, Decreased strength, Increased fascial restricitons, Pain, Difficulty walking, Decreased mobility, Decreased balance, Decreased range of motion, Impaired perceived functional ability, Improper body mechanics, Impaired flexibility, Decreased coordination  Visit Diagnosis: Chronic pain of right knee  Muscle weakness (generalized)  Stiffness of right knee, not elsewhere classified  Difficulty in walking, not elsewhere classified  Localized edema     Problem List Patient Active Problem List   Diagnosis Date Noted   Status post total right knee replacement 03/19/2021   Primary osteoarthritis of left knee 11/29/2020   Primary osteoarthritis of right knee 06/23/2020   Vitamin D deficiency 01/29/2016   Optic neuritis 10/24/2014   Other fatigue 10/24/2014   High risk medication use 10/19/2014   Contraception management 08/05/2011   Multiple sclerosis ([place]) 08/26/1997    Scot Jun, PT, DPT, OCS, ATC 05/28/21  9:22 AM    [facility] [address] [place], [place], [address] Phone: [phone]   Fax:  [phone]  Name: Jill Hall MRN: [id number] Date of Birth: 11/14/1960

## 2021-05-29 ENCOUNTER — Ambulatory Visit (INDEPENDENT_AMBULATORY_CARE_PROVIDER_SITE_OTHER): Payer: 59

## 2021-05-29 DIAGNOSIS — G35 Multiple sclerosis: Secondary | ICD-10-CM | POA: Diagnosis not present

## 2021-05-29 MED ORDER — GADOBENATE DIMEGLUMINE 529 MG/ML IV SOLN
20.0000 mL | Freq: Once | INTRAVENOUS | Status: AC | PRN
  Administered 2021-05-29: 20 mL via INTRAVENOUS

## 2021-06-01 ENCOUNTER — Other Ambulatory Visit: Payer: Self-pay | Admitting: Obstetrics & Gynecology

## 2021-06-01 ENCOUNTER — Telehealth: Payer: Self-pay | Admitting: Orthopaedic Surgery

## 2021-06-01 DIAGNOSIS — Z1231 Encounter for screening mammogram for malignant neoplasm of breast: Secondary | ICD-10-CM

## 2021-06-01 NOTE — Telephone Encounter (Signed)
Pt calling and asking what day is she suppose to return back to work?  CB [phone]

## 2021-06-04 ENCOUNTER — Encounter: Payer: Self-pay | Admitting: Rehabilitative and Restorative Service Providers"

## 2021-06-04 ENCOUNTER — Other Ambulatory Visit: Payer: Self-pay

## 2021-06-04 ENCOUNTER — Ambulatory Visit (INDEPENDENT_AMBULATORY_CARE_PROVIDER_SITE_OTHER): Payer: 59 | Admitting: Rehabilitative and Restorative Service Providers"

## 2021-06-04 DIAGNOSIS — M6281 Muscle weakness (generalized): Secondary | ICD-10-CM | POA: Diagnosis not present

## 2021-06-04 DIAGNOSIS — R600 Localized edema: Secondary | ICD-10-CM | POA: Diagnosis not present

## 2021-06-04 DIAGNOSIS — M25661 Stiffness of right knee, not elsewhere classified: Secondary | ICD-10-CM | POA: Diagnosis not present

## 2021-06-04 DIAGNOSIS — M25561 Pain in right knee: Secondary | ICD-10-CM | POA: Diagnosis not present

## 2021-06-04 DIAGNOSIS — G8929 Other chronic pain: Secondary | ICD-10-CM

## 2021-06-04 DIAGNOSIS — R262 Difficulty in walking, not elsewhere classified: Secondary | ICD-10-CM

## 2021-06-04 NOTE — Telephone Encounter (Signed)
What does she do for work and how does she feel about returning?

## 2021-06-04 NOTE — Therapy (Signed)
[facility] [address] [place], [place], [address] Phone: [phone]   Fax:  [phone]  Physical Therapy Treatment  Patient Details  Name: Jill Hall MRN: [id number] Date of Birth: June 09, 1961 Referring Provider (PT): Dr. Frankey Shown   Encounter Date: 06/04/2021   PT End of Session - 06/04/21 0851     Visit Number 14    Number of Visits 20    Date for PT Re-Evaluation 06/21/21    Authorization Type [facility], BCBS    Progress Note Due on Visit 20    PT Start Time 0846    PT Stop Time 0925    PT Time Calculation (min) 39 min    Activity Tolerance Patient tolerated treatment well    Behavior During Therapy [facility] for tasks assessed/performed             Past Medical History:  Diagnosis Date   Arthritis    both knees   Hypertension    Multiple sclerosis ([place])    Neuromuscular disorder ([place])    Multiple Sclerosis- affects her vision only    Past Surgical History:  Procedure Laterality Date   CESAREAN SECTION     TOTAL KNEE ARTHROPLASTY Right 03/19/2021   Procedure: RIGHT TOTAL KNEE ARTHROPLASTY;  Surgeon: Leandrew Koyanagi, MD;  Location: [place];  Service: Orthopedics;  Laterality: Right;   UTERINE FIBROID SURGERY      There were no vitals filed for this visit.   Subjective Assessment - 06/04/21 0850     Subjective Pt. indicated some sore/tired in front of thigh after doing leg lifts this weekend.    Limitations Standing;Walking;House hold activities;Lifting;Sitting    Patient Stated Goals Reduce pain, walk independently    Currently in Pain? No/denies    Pain Score 0-No pain    Pain Onset More than a month ago                [facility] PT Assessment - 06/04/21 0001       Functional Tests   Functional tests Step down      Step Down   Comments 6 inch lateral step down c fair control on Lt leg                           [facility] Adult PT Treatment/Exercise - 06/04/21 0001       Neuro Re-ed    Neuro  Re-ed Details  wobble board fwd/back 30x each way, SLS c cone touch (anterior, anterior/medial, anterior/lateral) x 8 each bilateral      Knee/Hip Exercises: Aerobic   Recumbent Bike Lvl 4 10 mins      Knee/Hip Exercises: Machines for Strengthening   Cybex Knee Extension eccentric Rt leg lowering 15 lbs 3 x 15    Cybex Knee Flexion Rt leg 15 lbs 3 x 10    Cybex Leg Press double leg 2 x 15 100 lbs, single leg 56 lbs 2 x 15 bilateral      Knee/Hip Exercises: Standing   Lateral Step Up Step Height: 6";Hand Hold: 2;3 sets;10 reps;Left   eccentric lowering                      PT Short Term Goals - 05/03/21 0847       PT SHORT TERM GOAL #1   Title Patient will demonstrate independent use of home exercise program to maintain progress from in clinic treatments.    Time  3    Period Weeks    Status Achieved    Target Date 05/03/21               PT Long Term Goals - 05/14/21 0948       PT LONG TERM GOAL #1   Title Patient will demonstrate/report pain at worst less than or equal to 2/10 to facilitate minimal limitation in daily activity secondary to pain symptoms.    Time 10    Period Weeks    Status On-going    Target Date 06/21/21      PT LONG TERM GOAL #2   Title Patient will demonstrate independent use of home exercise program to facilitate ability to maintain/progress functional gains from skilled physical therapy services.    Time 10    Period Weeks    Status On-going    Target Date 06/21/21      PT LONG TERM GOAL #3   Title Patient will demonstrate independent ambulation community distances > 300 ft to facilitate community integration at [facility].    Time 10    Period Weeks    Status Achieved      PT LONG TERM GOAL #4   Title Patient will demonstrate Rt knee AROM 0-110 degrees to facilitate ability to perform transfers, sitting, ambulation, stair navigation s restriction due to mobility.    Time 10    Period Weeks    Status On-going    Target Date  06/21/21      PT LONG TERM GOAL #5   Title Patient will demonstrate Rt LE MMT 5/5 throughout to facilitate ability to perform usual standing, walking, stairs at PLOF s limitation due to symptoms.    Time 10    Period Weeks    Status Achieved      PT LONG TERM GOAL #6   Title Pt. will demonstrate FOTO outcome > or = 60% to indicated reduced disability due to condition.    Time 10    Period Weeks    Status Achieved    Target Date 06/21/21      PT LONG TERM GOAL #7   Title Pt. will demonstrate reciprocal gait pattern s UE assist for household and football stadium navigation.    Time 10    Period Weeks    Status Achieved                   Plan - 06/04/21 1601     Clinical Impression Statement Pt. continued to show good movement towards possible trial d/c plan after next visit.  Can continue to gain overall strength in surgical leg as well as movement coordination in both leg.    Personal Factors and Comorbidities Comorbidity 2    Comorbidities HTN, MS    Examination-Activity Limitations Sit;Sleep;Squat;Bend;Stairs;Carry;Stand;Transfers;Lift;Locomotion Level    Examination-Participation Restrictions Community Activity;Cleaning;Shop;Driving;Interpersonal Relationship;Yard Work;Laundry;Meal Prep;Occupation    Stability/Clinical Decision Making Stable/Uncomplicated    Rehab Potential Good    PT Frequency 2x / week    PT Duration Other (comment)   10 weeks   PT Treatment/Interventions ADLs/Self Care Home Management;Cryotherapy;Electrical Stimulation;Iontophoresis 4mg /ml Dexamethasone;Moist Heat;Balance training;Therapeutic exercise;Therapeutic activities;Functional mobility training;Stair training;Gait training;Ultrasound;Neuromuscular re-education;Patient/family education;Passive range of motion;Dry needling;Vasopneumatic Device;Manual techniques    PT Next Visit Plan Continue eccentric stair control improvements, dynamic and compliant surface balance.  MD note, FOTO, possible  d/c.    PT Home Exercise Plan [id number]    Consulted and Agree with Plan of Care Patient  Patient will benefit from skilled therapeutic intervention in order to improve the following deficits and impairments:  Abnormal gait, Hypomobility, Increased edema, Decreased activity tolerance, Decreased strength, Increased fascial restricitons, Pain, Difficulty walking, Decreased mobility, Decreased balance, Decreased range of motion, Impaired perceived functional ability, Improper body mechanics, Impaired flexibility, Decreased coordination  Visit Diagnosis: Chronic pain of right knee  Muscle weakness (generalized)  Stiffness of right knee, not elsewhere classified  Difficulty in walking, not elsewhere classified  Localized edema     Problem List Patient Active Problem List   Diagnosis Date Noted   Status post total right knee replacement 03/19/2021   Primary osteoarthritis of left knee 11/29/2020   Primary osteoarthritis of right knee 06/23/2020   Vitamin D deficiency 01/29/2016   Optic neuritis 10/24/2014   Other fatigue 10/24/2014   High risk medication use 10/19/2014   Contraception management 08/05/2011   Multiple sclerosis ([place]) 08/26/1997   Scot Jun, PT, DPT, OCS, ATC 06/04/21  9:21 AM    [facility] [address] [place], [place], [address] Phone: [phone]   Fax:  [phone]  Name: Jill Hall MRN: [id number] Date of Birth: 02/02/1961

## 2021-06-06 NOTE — Telephone Encounter (Signed)
Called patient no answer LMOM.   What does she do for work? How does she feel about returning?

## 2021-06-11 ENCOUNTER — Ambulatory Visit (INDEPENDENT_AMBULATORY_CARE_PROVIDER_SITE_OTHER): Payer: 59 | Admitting: Rehabilitative and Restorative Service Providers"

## 2021-06-11 ENCOUNTER — Encounter: Payer: Self-pay | Admitting: Rehabilitative and Restorative Service Providers"

## 2021-06-11 ENCOUNTER — Other Ambulatory Visit: Payer: Self-pay

## 2021-06-11 DIAGNOSIS — M6281 Muscle weakness (generalized): Secondary | ICD-10-CM

## 2021-06-11 DIAGNOSIS — G8929 Other chronic pain: Secondary | ICD-10-CM | POA: Diagnosis not present

## 2021-06-11 DIAGNOSIS — M25661 Stiffness of right knee, not elsewhere classified: Secondary | ICD-10-CM

## 2021-06-11 DIAGNOSIS — M25561 Pain in right knee: Secondary | ICD-10-CM | POA: Diagnosis not present

## 2021-06-11 DIAGNOSIS — R600 Localized edema: Secondary | ICD-10-CM | POA: Diagnosis not present

## 2021-06-11 DIAGNOSIS — R262 Difficulty in walking, not elsewhere classified: Secondary | ICD-10-CM

## 2021-06-11 NOTE — Therapy (Signed)
[facility] [address] [place], [place], [address] Phone: [phone]   Fax:  [phone]  Physical Therapy Treatment/Discharge  Patient Details  Name: Jill Hall MRN: [id number] Date of Birth: [birth year] Referring Provider (PT): Dr. Frankey Shown   Encounter Date: 06/11/2021   PT End of Session - 06/11/21 0846     Visit Number 15    Number of Visits 20    Date for PT Re-Evaluation 06/21/21    Authorization Type [facility], BCBS    Progress Note Due on Visit 20    PT Start Time [phone]    PT Stop Time 0922    PT Time Calculation (min) 39 min    Activity Tolerance Patient tolerated treatment well    Behavior During Therapy [facility] for tasks assessed/performed             Past Medical History:  Diagnosis Date   Arthritis    both knees   Hypertension    Multiple sclerosis ([place])    Neuromuscular disorder ([place])    Multiple Sclerosis- affects her vision only    Past Surgical History:  Procedure Laterality Date   CESAREAN SECTION     TOTAL KNEE ARTHROPLASTY Right 03/19/2021   Procedure: RIGHT TOTAL KNEE ARTHROPLASTY;  Surgeon: Leandrew Koyanagi, MD;  Location: [place];  Service: Orthopedics;  Laterality: Right;   UTERINE FIBROID SURGERY      There were no vitals filed for this visit.   Subjective Assessment - 06/11/21 0845     Subjective Pt. indicated getting a desk pedal bike to use while at work.  Pt. indicated she has been working on single leg balance.  Pt. indicated she was in agreement for transitioning to HEP.    Limitations Standing;Walking;House hold activities;Lifting;Sitting    Patient Stated Goals Reduce pain, walk independently    Currently in Pain? No/denies    Pain Score 0-No pain    Pain Onset More than a month ago                [facility] PT Assessment - 06/11/21 0001       Assessment   Medical Diagnosis OA Rt knee, s/p Rt TKA    Referring Provider (PT) Dr. Frankey Shown    Onset Date/Surgical Date 03/19/21       AROM   Right Knee Extension -3   in LAQ   Right Knee Flexion 110                           [facility] Adult PT Treatment/Exercise - 06/11/21 0001       Neuro Re-ed    Neuro Re-ed Details  tandem stance on foam 1 min x 1 bilateral, fitter wobble board fwd/back 30x each      Knee/Hip Exercises: Stretches   Gastroc Stretch 60 seconds;3 reps;Both   incilne board     Knee/Hip Exercises: Aerobic   Recumbent Bike Lvl 4 10 mins      Knee/Hip Exercises: Machines for Strengthening   Cybex Knee Extension eccentric Rt leg lowering 15 lbs 3 x 15    Cybex Knee Flexion Rt leg 15 lbs 3 x 10    Cybex Leg Press double leg 2 x 15 100 lbs, single leg 56 lbs 2 x 15 bilateral                       PT Short Term Goals - 05/03/21 7616  PT SHORT TERM GOAL #1   Title Patient will demonstrate independent use of home exercise program to maintain progress from in clinic treatments.    Time 3    Period Weeks    Status Achieved    Target Date 05/03/21               PT Long Term Goals - 06/11/21 0851       PT LONG TERM GOAL #1   Title Patient will demonstrate/report pain at worst less than or equal to 2/10 to facilitate minimal limitation in daily activity secondary to pain symptoms.    Time 10    Period Weeks    Status Achieved      PT LONG TERM GOAL #2   Title Patient will demonstrate independent use of home exercise program to facilitate ability to maintain/progress functional gains from skilled physical therapy services.    Time 10    Period Weeks    Status Achieved      PT LONG TERM GOAL #3   Title Patient will demonstrate independent ambulation community distances > 300 ft to facilitate community integration at [facility].    Time 10    Period Weeks    Status Achieved      PT LONG TERM GOAL #4   Title Patient will demonstrate Rt knee AROM 0-110 degrees to facilitate ability to perform transfers, sitting, ambulation, stair navigation s restriction due to  mobility.    Time 10    Period Weeks    Status Partially Met      PT LONG TERM GOAL #5   Title Patient will demonstrate Rt LE MMT 5/5 throughout to facilitate ability to perform usual standing, walking, stairs at PLOF s limitation due to symptoms.    Time 10    Period Weeks    Status Achieved      PT LONG TERM GOAL #6   Title Pt. will demonstrate FOTO outcome > or = 60% to indicated reduced disability due to condition.    Time 10    Period Weeks    Status Achieved      PT LONG TERM GOAL #7   Title Pt. will demonstrate reciprocal gait pattern s UE assist for household and football stadium navigation.    Time 10    Period Weeks    Status Achieved                   Plan - 06/11/21 0849     Clinical Impression Statement Pt. has attended 15 visits overall during course of treatment, reporting good improvements of symptoms and progress.  See objective data updated for updated information. Pt. was appropriate for d/c to HEP at this time due to overall progress.    Personal Factors and Comorbidities Comorbidity 2    Comorbidities HTN, MS    Examination-Activity Limitations Sit;Sleep;Squat;Bend;Stairs;Carry;Stand;Transfers;Lift;Locomotion Level    Examination-Participation Restrictions Community Activity;Cleaning;Shop;Driving;Interpersonal Relationship;Yard Work;Laundry;Meal Prep;Occupation    Stability/Clinical Decision Making Stable/Uncomplicated    Rehab Potential Good    PT Frequency 2x / week    PT Duration Other (comment)   10 weeks   PT Treatment/Interventions ADLs/Self Care Home Management;Cryotherapy;Electrical Stimulation;Iontophoresis 37m/ml Dexamethasone;Moist Heat;Balance training;Therapeutic exercise;Therapeutic activities;Functional mobility training;Stair training;Gait training;Ultrasound;Neuromuscular re-education;Patient/family education;Passive range of motion;Dry needling;Vasopneumatic Device;Manual techniques    PT Next Visit Plan D/C to HEP    PT Home  Exercise Plan [id number]    Consulted and Agree with Plan of Care Patient  Patient will benefit from skilled therapeutic intervention in order to improve the following deficits and impairments:  Abnormal gait, Hypomobility, Increased edema, Decreased activity tolerance, Decreased strength, Increased fascial restricitons, Pain, Difficulty walking, Decreased mobility, Decreased balance, Decreased range of motion, Impaired perceived functional ability, Improper body mechanics, Impaired flexibility, Decreased coordination  Visit Diagnosis: Chronic pain of right knee  Muscle weakness (generalized)  Stiffness of right knee, not elsewhere classified  Difficulty in walking, not elsewhere classified  Localized edema     Problem List Patient Active Problem List   Diagnosis Date Noted   Status post total right knee replacement 03/19/2021   Primary osteoarthritis of left knee 11/29/2020   Primary osteoarthritis of right knee 06/23/2020   Vitamin D deficiency 01/29/2016   Optic neuritis 10/24/2014   Other fatigue 10/24/2014   High risk medication use 10/19/2014   Contraception management 08/05/2011   Multiple sclerosis ([place]) 08/26/1997    PHYSICAL THERAPY DISCHARGE SUMMARY  Visits from Start of Care: 15  Current functional level related to goals / functional outcomes: See note   Remaining deficits: See note   Education / Equipment: HEP   Patient agrees to discharge. Patient goals were met. Patient is being discharged due to being pleased with the current functional level.  Scot Jun, PT, DPT, OCS, ATC 06/11/21  9:19 AM     [facility] [address] [place], [place], [address] Phone: [phone]   Fax:  [phone]  Name: Beva Remund MRN: [id number] Date of Birth: 05-30-1961

## 2021-06-12 ENCOUNTER — Encounter: Payer: Self-pay | Admitting: Orthopaedic Surgery

## 2021-06-12 ENCOUNTER — Other Ambulatory Visit (HOSPITAL_COMMUNITY): Payer: Self-pay

## 2021-06-12 ENCOUNTER — Ambulatory Visit (INDEPENDENT_AMBULATORY_CARE_PROVIDER_SITE_OTHER): Payer: 59 | Admitting: Orthopaedic Surgery

## 2021-06-12 DIAGNOSIS — Z96651 Presence of right artificial knee joint: Secondary | ICD-10-CM

## 2021-06-12 MED ORDER — AMOXICILLIN 500 MG PO CAPS
2000.0000 mg | ORAL_CAPSULE | ORAL | 6 refills | Status: DC
  Filled 2021-06-12: qty 4, 1d supply, fill #0
  Filled 2021-12-13: qty 4, 1d supply, fill #1

## 2021-06-12 NOTE — Progress Notes (Signed)
   Post-Op Visit Note   Patient: Jill Hall           Date of Birth: 06-16-1961           MRN: [id number] Visit Date: 06/12/2021 PCP: Wenda Low, MD   Assessment & Plan:  Chief Complaint:  Chief Complaint  Patient presents with   Right Knee - Follow-up    Right TKA 03/19/2021   Visit Diagnoses:  1. Status post total right knee replacement     Plan: Jill Hall is now 12 weeks status post right total knee replacement.  Overall doing well takes occasional Tylenol.  Reports no pain.  She finished physical therapy yesterday in our office.  Range of motion is 3 to 110 degrees.  She has no complaints.  Right knee shows a fully healed surgical scar.  Range of motion is approximately 3 to 110 degrees.  Stable to varus valgus.  Mild residual swelling.  No signs of infection.  At this point Jill Hall will continue on with home exercises to try to get 120 degrees of flexion.  Overall she is very happy with her recovery and outcome.  Amoxicillin was prescribed today for upcoming dentist appointment.  Recheck in 3 months with two-view x-rays of the right knee.  Follow-Up Instructions: Return in about 3 months (around 09/12/2021).   Orders:  No orders of the defined types were placed in this encounter.  Meds ordered this encounter  Medications   amoxicillin (AMOXIL) 500 MG capsule    Sig: Take 4 capsules (2,000 mg total) by mouth once for 1 dose.    Dispense:  4 capsule    Refill:  6    Imaging: No results found.  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   Primary osteoarthritis of left knee 11/29/2020   Primary osteoarthritis of right knee 06/23/2020   Vitamin D deficiency 01/29/2016   Optic neuritis 10/24/2014   Other fatigue 10/24/2014   High risk medication use 10/19/2014   Contraception management 08/05/2011   Multiple sclerosis ([place]) 08/26/1997   Past Medical History:  Diagnosis Date    Arthritis    both knees   Hypertension    Multiple sclerosis ([place])    Neuromuscular disorder ([place])    Multiple Sclerosis- affects her vision only    Family History  Problem Relation Age of Onset   Breast cancer Mother 68    Past Surgical History:  Procedure Laterality Date   CESAREAN SECTION     TOTAL KNEE ARTHROPLASTY Right 03/19/2021   Procedure: RIGHT TOTAL KNEE ARTHROPLASTY;  Surgeon: Leandrew Koyanagi, MD;  Location: [place];  Service: Orthopedics;  Laterality: Right;   UTERINE FIBROID SURGERY     Social History   Occupational History   Not on file  Tobacco Use   Smoking status: Never   Smokeless tobacco: Never  Vaping Use   Vaping Use: Never used  Substance and Sexual Activity   Alcohol use: No   Drug use: No   Sexual activity: Not on file

## 2021-06-18 ENCOUNTER — Other Ambulatory Visit (HOSPITAL_COMMUNITY): Payer: Self-pay

## 2021-06-19 ENCOUNTER — Other Ambulatory Visit (HOSPITAL_COMMUNITY): Payer: Self-pay

## 2021-06-25 ENCOUNTER — Other Ambulatory Visit (HOSPITAL_COMMUNITY): Payer: Self-pay

## 2021-06-25 ENCOUNTER — Telehealth: Payer: Self-pay | Admitting: *Deleted

## 2021-06-25 NOTE — Telephone Encounter (Signed)
The request has been approved. The authorization is effective for a maximum of 12 fills from 06/25/2021 to 06/24/2022, as long as the member is enrolled in their current health plan. The request was approved with a quantity restriction. This has been approved for a max daily dosage of 1. A written notification letter will follow with additional details.

## 2021-06-25 NOTE — Telephone Encounter (Signed)
Submitted urgent PA [place] on CMM. Key: [id number]. PA Case ID: [id number]. Waiting on determination from [place].

## 2021-06-26 ENCOUNTER — Other Ambulatory Visit (HOSPITAL_COMMUNITY): Payer: Self-pay

## 2021-07-02 ENCOUNTER — Other Ambulatory Visit: Payer: Self-pay

## 2021-07-02 ENCOUNTER — Ambulatory Visit
Admission: RE | Admit: 2021-07-02 | Discharge: 2021-07-02 | Disposition: A | Discharge status: Home / Self Care | Payer: 59 | Source: Ambulatory Visit | Attending: Obstetrics & Gynecology | Admitting: Obstetrics & Gynecology

## 2021-07-02 DIAGNOSIS — Z1231 Encounter for screening mammogram for malignant neoplasm of breast: Secondary | ICD-10-CM | POA: Diagnosis not present

## 2021-07-12 ENCOUNTER — Other Ambulatory Visit (HOSPITAL_COMMUNITY): Payer: Self-pay

## 2021-07-12 DIAGNOSIS — E7800 Pure hypercholesterolemia, unspecified: Secondary | ICD-10-CM | POA: Diagnosis not present

## 2021-07-12 DIAGNOSIS — I10 Essential (primary) hypertension: Secondary | ICD-10-CM | POA: Diagnosis not present

## 2021-07-12 DIAGNOSIS — Z0000 Encounter for general adult medical examination without abnormal findings: Secondary | ICD-10-CM | POA: Diagnosis not present

## 2021-07-12 DIAGNOSIS — E785 Hyperlipidemia, unspecified: Secondary | ICD-10-CM | POA: Diagnosis not present

## 2021-07-12 DIAGNOSIS — Z1389 Encounter for screening for other disorder: Secondary | ICD-10-CM | POA: Diagnosis not present

## 2021-07-12 DIAGNOSIS — Z79899 Other long term (current) drug therapy: Secondary | ICD-10-CM | POA: Diagnosis not present

## 2021-07-12 DIAGNOSIS — G35 Multiple sclerosis: Secondary | ICD-10-CM | POA: Diagnosis not present

## 2021-07-12 DIAGNOSIS — E559 Vitamin D deficiency, unspecified: Secondary | ICD-10-CM | POA: Diagnosis not present

## 2021-07-12 MED ORDER — ATORVASTATIN CALCIUM 10 MG PO TABS
10.0000 mg | ORAL_TABLET | Freq: Every day | ORAL | 4 refills | Status: DC
  Filled 2021-07-12: qty 90, 90d supply, fill #0
  Filled 2021-11-02: qty 90, 90d supply, fill #1
  Filled 2022-02-04: qty 90, 90d supply, fill #2
  Filled 2022-04-25: qty 90, 90d supply, fill #3

## 2021-07-12 MED ORDER — LOSARTAN POTASSIUM 50 MG PO TABS
50.0000 mg | ORAL_TABLET | Freq: Every day | ORAL | 4 refills | Status: DC
  Filled 2021-07-12: qty 90, 90d supply, fill #0
  Filled 2021-11-02: qty 90, 90d supply, fill #1
  Filled 2022-02-04: qty 90, 90d supply, fill #2
  Filled 2022-04-25: qty 90, 90d supply, fill #3

## 2021-07-23 ENCOUNTER — Other Ambulatory Visit (HOSPITAL_COMMUNITY): Payer: Self-pay

## 2021-07-30 ENCOUNTER — Other Ambulatory Visit (HOSPITAL_COMMUNITY): Payer: Self-pay

## 2021-07-31 ENCOUNTER — Other Ambulatory Visit (HOSPITAL_COMMUNITY): Payer: Self-pay

## 2021-08-14 ENCOUNTER — Other Ambulatory Visit (HOSPITAL_COMMUNITY): Payer: Self-pay

## 2021-08-27 ENCOUNTER — Other Ambulatory Visit (HOSPITAL_COMMUNITY): Payer: Self-pay

## 2021-09-04 ENCOUNTER — Other Ambulatory Visit: Payer: Self-pay | Admitting: Pharmacist

## 2021-09-04 ENCOUNTER — Other Ambulatory Visit (HOSPITAL_COMMUNITY): Payer: Self-pay

## 2021-09-04 DIAGNOSIS — G35 Multiple sclerosis: Secondary | ICD-10-CM

## 2021-09-04 MED ORDER — GILENYA 0.5 MG PO CAPS
0.5000 mg | ORAL_CAPSULE | Freq: Every day | ORAL | 6 refills | Status: DC
  Filled 2021-09-04: qty 30, 30d supply, fill #0

## 2021-09-04 MED ORDER — FINGOLIMOD HCL 0.5 MG PO CAPS
ORAL_CAPSULE | ORAL | 6 refills | Status: DC
  Filled 2021-09-04: qty 30, 30d supply, fill #0
  Filled 2021-10-03: qty 30, 30d supply, fill #1
  Filled 2021-11-01: qty 30, 30d supply, fill #2
  Filled 2021-11-22: qty 30, 30d supply, fill #3
  Filled 2021-12-18: qty 30, 30d supply, fill #4
  Filled 2022-01-17: qty 30, 30d supply, fill #5
  Filled 2022-02-12: qty 30, 30d supply, fill #6

## 2021-09-05 ENCOUNTER — Other Ambulatory Visit (HOSPITAL_COMMUNITY): Payer: Self-pay

## 2021-09-12 ENCOUNTER — Ambulatory Visit (INDEPENDENT_AMBULATORY_CARE_PROVIDER_SITE_OTHER): Payer: 59

## 2021-09-12 ENCOUNTER — Ambulatory Visit (INDEPENDENT_AMBULATORY_CARE_PROVIDER_SITE_OTHER): Payer: 59 | Admitting: Orthopaedic Surgery

## 2021-09-12 ENCOUNTER — Other Ambulatory Visit: Payer: Self-pay

## 2021-09-12 DIAGNOSIS — Z96651 Presence of right artificial knee joint: Secondary | ICD-10-CM

## 2021-09-12 DIAGNOSIS — M1712 Unilateral primary osteoarthritis, left knee: Secondary | ICD-10-CM

## 2021-09-12 NOTE — Progress Notes (Signed)
° °  Office Visit Note   Patient: Jill Hall           Date of Birth: Jun 18, 1961           MRN: [id number] Visit Date: 09/12/2021              Requested by: Wenda Low, MD [address] [place],  [place] [address] PCP: Wenda Low, MD   Assessment & Plan: Visit Diagnoses:  1. Status post total right knee replacement     Plan: Jill Hall is 6 months status post right total knee replacement.  She is doing well has no complaints no pain.  Right knee shows a fully healed surgical scar.  Range of motion is 5 to 115 degrees.  Stable to varus valgus stress.  Jill Hall is doing very well from the knee replacement.  She has no functional limitations in regards to the right knee.  She is mainly limited by her left knee which she would like to have replaced around June of this year.  She will make an appointment for sometime in April to have repeat x-rays for the left knee for preoperative planning.  Dental prophylaxis reinforced.  We will check her right knee at that time as well with 2 view x-rays of the right knee.  Follow-Up Instructions: Return in about 6 months (around 03/12/2022) for and April 2023.   Orders:  Orders Placed This Encounter  Procedures   XR Knee 1-2 Views Right   No orders of the defined types were placed in this encounter.     Procedures: No procedures performed   Clinical Data: No additional findings.   Subjective: Chief Complaint  Patient presents with   Right Knee - Pain    HPI  Review of Systems   Objective: Vital Signs: LMP 01/03/2015   Physical Exam  Ortho Exam  Specialty Comments:  No specialty comments available.  Imaging: XR Knee 1-2 Views Right  Result Date: 09/12/2021 Stable total knee replacement in good alignment     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   Primary osteoarthritis of left knee 11/29/2020   Primary osteoarthritis  of right knee 06/23/2020   Vitamin D deficiency 01/29/2016   Optic neuritis 10/24/2014   Other fatigue 10/24/2014   High risk medication use 10/19/2014   Contraception management 08/05/2011   Multiple sclerosis ([place]) 08/26/1997   Past Medical History:  Diagnosis Date   Arthritis    both knees   Hypertension    Multiple sclerosis ([place])    Neuromuscular disorder ([place])    Multiple Sclerosis- affects her vision only    Family History  Problem Relation Age of Onset   Breast cancer Mother 30    Past Surgical History:  Procedure Laterality Date   CESAREAN SECTION     TOTAL KNEE ARTHROPLASTY Right 03/19/2021   Procedure: RIGHT TOTAL KNEE ARTHROPLASTY;  Surgeon: Leandrew Koyanagi, MD;  Location: [place];  Service: Orthopedics;  Laterality: Right;   UTERINE FIBROID SURGERY     Social History   Occupational History   Not on file  Tobacco Use   Smoking status: Never   Smokeless tobacco: Never  Vaping Use   Vaping Use: Never used  Substance and Sexual Activity   Alcohol use: No   Drug use: No   Sexual activity: Not on file

## 2021-09-24 ENCOUNTER — Other Ambulatory Visit (HOSPITAL_COMMUNITY): Payer: Self-pay

## 2021-10-03 ENCOUNTER — Other Ambulatory Visit (HOSPITAL_COMMUNITY): Payer: Self-pay

## 2021-10-19 ENCOUNTER — Other Ambulatory Visit (HOSPITAL_COMMUNITY): Payer: Self-pay

## 2021-10-22 ENCOUNTER — Other Ambulatory Visit (HOSPITAL_COMMUNITY): Payer: Self-pay

## 2021-11-01 ENCOUNTER — Other Ambulatory Visit (HOSPITAL_COMMUNITY): Payer: Self-pay

## 2021-11-02 ENCOUNTER — Other Ambulatory Visit (HOSPITAL_COMMUNITY): Payer: Self-pay

## 2021-11-22 ENCOUNTER — Other Ambulatory Visit (HOSPITAL_COMMUNITY): Payer: Self-pay

## 2021-11-27 ENCOUNTER — Other Ambulatory Visit (HOSPITAL_COMMUNITY): Payer: Self-pay

## 2021-11-30 IMAGING — MG DIGITAL SCREENING BILAT W/ TOMO W/ CAD
8 series · 8 of 24 positions shown · non-contrast
Comparison: Previous exam(s).

CLINICAL DATA: Screening.

EXAM:
DIGITAL SCREENING BILATERAL MAMMOGRAM WITH TOMO AND CAD

[R MLO synth-2D]
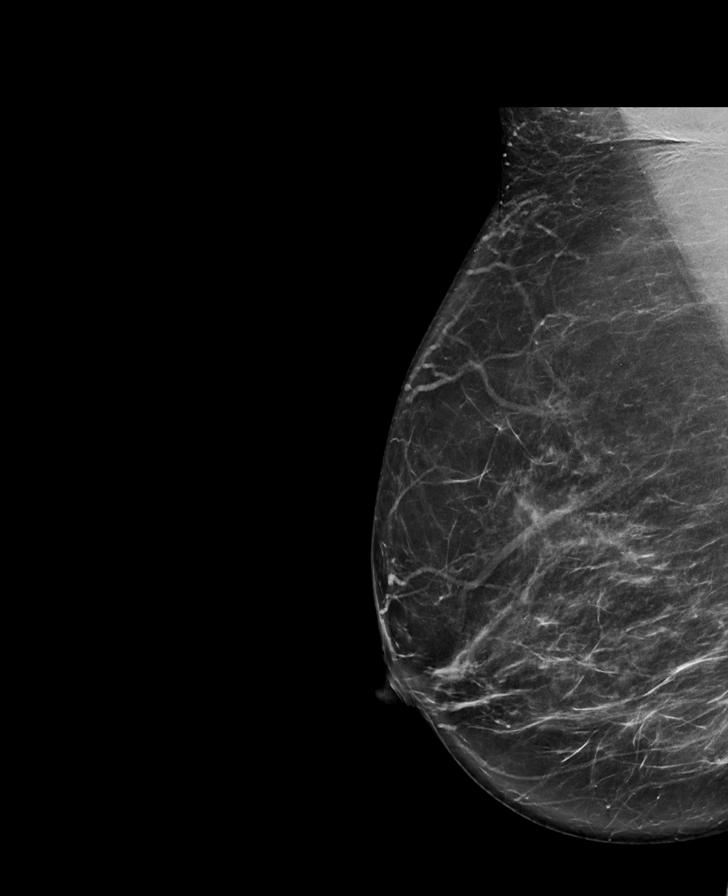

[L CC synth-2D]
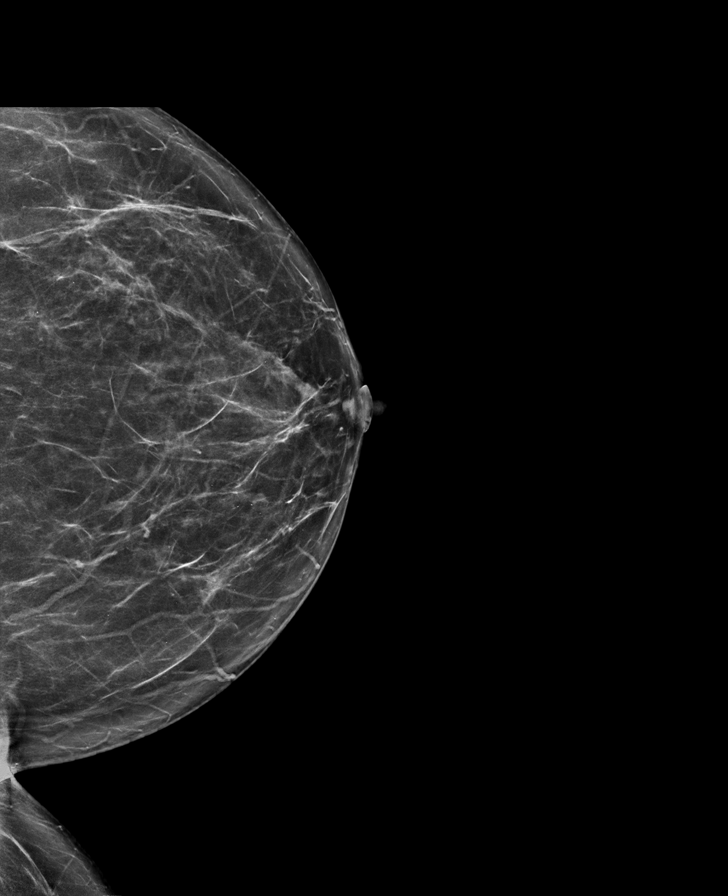

[R CC synth-2D]
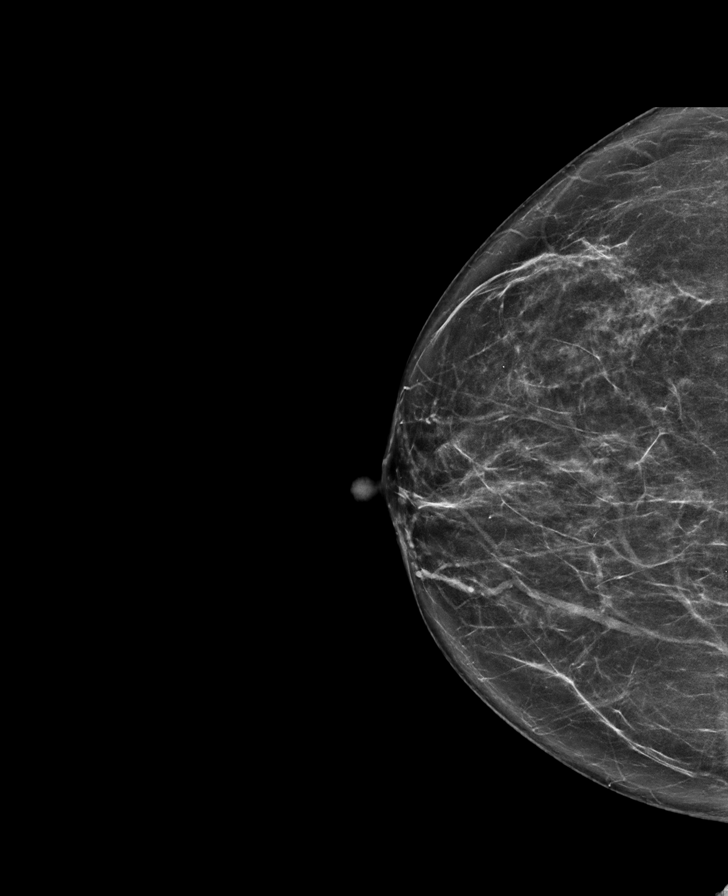

[L MLO synth-2D]
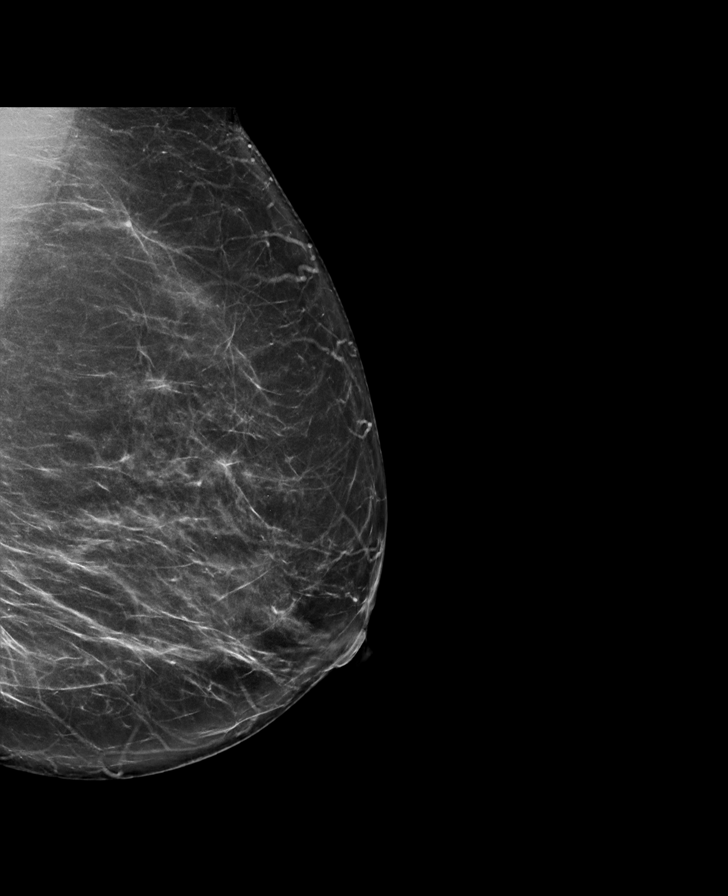

[L CC tomo · tomo slice 39/77.0]
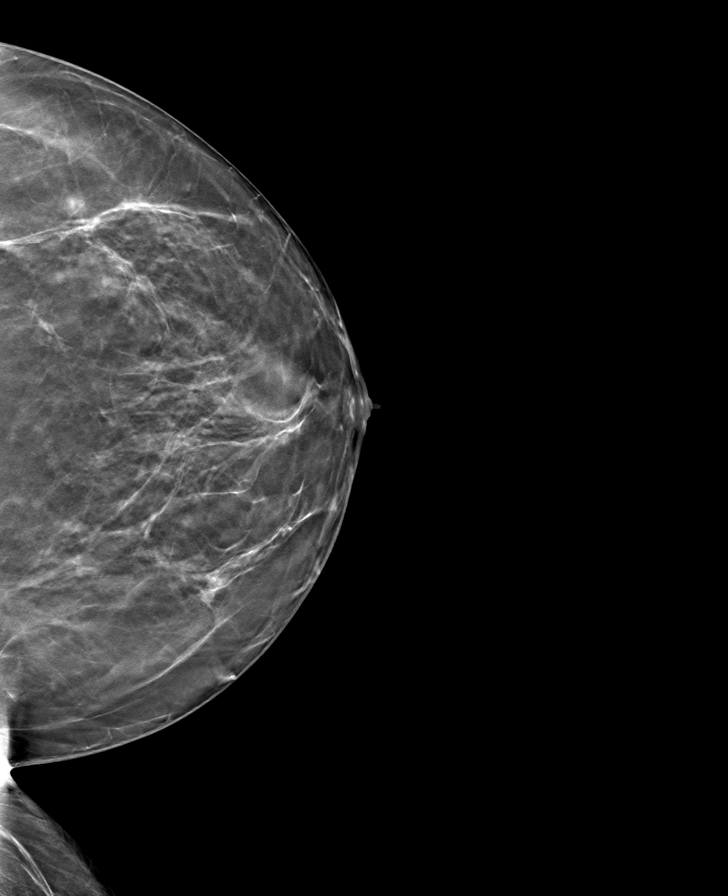

[L MLO tomo · tomo slice 43/85.0]
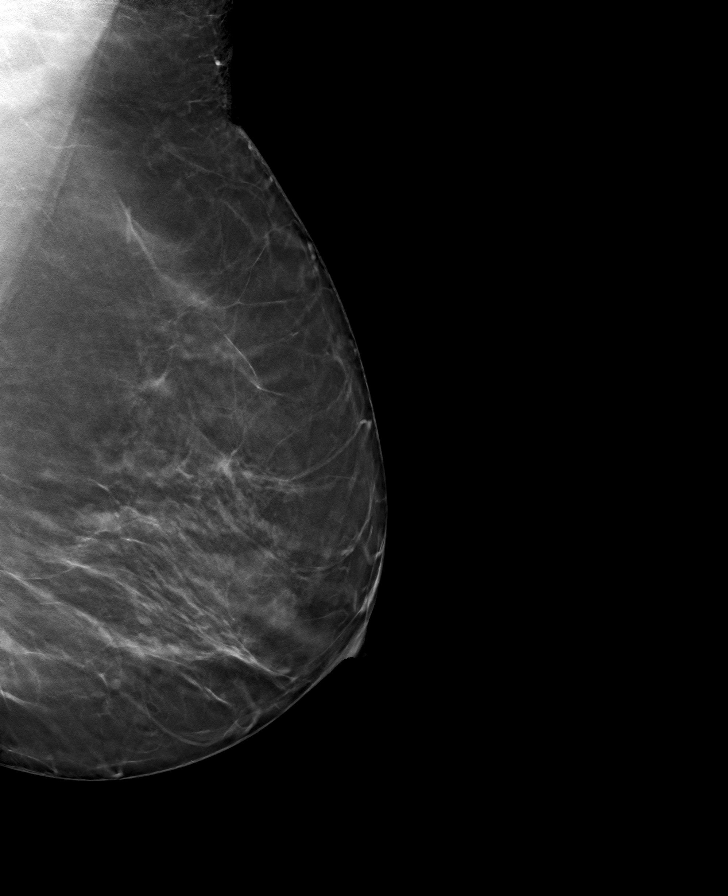

[R CC tomo · tomo slice 37/73.0]
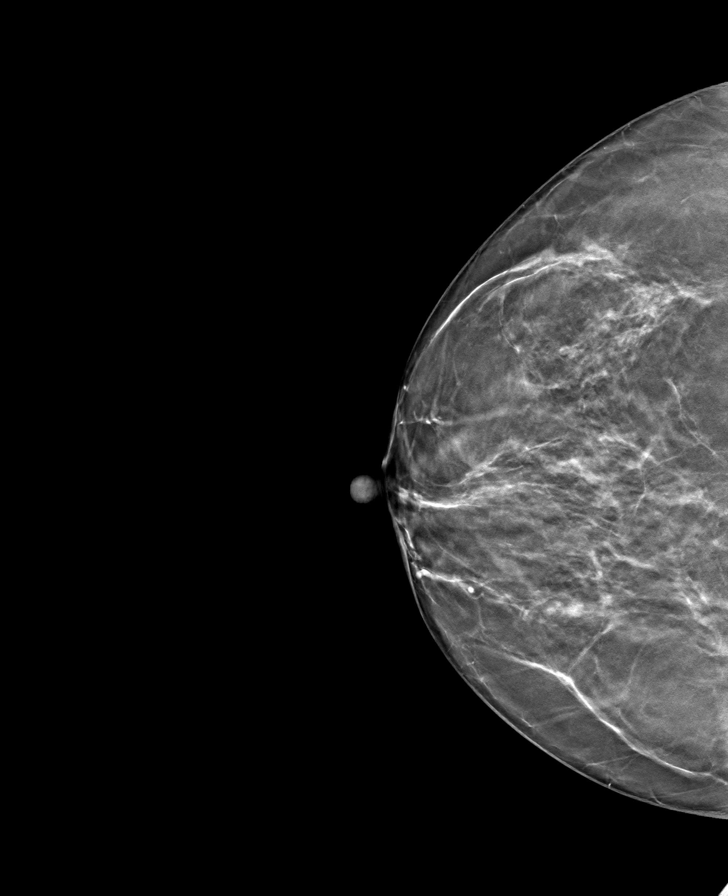

[R MLO tomo · tomo slice 42/83.0]
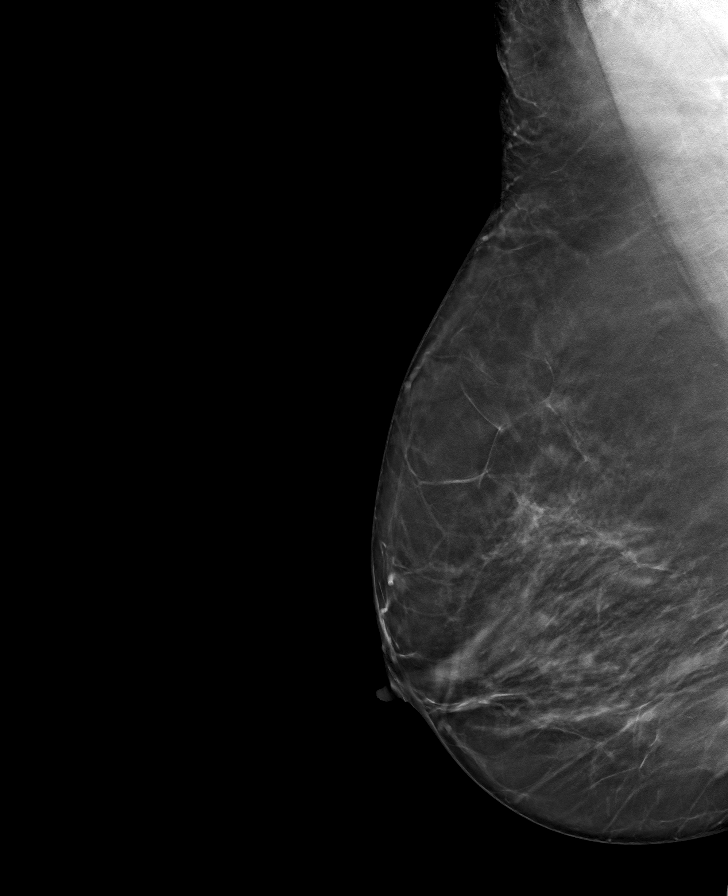

[8 of 24 positions shown; findings below may reference images not displayed]

ACR Breast Density Category c: The breast tissue is heterogeneously
dense, which may obscure small masses.
FINDINGS: In the right breast, a possible mass warrants further evaluation. In
the left breast, no findings suspicious for malignancy. Images were
processed with CAD.
IMPRESSION: Further evaluation is suggested for possible mass in the right
breast.

RECOMMENDATION:
Diagnostic mammogram and possibly ultrasound of the right breast.
(Code:YY-[id number])

The patient will be contacted regarding the findings, and additional
imaging will be scheduled.

BI-RADS CATEGORY  0: Incomplete. Need additional imaging evaluation
and/or prior mammograms for comparison.

## 2021-12-12 DIAGNOSIS — H5213 Myopia, bilateral: Secondary | ICD-10-CM | POA: Diagnosis not present

## 2021-12-13 ENCOUNTER — Other Ambulatory Visit (HOSPITAL_COMMUNITY): Payer: Self-pay

## 2021-12-18 ENCOUNTER — Other Ambulatory Visit (HOSPITAL_COMMUNITY): Payer: Self-pay

## 2021-12-25 ENCOUNTER — Other Ambulatory Visit (HOSPITAL_COMMUNITY): Payer: Self-pay

## 2022-01-17 ENCOUNTER — Other Ambulatory Visit (HOSPITAL_COMMUNITY): Payer: Self-pay

## 2022-01-24 ENCOUNTER — Other Ambulatory Visit (HOSPITAL_COMMUNITY): Payer: Self-pay

## 2022-02-04 ENCOUNTER — Other Ambulatory Visit (HOSPITAL_COMMUNITY): Payer: Self-pay

## 2022-02-12 ENCOUNTER — Other Ambulatory Visit (HOSPITAL_COMMUNITY): Payer: Self-pay

## 2022-02-19 ENCOUNTER — Other Ambulatory Visit (HOSPITAL_COMMUNITY): Payer: Self-pay

## 2022-03-07 ENCOUNTER — Ambulatory Visit (INDEPENDENT_AMBULATORY_CARE_PROVIDER_SITE_OTHER): Payer: 59 | Admitting: Neurology

## 2022-03-07 ENCOUNTER — Encounter: Payer: Self-pay | Admitting: Neurology

## 2022-03-07 VITALS — BP 131/80 | HR 79 | Ht 67.0 in | Wt 235.0 lb

## 2022-03-07 DIAGNOSIS — G35 Multiple sclerosis: Secondary | ICD-10-CM | POA: Diagnosis not present

## 2022-03-07 DIAGNOSIS — E559 Vitamin D deficiency, unspecified: Secondary | ICD-10-CM | POA: Diagnosis not present

## 2022-03-07 DIAGNOSIS — R5383 Other fatigue: Secondary | ICD-10-CM | POA: Diagnosis not present

## 2022-03-07 DIAGNOSIS — Z79899 Other long term (current) drug therapy: Secondary | ICD-10-CM | POA: Diagnosis not present

## 2022-03-07 NOTE — Progress Notes (Signed)
[facility]  PATIENT: Jill Hall DOB: 11/16/1960  REFERRING DOCTOR OR PCP:  Cammi Fulp SOURCE: patient  _________________________________   HISTORICAL  CHIEF COMPLAINT:  Chief Complaint  Patient presents with   Follow-up    Rm 2, alone. Here for 6 month f/u, on Gilenya and tolerating well. MS stable.     HISTORY OF PRESENT ILLNESS:  Jill Hall is a 61 y.o. woman with relapsing remittingmultiple sclerosis since 1999.   Update 03/07/2022: Her MS has been stable and she has no exacerbations. MRI last year showed no new lesions.    She has been on Gilenya long term and tolerates it well.    She has no new symptoms.   Gait, strength and sensation are doing well.   Balance is fine.   Her knee limits gait more than MS.  Her knee locks up.    She walks some as exercise daily.    She will exercise more once knee is better..   Bladder is fine.   Vision is doing well.  She denies much fatigue and does all planned tasks.   She sleeps well at night.    Mood and cognition are doing well.   Her daughter is at [employer] and is studyingChemistry and wants to be a Pharmacist, community.   .  She had her right TKR and will need left soon   She takes Vit D supplements  MS History:    She was diagnosed with MS in 1999 after presenting with optic neuritis.  MRI was consistent with MS and she was started on Betaseron. She did very well on Betaseron for many years.  She tolerated the medication well and had no significant skin issues. She was very compliant with the medication. For the past 15 years, she has had no definite exacerbation. However, on 06/09/2014, she underwent another MRI of the brain and it was  abnormal showing an enhancing focus in the right brachium pontis as well as her known chronic MS plaques. She started Gilenya in February 2016.    Imaging: MRI of the brain showed There are T2/FLAIR hyperintense foci in the right middle cerebellar peduncle and in the hemispheres in a  pattern and configuration consistent with chronic demyelinating plaque associated with multiple sclerosis.  None of the foci appear to be acute.  No new lesions compared to the 02/11/2018 MRI.  MRI of the brain 02/11/2018: T2/FLAIR hypertense foci in the right middle cerebellar peduncle and in the periventricular, juxtacortical and deep white matter of both hemispheres in a pattern and configuration consistent with chronic demyelinating plaque associated with multiple sclerosis.  None of the foci appears to be acute.   When compared to the MRI of the brain dated 08/01/2015, there is no interval change.  MRI of the brain 08/01/2015: No new or progressive lesions. Scattered foci of white matter signal consistent with the clinical diagnosis of multiple sclerosis.  Previously seen active lesion in the middle cerebellar peduncle on the right now appears chronic and inactive. No new lesions  MRI of the brain 06/09/2014,  was  abnormal showing an enhancing focus in the right brachium pontis as well as her known chronic MS plaques.   REVIEW OF SYSTEMS: Constitutional: No fevers, chills, sweats, or change in appetite.  Mild fatigue Eyes: No visual changes, double vision, eye pain Ear, nose and throat: No hearing loss, ear pain, nasal congestion, sore throat Cardiovascular: No chest pain, palpitations Respiratory:  No shortness of breath at rest  or with exertion.   No wheezes GastrointestinaI: No nausea, vomiting, diarrhea, abdominal pain, fecal incontinence Genitourinary:  No dysuria, urinary retention or frequency.  No nocturia.   Mild urgency at times Musculoskeletal:  No neck pain, back pain Integumentary: No rash, pruritus, skin lesions Neurological: as above Psychiatric: No depression at this time.  No anxiety Endocrine: No palpitations, diaphoresis, change in appetite, change in weigh or increased thirst Hematologic/Lymphatic:  No anemia, purpura, petechiae. Allergic/Immunologic: No itchy/runny eyes,  nasal congestion, recent allergic reactions, rashes  ALLERGIES: Allergies  Allergen Reactions   Pineapple Swelling    HOME MEDICATIONS:  Current Outpatient Medications:    amoxicillin (AMOXIL) 500 MG capsule, Take 4 capsules (2,000 mg total) by mouth once for 1 dose., Disp: 4 capsule, Rfl: 6   aspirin EC 81 MG tablet, Take 1 tablet (81 mg total) by mouth 2 (two) times daily., Disp: 84 tablet, Rfl: 0   atorvastatin (LIPITOR) 10 MG tablet, Take 1 tablet (10 mg total) by mouth daily., Disp: 90 tablet, Rfl: 4   Cholecalciferol 2000 UNITS TABS, Take 2,000 Units by mouth daily., Disp: , Rfl:    Ergocalciferol 2000 units TABS, Take 2,000 Units by mouth daily., Disp: 30 tablet, Rfl: 11   Fingolimod HCl (GILENYA) 0.5 MG CAPS, TAKE 1 CAPSULE (0.5 MG TOTAL) BY MOUTH DAILY., Disp: 30 capsule, Rfl: 6   losartan (COZAAR) 50 MG tablet, Take 1 tablet (50 mg total) by mouth daily., Disp: 90 tablet, Rfl: 4   Multiple Vitamin (MULTIVITAMIN WITH MINERALS) TABS, Take 1 tablet by mouth daily., Disp: , Rfl:   PAST MEDICAL HISTORY: Past Medical History:  Diagnosis Date   Arthritis    both knees   Hypertension    Multiple sclerosis ([place])    Neuromuscular disorder ([place])    Multiple Sclerosis- affects her vision only    PAST SURGICAL HISTORY: Past Surgical History:  Procedure Laterality Date   CESAREAN SECTION     TOTAL KNEE ARTHROPLASTY Right 03/19/2021   Procedure: RIGHT TOTAL KNEE ARTHROPLASTY;  Surgeon: Leandrew Koyanagi, MD;  Location: [facility];  Service: Orthopedics;  Laterality: Right;   UTERINE FIBROID SURGERY      FAMILY HISTORY: Family History  Problem Relation Age of Onset   Breast cancer Mother 54    SOCIAL HISTORY:  Social History   Socioeconomic History   Marital status: Married    Spouse name: Not on file   Number of children: Not on file   Years of education: Not on file   Highest education level: Not on file  Occupational History   Not on file  Tobacco Use   Smoking status:  Never   Smokeless tobacco: Never  Vaping Use   Vaping Use: Never used  Substance and Sexual Activity   Alcohol use: No   Drug use: No   Sexual activity: Not on file  Other Topics Concern   Not on file  Social History Narrative   Not on file   Social Determinants of Health   Financial Resource Strain: Not on file  Food Insecurity: Not on file  Transportation Needs: Not on file  Physical Activity: Not on file  Stress: Not on file  Social Connections: Not on file  Intimate Partner Violence: Not on file     PHYSICAL EXAM  Vitals:   03/07/22 0818  BP: 131/80  Pulse: 79  Weight: 235 lb (106.6 kg)  Height: '5\' 7"'$  (1.702 m)    Body mass index is 36.81 kg/m.   General:  The patient is well-developed and well-nourished and in no acute distress  Neurologic Exam  Mental status: The patient is alert and oriented x 3 at the time of the examination. The patient has apparent normal recent and remote memory, with an apparently normal attention span and concentration ability.   Speech is normal.  Cranial nerves: Extraocular movements are full.  Facial strength and sensation is normal.  Hearing seems symmetric.  Motor:  Muscle bulk is normal.   Muscle tone is normal. Strength is 5/5 in the arms and legs..   Sensory: Intact sensation to  soft touch and vibration sensation in all 4 extremities.  Coordination: Cerebellar testing shows good finger-nose-finger.  Gait and station: Station is normal.   The gait is arthritic.  Tandem gait is mildly wide.  The Romberg is negative..   Reflexes: Deep tendon reflexes are symmetric and normal bilaterally.       DIAGNOSTIC DATA (LABS, IMAGING, TESTING) - I reviewed patient records, labs, notes, testing and imaging myself where available.  Lab Results  Component Value Date   WBC 4.8 03/20/2021   HGB 10.8 (L) 03/20/2021   HCT 33.9 (L) 03/20/2021   MCV 86.0 03/20/2021   PLT 271 03/20/2021      Component Value Date/Time   NA 139  03/20/2021 0443   NA 141 03/01/2020 1050   K 3.6 03/20/2021 0443   CL 106 03/20/2021 0443   CO2 26 03/20/2021 0443   GLUCOSE 112 (H) 03/20/2021 0443   BUN 9 03/20/2021 0443   BUN 13 03/01/2020 1050   CREATININE 0.79 03/20/2021 0443   CALCIUM 8.9 03/20/2021 0443   PROT 7.3 03/14/2021 0824   PROT 7.5 03/01/2020 1050   ALBUMIN 3.8 03/14/2021 0824   ALBUMIN 4.5 03/01/2020 1050   AST 18 03/14/2021 0824   ALT 17 03/14/2021 0824   ALKPHOS 85 03/14/2021 0824   BILITOT 0.7 03/14/2021 0824   BILITOT 0.4 03/01/2020 1050   GFRNONAA >60 03/20/2021 0443   GFRAA 109 03/01/2020 1050     ASSESSMENT AND PLAN   1. Multiple sclerosis ([place])   2. High risk medication use   3. Other fatigue   4. Vitamin D deficiency       1.   Continue Gilenya every other day.  Will recheck labs today and she gets labs 1-2 times a year with PCP.  Will recheck MRI q3 years or so.    We discussed she had an active MRI in 2015 and benefit of DMT is likely higher than risk at this time - if > 10 years no activity, consider d/c DMT.   2.   Continue vitamin D supplements.  3.   Continue to stay active and exercises as tolerated. 4.   Return in about 12 months or sooner if there are new or worsening neurologic symptoms.   Lianah Peed A. Felecia Shelling, MD, PhD 3/55/9741, 6:38 AM Certified in Neurology, Clinical Neurophysiology, Sleep Medicine, Pain Medicine and Neuroimaging  [facility] [address] [place], [place] [address] [phone]

## 2022-03-08 LAB — COMPREHENSIVE METABOLIC PANEL
ALT: 8 IU/L (ref 0–32)
AST: 15 IU/L (ref 0–40)
Albumin/Globulin Ratio: 1.4 (ref 1.2–2.2)
Albumin: 4.3 g/dL (ref 3.9–4.9)
Alkaline Phosphatase: 102 IU/L (ref 44–121)
BUN/Creatinine Ratio: 15 (ref 12–28)
BUN: 10 mg/dL (ref 8–27)
Bilirubin Total: 0.4 mg/dL (ref 0.0–1.2)
CO2: 23 mmol/L (ref 20–29)
Calcium: 9.3 mg/dL (ref 8.7–10.3)
Chloride: 106 mmol/L (ref 96–106)
Creatinine, Ser: 0.67 mg/dL (ref 0.57–1.00)
Globulin, Total: 3.1 g/dL (ref 1.5–4.5)
Glucose: 80 mg/dL (ref 70–99)
Potassium: 4.2 mmol/L (ref 3.5–5.2)
Sodium: 142 mmol/L (ref 134–144)
Total Protein: 7.4 g/dL (ref 6.0–8.5)
eGFR: 99 mL/min/{1.73_m2} (ref >59)

## 2022-03-08 LAB — CBC WITH DIFFERENTIAL/PLATELET
Basophils Absolute: 0 10*3/uL (ref 0.0–0.2)
Basos: 1 %
EOS (ABSOLUTE): 0.1 10*3/uL (ref 0.0–0.4)
Eos: 4 %
Hematocrit: 37.3 % (ref 34.0–46.6)
Hemoglobin: 12 g/dL (ref 11.1–15.9)
Immature Grans (Abs): 0 10*3/uL (ref 0.0–0.1)
Immature Granulocytes: 1 %
Lymphocytes Absolute: 0.3 10*3/uL — ABNORMAL LOW (ref 0.7–3.1)
Lymphs: 10 %
MCH: 26.9 pg (ref 26.6–33.0)
MCHC: 32.2 g/dL (ref 31.5–35.7)
MCV: 84 fL (ref 79–97)
Monocytes Absolute: 0.3 10*3/uL (ref 0.1–0.9)
Monocytes: 11 %
Neutrophils Absolute: 2 10*3/uL (ref 1.4–7.0)
Neutrophils: 73 %
Platelets: 281 10*3/uL (ref 150–450)
RBC: 4.46 x10E6/uL (ref 3.77–5.28)
RDW: 15.7 % — ABNORMAL HIGH (ref 11.7–15.4)
WBC: 2.8 10*3/uL — ABNORMAL LOW (ref 3.4–10.8)

## 2022-03-08 LAB — VITAMIN D 25 HYDROXY (VIT D DEFICIENCY, FRACTURES): Vit D, 25-Hydroxy: 55.7 ng/mL (ref 30.0–100.0)

## 2022-03-12 ENCOUNTER — Other Ambulatory Visit (HOSPITAL_COMMUNITY): Payer: Self-pay

## 2022-03-14 ENCOUNTER — Ambulatory Visit: Payer: 59 | Attending: Internal Medicine | Admitting: Pharmacist

## 2022-03-14 DIAGNOSIS — Z79899 Other long term (current) drug therapy: Secondary | ICD-10-CM

## 2022-03-14 NOTE — Progress Notes (Signed)
   S: Patient presents today for review of their specialty medication.   Patient is currently taking Gilenya (fingolimod) for multiple sclerosis. Patient is managed by Dr. Felecia Shelling for this.   Adherence: confirms   Efficacy: reports that this mediation works well.  Dosing: Multiple sclerosis, relapsing: Oral: 0.5 mg once daily  Dosing: Renal Impairment: Adult  There are no dosage adjustments provided in the manufacturer's labeling; use with caution in severe renal impairment (exposure is increased). A small pharmacokinetic study in patients with stable severe impairment (CrCl <30 mL/minute) and not on dialysis suggests that increases in exposure to fingolimod and the active metabolite (fingolimod-P) are not clinically meaningful and dosage adjustment may not be necessary Shanon Brow 2015). Dosing: Hepatic Impairment: Adult  Hepatic impairment prior to treatment initiation: Mild to moderate impairment (Child-Pugh classes A and B): No dosage adjustment necessary. Severe impairment (Child-Pugh class C): There are no dosage adjustments provided in the manufacturer's labeling; use with caution and closely monitor; exposure is doubled in severe hepatic impairment. Hepatotoxicity during treatment:  ALT >3 times ULN and total bilirubin >2 times ULN: Interrupt treatment; permanently discontinue fingolimod if alternative etiology for hepatic injury cannot be identified due to risk of severe drug-induced liver injury.  Current adverse effects: none   Monitoring: S/sx of hepatotoxicity: denies   S/sx of infection: denies  S/sx of cardiovascular effects: denies  S/sx of respiratory effects: denies  S/sx of neurotoxicity: denies  S/sx of hypersensitivity: denies   O:   Lab Results  Component Value Date   WBC 2.8 (L) 03/07/2022   HGB 12.0 03/07/2022   HCT 37.3 03/07/2022   MCV 84 03/07/2022   PLT 281 03/07/2022      Chemistry      Component Value Date/Time   NA 142 03/07/2022 0902   K 4.2  03/07/2022 0902   CL 106 03/07/2022 0902   CO2 23 03/07/2022 0902   BUN 10 03/07/2022 0902   CREATININE 0.67 03/07/2022 0902      Component Value Date/Time   CALCIUM 9.3 03/07/2022 0902   ALKPHOS 102 03/07/2022 0902   AST 15 03/07/2022 0902   ALT 8 03/07/2022 0902   BILITOT 0.4 03/07/2022 0902       A/P: 1. Medication review: Patient currently prescribed Gilenya for multiple sclerosis and is tolerating it well. Reviewed the medication with the patient, including the following: Gilenya is a medication used in the treatment of multiple sclerosis. Administer with or without food. Possible adverse effects are GI upset, increased LFTs, increased risk of infection, headache, cardiovascular effects, hypersensitivity, lymphopenia, increased risk of malignancy, neurotoxicity, Progressive multifocal leukoencephalopathy, and respiratory effects. No recommendations for any changes.  Benard Halsted, PharmD, [place], [place] [phone]

## 2022-03-15 ENCOUNTER — Other Ambulatory Visit: Payer: Self-pay | Admitting: Neurology

## 2022-03-15 ENCOUNTER — Other Ambulatory Visit (HOSPITAL_COMMUNITY): Payer: Self-pay

## 2022-03-15 DIAGNOSIS — G35 Multiple sclerosis: Secondary | ICD-10-CM

## 2022-03-18 ENCOUNTER — Other Ambulatory Visit: Payer: Self-pay | Admitting: Pharmacist

## 2022-03-18 ENCOUNTER — Other Ambulatory Visit (HOSPITAL_COMMUNITY): Payer: Self-pay

## 2022-03-18 DIAGNOSIS — G35 Multiple sclerosis: Secondary | ICD-10-CM

## 2022-03-18 MED ORDER — FINGOLIMOD HCL 0.5 MG PO CAPS
1.0000 | ORAL_CAPSULE | ORAL | 11 refills | Status: DC
  Filled 2022-03-18: qty 15, 30d supply, fill #0
  Filled 2022-04-11: qty 15, 30d supply, fill #1
  Filled 2022-05-07: qty 15, 30d supply, fill #2
  Filled 2022-06-04: qty 15, 30d supply, fill #3
  Filled 2022-07-03 (×2): qty 15, 30d supply, fill #4
  Filled 2022-07-31: qty 15, 30d supply, fill #5
  Filled 2022-08-27: qty 15, 30d supply, fill #6
  Filled 2022-09-23: qty 15, 30d supply, fill #7
  Filled 2022-10-28: qty 15, 30d supply, fill #8
  Filled 2022-11-26: qty 15, 30d supply, fill #9
  Filled 2022-12-30: qty 15, 30d supply, fill #10
  Filled 2023-02-04: qty 15, 30d supply, fill #11

## 2022-03-18 MED ORDER — FINGOLIMOD HCL 0.5 MG PO CAPS
1.0000 | ORAL_CAPSULE | ORAL | 11 refills | Status: DC
  Filled 2022-03-18: qty 30, 60d supply, fill #0

## 2022-03-19 ENCOUNTER — Other Ambulatory Visit (HOSPITAL_COMMUNITY): Payer: Self-pay

## 2022-04-09 DIAGNOSIS — Z6837 Body mass index (BMI) 37.0-37.9, adult: Secondary | ICD-10-CM | POA: Diagnosis not present

## 2022-04-09 DIAGNOSIS — Z1151 Encounter for screening for human papillomavirus (HPV): Secondary | ICD-10-CM | POA: Diagnosis not present

## 2022-04-09 DIAGNOSIS — Z124 Encounter for screening for malignant neoplasm of cervix: Secondary | ICD-10-CM | POA: Diagnosis not present

## 2022-04-09 DIAGNOSIS — Z01419 Encounter for gynecological examination (general) (routine) without abnormal findings: Secondary | ICD-10-CM | POA: Diagnosis not present

## 2022-04-11 ENCOUNTER — Other Ambulatory Visit (HOSPITAL_COMMUNITY): Payer: Self-pay

## 2022-04-18 ENCOUNTER — Other Ambulatory Visit (HOSPITAL_COMMUNITY): Payer: Self-pay

## 2022-04-25 ENCOUNTER — Other Ambulatory Visit (HOSPITAL_COMMUNITY): Payer: Self-pay

## 2022-05-07 ENCOUNTER — Other Ambulatory Visit (HOSPITAL_COMMUNITY): Payer: Self-pay

## 2022-05-13 ENCOUNTER — Other Ambulatory Visit (HOSPITAL_COMMUNITY): Payer: Self-pay

## 2022-05-27 ENCOUNTER — Other Ambulatory Visit: Payer: Self-pay | Admitting: Internal Medicine

## 2022-05-27 DIAGNOSIS — Z1231 Encounter for screening mammogram for malignant neoplasm of breast: Secondary | ICD-10-CM

## 2022-06-04 ENCOUNTER — Other Ambulatory Visit (HOSPITAL_COMMUNITY): Payer: Self-pay

## 2022-06-12 ENCOUNTER — Other Ambulatory Visit (HOSPITAL_COMMUNITY): Payer: Self-pay

## 2022-06-13 ENCOUNTER — Other Ambulatory Visit (HOSPITAL_COMMUNITY): Payer: Self-pay

## 2022-06-14 ENCOUNTER — Other Ambulatory Visit (HOSPITAL_COMMUNITY): Payer: Self-pay

## 2022-06-21 ENCOUNTER — Other Ambulatory Visit (HOSPITAL_COMMUNITY): Payer: Self-pay

## 2022-06-21 ENCOUNTER — Other Ambulatory Visit: Payer: Self-pay

## 2022-06-21 ENCOUNTER — Telehealth: Payer: Self-pay | Admitting: Orthopaedic Surgery

## 2022-06-21 MED ORDER — AMOXICILLIN 500 MG PO CAPS
2000.0000 mg | ORAL_CAPSULE | Freq: Once | ORAL | 2 refills | Status: AC
  Filled 2022-06-21: qty 8, 2d supply, fill #0

## 2022-06-21 NOTE — Telephone Encounter (Signed)
Patient is dental work (Routine Cleaning) done on 10/31 in the morning she may need antibiotics sent in before that Rx needs to be sent to [facility] at hospital, dentist office should be calling Monday if needing Verbal Orders.

## 2022-06-21 NOTE — Telephone Encounter (Signed)
Sent in antibiotics to pharmacy and contacted patient.

## 2022-07-03 ENCOUNTER — Ambulatory Visit
Admission: RE | Admit: 2022-07-03 | Discharge: 2022-07-03 | Disposition: A | Discharge status: Home / Self Care | Payer: 59 | Source: Ambulatory Visit | Attending: Internal Medicine | Admitting: Internal Medicine

## 2022-07-03 ENCOUNTER — Other Ambulatory Visit (HOSPITAL_COMMUNITY): Payer: Self-pay

## 2022-07-03 DIAGNOSIS — Z1231 Encounter for screening mammogram for malignant neoplasm of breast: Secondary | ICD-10-CM | POA: Diagnosis not present

## 2022-07-03 MED ORDER — PEG 3350-KCL-NA BICARB-NACL 420 G PO SOLR
ORAL | 0 refills | Status: DC
  Filled 2022-07-03: qty 4000, 1d supply, fill #0

## 2022-07-05 ENCOUNTER — Other Ambulatory Visit: Payer: Self-pay | Admitting: Internal Medicine

## 2022-07-05 DIAGNOSIS — R928 Other abnormal and inconclusive findings on diagnostic imaging of breast: Secondary | ICD-10-CM

## 2022-07-09 DIAGNOSIS — K5730 Diverticulosis of large intestine without perforation or abscess without bleeding: Secondary | ICD-10-CM | POA: Diagnosis not present

## 2022-07-09 DIAGNOSIS — K648 Other hemorrhoids: Secondary | ICD-10-CM | POA: Diagnosis not present

## 2022-07-09 DIAGNOSIS — Z1211 Encounter for screening for malignant neoplasm of colon: Secondary | ICD-10-CM | POA: Diagnosis not present

## 2022-07-10 ENCOUNTER — Other Ambulatory Visit (HOSPITAL_COMMUNITY): Payer: Self-pay

## 2022-07-16 ENCOUNTER — Ambulatory Visit
Admission: RE | Admit: 2022-07-16 | Discharge: 2022-07-16 | Disposition: A | Discharge status: Home / Self Care | Payer: 59 | Source: Ambulatory Visit | Attending: Internal Medicine | Admitting: Internal Medicine

## 2022-07-16 DIAGNOSIS — R928 Other abnormal and inconclusive findings on diagnostic imaging of breast: Secondary | ICD-10-CM

## 2022-07-16 DIAGNOSIS — N6011 Diffuse cystic mastopathy of right breast: Secondary | ICD-10-CM | POA: Diagnosis not present

## 2022-07-16 DIAGNOSIS — R92323 Mammographic fibroglandular density, bilateral breasts: Secondary | ICD-10-CM | POA: Diagnosis not present

## 2022-07-16 DIAGNOSIS — N6012 Diffuse cystic mastopathy of left breast: Secondary | ICD-10-CM | POA: Diagnosis not present

## 2022-07-31 ENCOUNTER — Other Ambulatory Visit (HOSPITAL_COMMUNITY): Payer: Self-pay

## 2022-08-05 ENCOUNTER — Other Ambulatory Visit: Payer: Self-pay

## 2022-08-06 ENCOUNTER — Other Ambulatory Visit (HOSPITAL_COMMUNITY): Payer: Self-pay

## 2022-08-06 ENCOUNTER — Other Ambulatory Visit: Payer: Self-pay

## 2022-08-07 ENCOUNTER — Other Ambulatory Visit (HOSPITAL_COMMUNITY): Payer: Self-pay

## 2022-08-07 MED ORDER — ATORVASTATIN CALCIUM 10 MG PO TABS
10.0000 mg | ORAL_TABLET | Freq: Every day | ORAL | 4 refills | Status: DC
  Filled 2022-08-07: qty 90, 90d supply, fill #0
  Filled 2022-11-01: qty 90, 90d supply, fill #1
  Filled 2023-01-26: qty 90, 90d supply, fill #2
  Filled 2023-04-25: qty 90, 90d supply, fill #3
  Filled 2023-08-03: qty 90, 90d supply, fill #4

## 2022-08-07 MED ORDER — LOSARTAN POTASSIUM 50 MG PO TABS
50.0000 mg | ORAL_TABLET | Freq: Every day | ORAL | 4 refills | Status: DC
  Filled 2022-08-07: qty 90, 90d supply, fill #0
  Filled 2022-11-01: qty 90, 90d supply, fill #1
  Filled 2023-01-26: qty 90, 90d supply, fill #2
  Filled 2023-04-25: qty 90, 90d supply, fill #3
  Filled 2023-08-03: qty 90, 90d supply, fill #4

## 2022-08-08 DIAGNOSIS — Z6836 Body mass index (BMI) 36.0-36.9, adult: Secondary | ICD-10-CM | POA: Diagnosis not present

## 2022-08-08 DIAGNOSIS — E559 Vitamin D deficiency, unspecified: Secondary | ICD-10-CM | POA: Diagnosis not present

## 2022-08-08 DIAGNOSIS — G35 Multiple sclerosis: Secondary | ICD-10-CM | POA: Diagnosis not present

## 2022-08-08 DIAGNOSIS — E785 Hyperlipidemia, unspecified: Secondary | ICD-10-CM | POA: Diagnosis not present

## 2022-08-08 DIAGNOSIS — H469 Unspecified optic neuritis: Secondary | ICD-10-CM | POA: Diagnosis not present

## 2022-08-08 DIAGNOSIS — D72819 Decreased white blood cell count, unspecified: Secondary | ICD-10-CM | POA: Diagnosis not present

## 2022-08-08 DIAGNOSIS — I10 Essential (primary) hypertension: Secondary | ICD-10-CM | POA: Diagnosis not present

## 2022-08-08 DIAGNOSIS — Z0000 Encounter for general adult medical examination without abnormal findings: Secondary | ICD-10-CM | POA: Diagnosis not present

## 2022-08-27 ENCOUNTER — Other Ambulatory Visit (HOSPITAL_COMMUNITY): Payer: Self-pay

## 2022-08-28 ENCOUNTER — Other Ambulatory Visit (HOSPITAL_COMMUNITY): Payer: Self-pay

## 2022-08-30 ENCOUNTER — Other Ambulatory Visit: Payer: Self-pay

## 2022-08-30 ENCOUNTER — Other Ambulatory Visit (HOSPITAL_COMMUNITY): Payer: Self-pay

## 2022-09-02 ENCOUNTER — Other Ambulatory Visit: Payer: Self-pay

## 2022-09-02 ENCOUNTER — Other Ambulatory Visit (HOSPITAL_COMMUNITY): Payer: Self-pay

## 2022-09-23 ENCOUNTER — Other Ambulatory Visit (HOSPITAL_COMMUNITY): Payer: Self-pay

## 2022-09-26 ENCOUNTER — Telehealth: Payer: Self-pay | Admitting: Neurology

## 2022-09-26 NOTE — Telephone Encounter (Signed)
LVM and sent mychart msg informing pt of need to reschedule 03/10/23 appointment - MD out

## 2022-09-30 ENCOUNTER — Other Ambulatory Visit: Payer: Self-pay

## 2022-10-01 ENCOUNTER — Other Ambulatory Visit (HOSPITAL_COMMUNITY): Payer: Self-pay

## 2022-10-10 ENCOUNTER — Other Ambulatory Visit (HOSPITAL_COMMUNITY): Payer: Self-pay

## 2022-10-24 ENCOUNTER — Other Ambulatory Visit (HOSPITAL_COMMUNITY): Payer: Self-pay

## 2022-10-24 ENCOUNTER — Other Ambulatory Visit: Payer: Self-pay

## 2022-10-28 ENCOUNTER — Other Ambulatory Visit (HOSPITAL_COMMUNITY): Payer: Self-pay

## 2022-10-29 ENCOUNTER — Other Ambulatory Visit (HOSPITAL_COMMUNITY): Payer: Self-pay

## 2022-10-30 ENCOUNTER — Other Ambulatory Visit: Payer: Self-pay

## 2022-11-01 ENCOUNTER — Other Ambulatory Visit (HOSPITAL_COMMUNITY): Payer: Self-pay

## 2022-11-26 ENCOUNTER — Other Ambulatory Visit (HOSPITAL_COMMUNITY): Payer: Self-pay

## 2022-11-29 ENCOUNTER — Other Ambulatory Visit (HOSPITAL_COMMUNITY): Payer: Self-pay

## 2022-12-30 ENCOUNTER — Other Ambulatory Visit (HOSPITAL_COMMUNITY): Payer: Self-pay

## 2023-01-02 ENCOUNTER — Other Ambulatory Visit: Payer: Self-pay

## 2023-01-27 ENCOUNTER — Other Ambulatory Visit: Payer: Self-pay

## 2023-01-28 ENCOUNTER — Other Ambulatory Visit (HOSPITAL_COMMUNITY): Payer: Self-pay

## 2023-01-30 ENCOUNTER — Other Ambulatory Visit (HOSPITAL_COMMUNITY): Payer: Self-pay

## 2023-02-04 ENCOUNTER — Other Ambulatory Visit (HOSPITAL_COMMUNITY): Payer: Self-pay

## 2023-02-07 DIAGNOSIS — E7800 Pure hypercholesterolemia, unspecified: Secondary | ICD-10-CM | POA: Diagnosis not present

## 2023-02-07 DIAGNOSIS — G35 Multiple sclerosis: Secondary | ICD-10-CM | POA: Diagnosis not present

## 2023-02-07 DIAGNOSIS — Z6837 Body mass index (BMI) 37.0-37.9, adult: Secondary | ICD-10-CM | POA: Diagnosis not present

## 2023-02-07 DIAGNOSIS — I10 Essential (primary) hypertension: Secondary | ICD-10-CM | POA: Diagnosis not present

## 2023-02-10 ENCOUNTER — Other Ambulatory Visit (HOSPITAL_COMMUNITY): Payer: Self-pay

## 2023-03-05 ENCOUNTER — Telehealth: Payer: Self-pay | Admitting: Pharmacist

## 2023-03-05 ENCOUNTER — Ambulatory Visit: Payer: Commercial Managed Care - PPO | Attending: Internal Medicine | Admitting: Pharmacist

## 2023-03-05 DIAGNOSIS — Z79899 Other long term (current) drug therapy: Secondary | ICD-10-CM

## 2023-03-05 NOTE — Progress Notes (Signed)
   S: Patient presents today for review of their specialty medication.   Patient is currently taking Gilenya (fingolimod) for multiple sclerosis. Patient is managed by Dr. Epimenio Foot for this. Sees him next Wednesday for yearly checkup.   Adherence: confirms   Efficacy: reports that this mediation works well.  Dosing: Multiple sclerosis, relapsing: Oral: 0.5 mg once EVERY OTHER DAY.  Dosing: Renal Impairment: Adult  There are no dosage adjustments provided in the manufacturer's labeling; use with caution in severe renal impairment (exposure is increased). A small pharmacokinetic study in patients with stable severe impairment (CrCl <30 mL/minute) and not on dialysis suggests that increases in exposure to fingolimod and the active metabolite (fingolimod-P) are not clinically meaningful and dosage adjustment may not be necessary Onalee Hua 2015). Dosing: Hepatic Impairment: Adult  Hepatic impairment prior to treatment initiation: Mild to moderate impairment (Child-Pugh classes A and B): No dosage adjustment necessary. Severe impairment (Child-Pugh class C): There are no dosage adjustments provided in the manufacturer's labeling; use with caution and closely monitor; exposure is doubled in severe hepatic impairment. Hepatotoxicity during treatment:  ALT >3 times ULN and total bilirubin >2 times ULN: Interrupt treatment; permanently discontinue fingolimod if alternative etiology for hepatic injury cannot be identified due to risk of severe drug-induced liver injury.  Current adverse effects: none   Monitoring: S/sx of hepatotoxicity: denies   S/sx of infection: denies  S/sx of cardiovascular effects: denies  S/sx of respiratory effects: denies  S/sx of neurotoxicity: denies  S/sx of hypersensitivity: denies   O:   Lab Results  Component Value Date   WBC 2.8 (L) 03/07/2022   HGB 12.0 03/07/2022   HCT 37.3 03/07/2022   MCV 84 03/07/2022   PLT 281 03/07/2022      Chemistry      Component  Value Date/Time   NA 142 03/07/2022 0902   K 4.2 03/07/2022 0902   CL 106 03/07/2022 0902   CO2 23 03/07/2022 0902   BUN 10 03/07/2022 0902   CREATININE 0.67 03/07/2022 0902      Component Value Date/Time   CALCIUM 9.3 03/07/2022 0902   ALKPHOS 102 03/07/2022 0902   AST 15 03/07/2022 0902   ALT 8 03/07/2022 0902   BILITOT 0.4 03/07/2022 0902       A/P: 1. Medication review: Patient currently prescribed Gilenya for multiple sclerosis and is tolerating it well. Reviewed the medication with the patient, including the following: Gilenya is a medication used in the treatment of multiple sclerosis. Administer with or without food. Possible adverse effects are GI upset, increased LFTs, increased risk of infection, headache, cardiovascular effects, hypersensitivity, lymphopenia, increased risk of malignancy, neurotoxicity, Progressive multifocal leukoencephalopathy, and respiratory effects. No recommendations for any changes.  Butch Penny, PharmD, [place]y [place], CPP Clinical Pharmacist [facility] [phone]

## 2023-03-05 NOTE — Telephone Encounter (Signed)
Called patient to schedule an appointment for the [employer] [facility]. I was unable to reach the patient so I left a HIPAA-compliant message requesting that the patient return my call.   Luke Van Ausdall, PharmD, BCACP, CPP Clinical Pharmacist [facility] [phone]  

## 2023-03-07 ENCOUNTER — Other Ambulatory Visit (HOSPITAL_COMMUNITY): Payer: Self-pay

## 2023-03-07 ENCOUNTER — Other Ambulatory Visit: Payer: Self-pay | Admitting: Neurology

## 2023-03-07 DIAGNOSIS — G35 Multiple sclerosis: Secondary | ICD-10-CM

## 2023-03-08 ENCOUNTER — Emergency Department (HOSPITAL_BASED_OUTPATIENT_CLINIC_OR_DEPARTMENT_OTHER)
Admission: EM | Admit: 2023-03-08 | Discharge: 2023-03-08 | Disposition: A | Discharge status: Other Acute Inpatient Hospital | Payer: Commercial Managed Care - PPO | Attending: Emergency Medicine | Admitting: Emergency Medicine

## 2023-03-08 ENCOUNTER — Encounter (HOSPITAL_BASED_OUTPATIENT_CLINIC_OR_DEPARTMENT_OTHER): Payer: Self-pay

## 2023-03-08 DIAGNOSIS — T22311A Burn of third degree of right forearm, initial encounter: Secondary | ICD-10-CM | POA: Diagnosis not present

## 2023-03-08 DIAGNOSIS — Z23 Encounter for immunization: Secondary | ICD-10-CM | POA: Insufficient documentation

## 2023-03-08 DIAGNOSIS — Z7982 Long term (current) use of aspirin: Secondary | ICD-10-CM | POA: Diagnosis not present

## 2023-03-08 DIAGNOSIS — X102XXA Contact with fats and cooking oils, initial encounter: Secondary | ICD-10-CM | POA: Insufficient documentation

## 2023-03-08 DIAGNOSIS — T22231A Burn of second degree of right upper arm, initial encounter: Secondary | ICD-10-CM | POA: Insufficient documentation

## 2023-03-08 DIAGNOSIS — Y93G3 Activity, cooking and baking: Secondary | ICD-10-CM | POA: Insufficient documentation

## 2023-03-08 DIAGNOSIS — T2016XA Burn of first degree of forehead and cheek, initial encounter: Secondary | ICD-10-CM | POA: Diagnosis not present

## 2023-03-08 DIAGNOSIS — T22211A Burn of second degree of right forearm, initial encounter: Secondary | ICD-10-CM | POA: Diagnosis not present

## 2023-03-08 DIAGNOSIS — T2010XA Burn of first degree of head, face, and neck, unspecified site, initial encounter: Secondary | ICD-10-CM

## 2023-03-08 DIAGNOSIS — T300 Burn of unspecified body region, unspecified degree: Secondary | ICD-10-CM | POA: Diagnosis not present

## 2023-03-08 DIAGNOSIS — T22232A Burn of second degree of left upper arm, initial encounter: Secondary | ICD-10-CM | POA: Diagnosis not present

## 2023-03-08 DIAGNOSIS — T2020XA Burn of second degree of head, face, and neck, unspecified site, initial encounter: Secondary | ICD-10-CM | POA: Diagnosis not present

## 2023-03-08 DIAGNOSIS — T310 Burns involving less than 10% of body surface: Secondary | ICD-10-CM | POA: Diagnosis not present

## 2023-03-08 DIAGNOSIS — S59911A Unspecified injury of right forearm, initial encounter: Secondary | ICD-10-CM | POA: Diagnosis present

## 2023-03-08 DIAGNOSIS — T2029XA Burn of second degree of multiple sites of head, face, and neck, initial encounter: Secondary | ICD-10-CM | POA: Diagnosis not present

## 2023-03-08 MED ORDER — SODIUM CHLORIDE 0.9 % IV BOLUS
1000.0000 mL | Freq: Once | INTRAVENOUS | Status: AC
  Administered 2023-03-08: 1000 mL via INTRAVENOUS

## 2023-03-08 MED ORDER — TETANUS-DIPHTH-ACELL PERTUSSIS 5-2.5-18.5 LF-MCG/0.5 IM SUSY
0.5000 mL | PREFILLED_SYRINGE | Freq: Once | INTRAMUSCULAR | Status: AC
  Administered 2023-03-08: 0.5 mL via INTRAMUSCULAR
  Filled 2023-03-08: qty 0.5

## 2023-03-08 MED ORDER — SODIUM CHLORIDE 0.9 % IV BOLUS: 1000.0000 mL | Freq: Once | INTRAVENOUS | Status: DC

## 2023-03-08 MED ORDER — SILVER SULFADIAZINE 1 % EX CREA
TOPICAL_CREAM | Freq: Once | CUTANEOUS | Status: AC
  Filled 2023-03-08: qty 85
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
## 2023-03-08 NOTE — ED Notes (Signed)
Pt resting and denies pain. Able to tolerate dressing application. Call light in reach and not other needs at this time.
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
## 2023-03-09 DIAGNOSIS — T300 Burn of unspecified body region, unspecified degree: Secondary | ICD-10-CM | POA: Diagnosis not present

## 2023-03-10 ENCOUNTER — Other Ambulatory Visit (HOSPITAL_COMMUNITY): Payer: Self-pay

## 2023-03-10 ENCOUNTER — Ambulatory Visit: Payer: 59 | Admitting: Neurology

## 2023-03-10 ENCOUNTER — Other Ambulatory Visit: Payer: Self-pay | Admitting: Neurology

## 2023-03-10 DIAGNOSIS — G35 Multiple sclerosis: Secondary | ICD-10-CM

## 2023-03-10 MED ORDER — OXYCODONE HCL 5 MG PO TABS
5.0000 mg | ORAL_TABLET | Freq: Four times a day (QID) | ORAL | 0 refills | Status: DC | PRN
Start: 2023-03-09 — End: 2024-03-16
  Filled 2023-03-10: qty 20, 5d supply, fill #0

## 2023-03-11 ENCOUNTER — Encounter: Payer: Self-pay | Admitting: Neurology

## 2023-03-11 ENCOUNTER — Other Ambulatory Visit: Payer: Self-pay

## 2023-03-11 ENCOUNTER — Other Ambulatory Visit (HOSPITAL_COMMUNITY): Payer: Self-pay

## 2023-03-11 ENCOUNTER — Ambulatory Visit (INDEPENDENT_AMBULATORY_CARE_PROVIDER_SITE_OTHER): Payer: Commercial Managed Care - PPO | Admitting: Neurology

## 2023-03-11 VITALS — BP 153/87 | HR 78 | Ht 67.0 in | Wt 239.5 lb

## 2023-03-11 DIAGNOSIS — M25569 Pain in unspecified knee: Secondary | ICD-10-CM

## 2023-03-11 DIAGNOSIS — G8929 Other chronic pain: Secondary | ICD-10-CM | POA: Diagnosis not present

## 2023-03-11 DIAGNOSIS — G35 Multiple sclerosis: Secondary | ICD-10-CM | POA: Diagnosis not present

## 2023-03-11 DIAGNOSIS — R5383 Other fatigue: Secondary | ICD-10-CM | POA: Diagnosis not present

## 2023-03-11 DIAGNOSIS — Z79899 Other long term (current) drug therapy: Secondary | ICD-10-CM

## 2023-03-11 MED ORDER — FINGOLIMOD HCL 0.5 MG PO CAPS
1.0000 | ORAL_CAPSULE | ORAL | 11 refills | Status: DC
Start: 2023-03-11 — End: 2024-02-25
  Filled 2023-03-11: qty 15, 30d supply, fill #0
  Filled 2023-04-03: qty 15, 30d supply, fill #1
  Filled 2023-04-30: qty 15, 30d supply, fill #2
  Filled 2023-06-03: qty 15, 30d supply, fill #3
  Filled 2023-07-07: qty 15, 30d supply, fill #4
  Filled 2023-08-01: qty 15, 30d supply, fill #5
  Filled 2023-08-25: qty 15, 30d supply, fill #6
  Filled 2023-10-06: qty 15, 30d supply, fill #7
  Filled 2023-10-31 – 2023-11-04 (×3): qty 15, 30d supply, fill #8
  Filled 2023-12-03: qty 15, 30d supply, fill #9
  Filled 2024-01-02: qty 15, 30d supply, fill #10
  Filled 2024-01-28: qty 15, 30d supply, fill #11
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
## 2023-03-11 NOTE — Telephone Encounter (Signed)
Pt has an Appointment Today 03/11/2023

## 2023-04-02 ENCOUNTER — Other Ambulatory Visit (HOSPITAL_COMMUNITY): Payer: Self-pay

## 2023-04-03 ENCOUNTER — Other Ambulatory Visit (HOSPITAL_COMMUNITY): Payer: Self-pay

## 2023-04-04 ENCOUNTER — Other Ambulatory Visit (HOSPITAL_COMMUNITY): Payer: Self-pay

## 2023-04-10 ENCOUNTER — Other Ambulatory Visit (HOSPITAL_COMMUNITY): Payer: Self-pay

## 2023-04-11 DIAGNOSIS — N841 Polyp of cervix uteri: Secondary | ICD-10-CM | POA: Diagnosis not present

## 2023-04-11 DIAGNOSIS — Z01419 Encounter for gynecological examination (general) (routine) without abnormal findings: Secondary | ICD-10-CM | POA: Diagnosis not present

## 2023-04-11 DIAGNOSIS — Z6837 Body mass index (BMI) 37.0-37.9, adult: Secondary | ICD-10-CM | POA: Diagnosis not present

## 2023-04-15 DIAGNOSIS — L299 Pruritus, unspecified: Secondary | ICD-10-CM | POA: Diagnosis not present

## 2023-04-15 DIAGNOSIS — T310 Burns involving less than 10% of body surface: Secondary | ICD-10-CM | POA: Diagnosis not present

## 2023-04-26 ENCOUNTER — Other Ambulatory Visit (HOSPITAL_COMMUNITY): Payer: Self-pay

## 2023-04-30 ENCOUNTER — Other Ambulatory Visit (HOSPITAL_COMMUNITY): Payer: Self-pay

## 2023-05-15 ENCOUNTER — Other Ambulatory Visit: Payer: Self-pay

## 2023-05-17 ENCOUNTER — Encounter (HOSPITAL_COMMUNITY): Payer: Self-pay

## 2023-05-19 ENCOUNTER — Other Ambulatory Visit: Payer: Self-pay

## 2023-05-28 ENCOUNTER — Other Ambulatory Visit: Payer: Self-pay

## 2023-06-03 ENCOUNTER — Other Ambulatory Visit: Payer: Self-pay

## 2023-06-03 NOTE — Progress Notes (Signed)
Specialty Pharmacy Refill Coordination Note  Jill Hall is a 62 y.o. female contacted today regarding refills of specialty medication(s) Fingolimod Hcl   Patient requested Delivery   Delivery date: 06/09/23   Verified address: [address] [place], [place], [address]   Medication will be filled on 06/06/2023.

## 2023-06-18 ENCOUNTER — Other Ambulatory Visit: Payer: Self-pay

## 2023-07-01 ENCOUNTER — Other Ambulatory Visit: Payer: Self-pay

## 2023-07-02 ENCOUNTER — Other Ambulatory Visit: Payer: Self-pay

## 2023-07-04 ENCOUNTER — Other Ambulatory Visit: Payer: Self-pay

## 2023-07-07 ENCOUNTER — Other Ambulatory Visit: Payer: Self-pay

## 2023-07-07 NOTE — Progress Notes (Signed)
Specialty Pharmacy Refill Coordination Note  Jill Hall is a 62 y.o. female contacted today regarding refills of specialty medication(s) Fingolimod Hcl   Patient requested Delivery   Delivery date: 07/10/23   Verified address: [address] [place], [place], [address]   Medication will be filled on 07/09/23.

## 2023-07-09 ENCOUNTER — Other Ambulatory Visit: Payer: Self-pay

## 2023-07-09 ENCOUNTER — Other Ambulatory Visit (HOSPITAL_COMMUNITY): Payer: Self-pay

## 2023-07-09 ENCOUNTER — Telehealth: Payer: Self-pay

## 2023-07-09 NOTE — Telephone Encounter (Signed)
Pharmacy Patient Advocate Encounter   Received notification from Patient Pharmacy that prior authorization for Fingolimod HCl 0.5MG  capsules is required/requested.   Insurance verification completed.   The patient is insured through [facility] .   Per test claim: PA required; PA submitted to above mentioned insurance via CoverMyMeds Key/confirmation #/EOC [id number] Status is pending

## 2023-07-10 ENCOUNTER — Other Ambulatory Visit: Payer: Self-pay

## 2023-07-11 ENCOUNTER — Other Ambulatory Visit (HOSPITAL_COMMUNITY): Payer: Self-pay

## 2023-07-11 ENCOUNTER — Other Ambulatory Visit: Payer: Self-pay

## 2023-07-11 DIAGNOSIS — Z1389 Encounter for screening for other disorder: Secondary | ICD-10-CM | POA: Diagnosis not present

## 2023-07-11 DIAGNOSIS — G35 Multiple sclerosis: Secondary | ICD-10-CM | POA: Diagnosis not present

## 2023-07-11 DIAGNOSIS — D72819 Decreased white blood cell count, unspecified: Secondary | ICD-10-CM | POA: Diagnosis not present

## 2023-07-11 DIAGNOSIS — Z0000 Encounter for general adult medical examination without abnormal findings: Secondary | ICD-10-CM | POA: Diagnosis not present

## 2023-07-11 DIAGNOSIS — I10 Essential (primary) hypertension: Secondary | ICD-10-CM | POA: Diagnosis not present

## 2023-07-11 DIAGNOSIS — E559 Vitamin D deficiency, unspecified: Secondary | ICD-10-CM | POA: Diagnosis not present

## 2023-07-11 DIAGNOSIS — Z6837 Body mass index (BMI) 37.0-37.9, adult: Secondary | ICD-10-CM | POA: Diagnosis not present

## 2023-07-11 DIAGNOSIS — E7800 Pure hypercholesterolemia, unspecified: Secondary | ICD-10-CM | POA: Diagnosis not present

## 2023-07-11 DIAGNOSIS — R7303 Prediabetes: Secondary | ICD-10-CM | POA: Diagnosis not present

## 2023-07-11 DIAGNOSIS — Z13220 Encounter for screening for lipoid disorders: Secondary | ICD-10-CM | POA: Diagnosis not present

## 2023-07-11 MED ORDER — ATORVASTATIN CALCIUM 10 MG PO TABS
10.0000 mg | ORAL_TABLET | Freq: Every day | ORAL | 5 refills | Status: DC
  Filled 2023-07-11 – 2023-07-14 (×2): qty 90, 90d supply, fill #0

## 2023-07-11 MED ORDER — LOSARTAN POTASSIUM 50 MG PO TABS
50.0000 mg | ORAL_TABLET | Freq: Every day | ORAL | 5 refills | Status: DC
  Filled 2023-07-11 – 2023-07-14 (×2): qty 90, 90d supply, fill #0

## 2023-07-14 ENCOUNTER — Other Ambulatory Visit: Payer: Self-pay

## 2023-07-14 ENCOUNTER — Other Ambulatory Visit (HOSPITAL_COMMUNITY): Payer: Self-pay

## 2023-07-16 ENCOUNTER — Other Ambulatory Visit (HOSPITAL_COMMUNITY): Payer: Self-pay

## 2023-07-16 NOTE — Telephone Encounter (Signed)
Pharmacy Patient Advocate Encounter  Received notification from [facility] that Prior Authorization for Fingolimod HCl 0.5MG  capsules has been APPROVED from 07/11/2023 to 07/09/2024   PA #/Case ID/Reference #:  [id number]  Pharmacy notified.

## 2023-07-30 ENCOUNTER — Other Ambulatory Visit: Payer: Self-pay

## 2023-08-01 ENCOUNTER — Other Ambulatory Visit: Payer: Self-pay

## 2023-08-01 ENCOUNTER — Encounter (HOSPITAL_COMMUNITY): Payer: Self-pay

## 2023-08-01 NOTE — Progress Notes (Signed)
Specialty Pharmacy Refill Coordination Note  Jill Hall is a 62 y.o. female contacted today regarding refills of specialty medication(s) Fingolimod Hcl   Patient requested Delivery   Delivery date: 08/06/23   Verified address: [address]   Medication will be filled on 08/05/23.

## 2023-08-01 NOTE — Progress Notes (Signed)
Specialty Pharmacy Ongoing Clinical Assessment Note  Jill Hall is a 62 y.o. female who is being followed by the specialty pharmacy service for RxSp Multiple Sclerosis   Patient's specialty medication(s) reviewed today: Fingolimod Hcl   Missed doses in the last 4 weeks: 0   Patient/Caregiver did not have any additional questions or concerns.   Therapeutic benefit summary: Patient is achieving benefit   Adverse events/side effects summary: No adverse events/side effects   Patient's therapy is appropriate to: Continue    Goals Addressed             This Visit's Progress    Stabilization of disease       Patient is on track. Patient will maintain adherence         Follow up:  6 months  Bobette Mo Specialty Pharmacist

## 2023-08-04 ENCOUNTER — Other Ambulatory Visit (HOSPITAL_COMMUNITY): Payer: Self-pay

## 2023-08-04 ENCOUNTER — Other Ambulatory Visit: Payer: Self-pay

## 2023-08-05 ENCOUNTER — Other Ambulatory Visit (HOSPITAL_COMMUNITY): Payer: Self-pay

## 2023-08-05 ENCOUNTER — Other Ambulatory Visit: Payer: Self-pay

## 2023-08-12 ENCOUNTER — Other Ambulatory Visit: Payer: Self-pay | Admitting: Internal Medicine

## 2023-08-12 DIAGNOSIS — Z0000 Encounter for general adult medical examination without abnormal findings: Secondary | ICD-10-CM

## 2023-08-14 ENCOUNTER — Ambulatory Visit
Admission: RE | Admit: 2023-08-14 | Discharge: 2023-08-14 | Disposition: A | Discharge status: Home / Self Care | Payer: Commercial Managed Care - PPO | Source: Ambulatory Visit | Attending: Internal Medicine | Admitting: Internal Medicine

## 2023-08-14 DIAGNOSIS — Z0000 Encounter for general adult medical examination without abnormal findings: Secondary | ICD-10-CM

## 2023-08-14 DIAGNOSIS — Z1231 Encounter for screening mammogram for malignant neoplasm of breast: Secondary | ICD-10-CM | POA: Diagnosis not present

## 2023-08-25 ENCOUNTER — Other Ambulatory Visit (HOSPITAL_COMMUNITY): Payer: Self-pay | Admitting: Pharmacy Technician

## 2023-08-25 ENCOUNTER — Other Ambulatory Visit (HOSPITAL_COMMUNITY): Payer: Self-pay

## 2023-08-25 NOTE — Progress Notes (Signed)
Specialty Pharmacy Refill Coordination Note  Jill Hall is a 62 y.o. female contacted today regarding refills of specialty medication(s) Fingolimod HCl   Patient requested Delivery   Delivery date: 09/01/23   Verified address: Patient address [address]   [place]   Medication will be filled on 08/29/23.

## 2023-08-29 ENCOUNTER — Other Ambulatory Visit: Payer: Self-pay

## 2023-08-29 ENCOUNTER — Other Ambulatory Visit (HOSPITAL_COMMUNITY): Payer: Self-pay

## 2023-09-23 ENCOUNTER — Other Ambulatory Visit: Payer: Self-pay

## 2023-09-30 ENCOUNTER — Other Ambulatory Visit: Payer: Self-pay

## 2023-10-06 ENCOUNTER — Other Ambulatory Visit: Payer: Self-pay

## 2023-10-06 NOTE — Progress Notes (Signed)
 Specialty Pharmacy Refill Coordination Note  Jill Hall is a 63 y.o. female contacted today regarding refills of specialty medication(s) Fingolimod  HCl   Patient requested Delivery   Delivery date: 10/10/23   Verified address: Patient address [address]  [name]   Medication will be filled on 10/09/23.

## 2023-10-07 ENCOUNTER — Other Ambulatory Visit: Payer: Self-pay

## 2023-10-09 ENCOUNTER — Other Ambulatory Visit: Payer: Self-pay

## 2023-10-09 ENCOUNTER — Other Ambulatory Visit (HOSPITAL_COMMUNITY): Payer: Self-pay

## 2023-10-31 ENCOUNTER — Other Ambulatory Visit (HOSPITAL_COMMUNITY): Payer: Self-pay

## 2023-10-31 ENCOUNTER — Other Ambulatory Visit: Payer: Self-pay

## 2023-10-31 NOTE — Progress Notes (Signed)
 Specialty Pharmacy Refill Coordination Note  Jill Hall is a 63 y.o. female contacted today regarding refills of specialty medication(s) Fingolimod HCl   Patient requested Delivery   Delivery date: 11/05/23   Verified address: Patient address [address]  [place]   Medication will be filled on 03.11.25.

## 2023-11-03 ENCOUNTER — Encounter (HOSPITAL_COMMUNITY): Payer: Self-pay

## 2023-11-03 ENCOUNTER — Other Ambulatory Visit (HOSPITAL_COMMUNITY): Payer: Self-pay

## 2023-11-04 ENCOUNTER — Other Ambulatory Visit: Payer: Self-pay

## 2023-11-04 ENCOUNTER — Other Ambulatory Visit (HOSPITAL_COMMUNITY): Payer: Self-pay

## 2023-11-04 MED ORDER — ATORVASTATIN CALCIUM 10 MG PO TABS
10.0000 mg | ORAL_TABLET | Freq: Every day | ORAL | 5 refills | Status: AC
  Filled 2023-11-04: qty 90, 90d supply, fill #0
  Filled 2024-01-30: qty 90, 90d supply, fill #1
  Filled 2024-04-30: qty 90, 90d supply, fill #2

## 2023-11-04 MED ORDER — LOSARTAN POTASSIUM 50 MG PO TABS
50.0000 mg | ORAL_TABLET | Freq: Every day | ORAL | 5 refills | Status: AC
  Filled 2023-11-04: qty 90, 90d supply, fill #0
  Filled 2024-01-30: qty 90, 90d supply, fill #1
  Filled 2024-04-30: qty 90, 90d supply, fill #2

## 2023-11-05 ENCOUNTER — Other Ambulatory Visit: Payer: Self-pay

## 2023-11-06 ENCOUNTER — Other Ambulatory Visit (HOSPITAL_COMMUNITY): Payer: Self-pay

## 2023-11-06 ENCOUNTER — Other Ambulatory Visit: Payer: Self-pay

## 2023-11-26 ENCOUNTER — Other Ambulatory Visit: Payer: Self-pay

## 2023-12-01 ENCOUNTER — Other Ambulatory Visit (HOSPITAL_COMMUNITY): Payer: Self-pay

## 2023-12-03 ENCOUNTER — Other Ambulatory Visit: Payer: Self-pay | Admitting: Pharmacy Technician

## 2023-12-03 ENCOUNTER — Other Ambulatory Visit: Payer: Self-pay

## 2023-12-03 NOTE — Progress Notes (Signed)
 Specialty Pharmacy Refill Coordination Note  Jill Hall is a 63 y.o. female contacted today regarding refills of specialty medication(s) Fingolimod HCl   Patient requested Delivery   Delivery date: 12/08/23   Verified address: [address]  [place]   Medication will be filled on 12/05/23.

## 2023-12-17 DIAGNOSIS — H5213 Myopia, bilateral: Secondary | ICD-10-CM | POA: Diagnosis not present

## 2023-12-29 ENCOUNTER — Other Ambulatory Visit: Payer: Self-pay

## 2024-01-02 ENCOUNTER — Other Ambulatory Visit: Payer: Self-pay

## 2024-01-02 NOTE — Progress Notes (Signed)
 Specialty Pharmacy Refill Coordination Note  Jill Hall is a 63 y.o. female contacted today regarding refills of specialty medication(s) Fingolimod  HCl   Patient requested Delivery   Delivery date: 01/07/24   Verified address: [address]   [place] [id number]   Medication will be filled on 01/06/24.

## 2024-01-08 DIAGNOSIS — R7303 Prediabetes: Secondary | ICD-10-CM | POA: Diagnosis not present

## 2024-01-08 DIAGNOSIS — Z6836 Body mass index (BMI) 36.0-36.9, adult: Secondary | ICD-10-CM | POA: Diagnosis not present

## 2024-01-08 DIAGNOSIS — E559 Vitamin D deficiency, unspecified: Secondary | ICD-10-CM | POA: Diagnosis not present

## 2024-01-08 DIAGNOSIS — I10 Essential (primary) hypertension: Secondary | ICD-10-CM | POA: Diagnosis not present

## 2024-01-08 DIAGNOSIS — E785 Hyperlipidemia, unspecified: Secondary | ICD-10-CM | POA: Diagnosis not present

## 2024-01-08 DIAGNOSIS — G35 Multiple sclerosis: Secondary | ICD-10-CM | POA: Diagnosis not present

## 2024-01-28 ENCOUNTER — Other Ambulatory Visit: Payer: Self-pay

## 2024-01-28 NOTE — Progress Notes (Signed)
 Specialty Pharmacy Refill Coordination Note  Jill Hall is a 63 y.o. female contacted today regarding refills of specialty medication(s) Fingolimod  HCl   Patient requested Delivery   Delivery date: 02/03/24   Verified address: [address]   [place] [address]   Medication will be filled on 02/02/24.

## 2024-01-28 NOTE — Progress Notes (Signed)
 Specialty Pharmacy Ongoing Clinical Assessment Note  Jill Hall is a 63 y.o. female who is being followed by the specialty pharmacy service for RxSp Multiple Sclerosis   Patient's specialty medication(s) reviewed today: Fingolimod  HCl   Missed doses in the last 4 weeks: 0   Patient/Caregiver did not have any additional questions or concerns.   Therapeutic benefit summary: Patient is achieving benefit   Adverse events/side effects summary: No adverse events/side effects   Patient's therapy is appropriate to: Continue    Goals Addressed             This Visit's Progress    Stabilization of disease   On track    Patient is on track. Patient will maintain adherence, be monitored by provider to determine if a change in treatment plan is warranted, and be evaluated at upcoming provider appointment to assess progress. At office visit on 03/11/23, disease was considered stable as there were no new lesions on previous year's MRI. Dr. Godwin Lat considering tapering her off Gilenyna or changing over to [facility] in 2025 if MRI continues to show no lesions.         Follow up: 12 months  [facility]

## 2024-01-30 ENCOUNTER — Other Ambulatory Visit (HOSPITAL_COMMUNITY): Payer: Self-pay

## 2024-02-02 ENCOUNTER — Other Ambulatory Visit: Payer: Self-pay

## 2024-02-02 ENCOUNTER — Other Ambulatory Visit (HOSPITAL_COMMUNITY): Payer: Self-pay

## 2024-02-23 ENCOUNTER — Other Ambulatory Visit (HOSPITAL_COMMUNITY): Payer: Self-pay

## 2024-02-25 ENCOUNTER — Other Ambulatory Visit (HOSPITAL_COMMUNITY): Payer: Self-pay

## 2024-02-25 ENCOUNTER — Other Ambulatory Visit: Payer: Self-pay

## 2024-02-25 ENCOUNTER — Other Ambulatory Visit: Payer: Self-pay | Admitting: Neurology

## 2024-02-25 DIAGNOSIS — G35 Multiple sclerosis: Secondary | ICD-10-CM

## 2024-02-25 MED ORDER — FINGOLIMOD HCL 0.5 MG PO CAPS
1.0000 | ORAL_CAPSULE | ORAL | 0 refills | Status: AC
Start: 2024-02-25 — End: ?
  Filled 2024-02-25: qty 15, 30d supply, fill #0

## 2024-02-25 NOTE — Progress Notes (Signed)
 Specialty Pharmacy Refill Coordination Note  Spoke with   Rik Males Day (Self)    Males Day Nakamura is a 63 y.o. female contacted today regarding refills of specialty medication(s) Fingolimod  HCl  Patient requested: Delivery   Delivery date: 03/02/24   Verified address: [address]  [place] [address]  Medication will be filled on 03/01/24.  This fill date is pending response to refill request from provider. Patient is aware and if they have not received fill by intended date, they must follow up with pharmacy.

## 2024-02-25 NOTE — Telephone Encounter (Signed)
 Last seen on 03/11/23 Follow up scheduled on 03/10/24

## 2024-02-26 ENCOUNTER — Other Ambulatory Visit (HOSPITAL_COMMUNITY): Payer: Self-pay

## 2024-03-10 ENCOUNTER — Ambulatory Visit: Payer: Commercial Managed Care - PPO | Admitting: Neurology

## 2024-03-15 NOTE — Progress Notes (Signed)
 [facility]  PATIENT: Jill Hall DOB: Jan 01, 1961  REFERRING DOCTOR OR PCP:  Cammi Fulp SOURCE: patient  _________________________________   HISTORICAL  CHIEF COMPLAINT:  Chief Complaint  Patient presents with   Follow-up    RM 3, Pt alone. Here for 1 year f/u. Pt states she is doing well.     HISTORY OF PRESENT ILLNESS:  Jill Hall is a 63 y.o. woman with relapsing remittingmultiple sclerosis since 1999.   Update 03/16/2024: Returns for yearly follow-up visit unaccompanied.  Prior visit with Dr. Vear 03/09/2023. Her MS has been stable and she has no exacerbations. MRI 2022 showed no new lesions.    She has been on Gilenya  long term and tolerates it well.    She has no new symptoms and overall doing very well.  She reports her gait, strength and sensation are doing well.  No recent falls.  Balance is fine, though she uses the bannister on stairs for safety  She walks some as exercise daily.  She is looking to having left knee replacement, gait is impaired some more due to left knee pain.   Bladder is fine.   Vision is doing well.  She denies much fatigue and does all planned tasks.  Maintains ADLs and IADLs independently.  She sleeps well at night.      Mood and cognition are doing well.    She takes Vit D supplements    MS History:    She was diagnosed with MS in 1999 after presenting with optic neuritis.  MRI was consistent with MS and she was started on Betaseron. She did very well on Betaseron for many years.  She tolerated the medication well and had no significant skin issues. She was very compliant with the medication. For the past 15 years, she has had no definite exacerbation. However, on 06/09/2014, she underwent another MRI of the brain and it was  abnormal showing an enhancing focus in the right brachium pontis as well as her known chronic MS plaques. She started Gilenya  in February 2016.    Imaging: MRI of the brain showed There are  T2/FLAIR hyperintense foci in the right middle cerebellar peduncle and in the hemispheres in a pattern and configuration consistent with chronic demyelinating plaque associated with multiple sclerosis.  None of the foci appear to be acute.  No new lesions compared to the 02/11/2018 MRI.  MRI of the brain 02/11/2018: T2/FLAIR hypertense foci in the right middle cerebellar peduncle and in the periventricular, juxtacortical and deep white matter of both hemispheres in a pattern and configuration consistent with chronic demyelinating plaque associated with multiple sclerosis.  None of the foci appears to be acute.   When compared to the MRI of the brain dated 08/01/2015, there is no interval change.  MRI of the brain 08/01/2015: No new or progressive lesions. Scattered foci of white matter signal consistent with the clinical diagnosis of multiple sclerosis.  Previously seen active lesion in the middle cerebellar peduncle on the right now appears chronic and inactive. No new lesions  MRI of the brain 06/09/2014,  was  abnormal showing an enhancing focus in the right brachium pontis as well as her known chronic MS plaques.   REVIEW OF SYSTEMS: Constitutional: No fevers, chills, sweats, or change in appetite.  Mild fatigue Eyes: No visual changes, double vision, eye pain Ear, nose and throat: No hearing loss, ear pain, nasal congestion, sore throat Cardiovascular: No chest pain, palpitations Respiratory:  No shortness of breath  at rest or with exertion.   No wheezes GastrointestinaI: No nausea, vomiting, diarrhea, abdominal pain, fecal incontinence Genitourinary:  No dysuria, urinary retention or frequency.  No nocturia.   Mild urgency at times Musculoskeletal:  No neck pain, back pain Integumentary: No rash, pruritus, skin lesions Neurological: as above Psychiatric: No depression at this time.  No anxiety Endocrine: No palpitations, diaphoresis, change in appetite, change in weigh or increased  thirst Hematologic/Lymphatic:  No anemia, purpura, petechiae. Allergic/Immunologic: No itchy/runny eyes, nasal congestion, recent allergic reactions, rashes  ALLERGIES: Allergies  Allergen Reactions   Pineapple Swelling    HOME MEDICATIONS:  Current Outpatient Medications:    atorvastatin  (LIPITOR) 10 MG tablet, Take 1 tablet (10 mg total) by mouth daily., Disp: 90 tablet, Rfl: 5   Cholecalciferol 2000 UNITS TABS, Take 2,000 Units by mouth daily., Disp: , Rfl:    Fingolimod  HCl (GILENYA ) 0.5 MG CAPS, Take 1 capsule (0.5 mg total) by mouth every other day., Disp: 15 capsule, Rfl: 0   losartan  (COZAAR ) 50 MG tablet, Take 1 tablet (50 mg total) by mouth daily., Disp: 90 tablet, Rfl: 5   Multiple Vitamin (MULTIVITAMIN WITH MINERALS) TABS, Take 1 tablet by mouth daily., Disp: , Rfl:   PAST MEDICAL HISTORY: Past Medical History:  Diagnosis Date   Arthritis    both knees   Hypertension    Multiple sclerosis (HCC)    Neuromuscular disorder (HCC)    Multiple Sclerosis- affects her vision only    PAST SURGICAL HISTORY: Past Surgical History:  Procedure Laterality Date   CESAREAN SECTION     TOTAL KNEE ARTHROPLASTY Right 03/19/2021   Procedure: RIGHT TOTAL KNEE ARTHROPLASTY;  Surgeon: Jerri Kay HERO, MD;  Location: [facility] OR;  Service: Orthopedics;  Laterality: Right;   UTERINE FIBROID SURGERY      FAMILY HISTORY: Family History  Problem Relation Age of Onset   Breast cancer Mother 33    SOCIAL HISTORY:  Social History   Socioeconomic History   Marital status: Married    Spouse name: Not on file   Number of children: Not on file   Years of education: Not on file   Highest education level: Not on file  Occupational History   Not on file  Tobacco Use   Smoking status: Never   Smokeless tobacco: Never  Vaping Use   Vaping status: Never Used  Substance and Sexual Activity   Alcohol use: No   Drug use: No   Sexual activity: Not on file  Other Topics Concern   Not on file   Social History Narrative   Not on file   Social Drivers of Health   Financial Resource Strain: Not on file  Food Insecurity: Not on file  Transportation Needs: Not on file  Physical Activity: Not on file  Stress: Not on file  Social Connections: Not on file  Intimate Partner Violence: Not on file     PHYSICAL EXAM  Vitals:   03/16/24 0747  BP: (!) 146/84  Pulse: 72  Weight: 236 lb 12.8 oz (107.4 kg)  Height: 5' 7 (1.702 m)   Body mass index is 37.09 kg/m.  General: The patient is well-developed and well-nourished and in no acute distress  Neurologic Exam  Mental status: The patient is alert and oriented x 3 at the time of the examination. The patient has apparent normal recent and remote memory, with an apparently normal attention span and concentration ability.   Speech is normal.  Cranial nerves: Extraocular movements are  full.  Facial strength and sensation is normal.  Hearing seems symmetric.  Motor:  Muscle bulk is normal.   Muscle tone is normal. Strength is 5/5 in the arms and legs..   Sensory: Intact sensation to  soft touch and vibration sensation in all 4 extremities.  Coordination: Cerebellar testing shows good finger-nose-finger.  Gait and station: Station is normal.   The gait is arthritic (left knee), bowing out of LLE.  The tandem gait is mildly wide  Reflexes: Deep tendon reflexes are symmetric and normal bilaterally.       DIAGNOSTIC DATA (LABS, IMAGING, TESTING) - I reviewed patient records, labs, notes, testing and imaging myself where available.  Lab Results  Component Value Date   WBC 2.8 (L) 03/07/2022   HGB 12.0 03/07/2022   HCT 37.3 03/07/2022   MCV 84 03/07/2022   PLT 281 03/07/2022      Component Value Date/Time   NA 142 03/07/2022 0902   K 4.2 03/07/2022 0902   CL 106 03/07/2022 0902   CO2 23 03/07/2022 0902   GLUCOSE 80 03/07/2022 0902   GLUCOSE 112 (H) 03/20/2021 0443   BUN 10 03/07/2022 0902   CREATININE 0.67  03/07/2022 0902   CALCIUM  9.3 03/07/2022 0902   PROT 7.4 03/07/2022 0902   ALBUMIN 4.3 03/07/2022 0902   AST 15 03/07/2022 0902   ALT 8 03/07/2022 0902   ALKPHOS 102 03/07/2022 0902   BILITOT 0.4 03/07/2022 0902   GFRNONAA >60 03/20/2021 0443   GFRAA 109 03/01/2020 1050     ASSESSMENT AND PLAN   1. Multiple sclerosis (HCC)   2. High risk medication use   3. Vitamin D  deficiency       1.   Continue Gilenya  every other day (due to lymphocyte counts).  Will repeat lab work today. Will repeat MRI brain, if no new lesions, consider tapering the Gilenya  off or switching to [place] as a final treatment 2.   Continue vitamin D  supplements. Repeat lab work today.  3.   Continue to stay active and exercises as tolerated. 4.   Return in about 12 months or sooner if there are new or worsening neurologic symptoms.        I personally spent a total of 25 minutes in the care of the patient today including preparing to see the patient, performing a medically appropriate exam/evaluation, counseling and educating, placing orders, and documenting clinical information in the EHR. This is our first time meeting and time has been spent reviewing past medical history and relevant medical records.  Harlene Bogaert, AGNP-BC  [facility] [address] [place], [place] [address]  Phone [phone] Fax [phone] Note: This document was prepared with digital dictation and possible smart phrase technology. Any transcriptional errors that result from this process are unintentional.

## 2024-03-16 ENCOUNTER — Ambulatory Visit (INDEPENDENT_AMBULATORY_CARE_PROVIDER_SITE_OTHER): Admitting: Adult Health

## 2024-03-16 ENCOUNTER — Encounter: Payer: Self-pay | Admitting: Adult Health

## 2024-03-16 VITALS — BP 146/84 | HR 72 | Ht 67.0 in | Wt 236.8 lb

## 2024-03-16 DIAGNOSIS — G35 Multiple sclerosis: Secondary | ICD-10-CM | POA: Diagnosis not present

## 2024-03-16 DIAGNOSIS — Z79899 Other long term (current) drug therapy: Secondary | ICD-10-CM

## 2024-03-16 DIAGNOSIS — E559 Vitamin D deficiency, unspecified: Secondary | ICD-10-CM | POA: Diagnosis not present

## 2024-03-16 NOTE — Patient Instructions (Addendum)
 Your Plan:  Continue Gilenya  every other day  We will check lab work today  You will be called to schedule repeat MRI brain      Follow-up in 1 year with Dr. Vear or call earlier if needed      Thank you for coming to see us  at [facility]. I hope we have been able to provide you high quality care today.  You may receive a patient satisfaction survey over the next few weeks. We would appreciate your feedback and comments so that we may continue to improve ourselves and the health of our patients.

## 2024-03-17 ENCOUNTER — Ambulatory Visit: Payer: Self-pay | Admitting: Adult Health

## 2024-03-17 LAB — CBC WITH DIFFERENTIAL/PLATELET
Basophils Absolute: 0 x10E3/uL (ref 0.0–0.2)
Basos: 0 %
EOS (ABSOLUTE): 0.2 x10E3/uL (ref 0.0–0.4)
Eos: 7 %
Hematocrit: 37.8 % (ref 34.0–46.6)
Hemoglobin: 12.2 g/dL (ref 11.1–15.9)
Immature Grans (Abs): 0 x10E3/uL (ref 0.0–0.1)
Immature Granulocytes: 0 %
Lymphocytes Absolute: 0.3 x10E3/uL — ABNORMAL LOW (ref 0.7–3.1)
Lymphs: 11 %
MCH: 27.5 pg (ref 26.6–33.0)
MCHC: 32.3 g/dL (ref 31.5–35.7)
MCV: 85 fL (ref 79–97)
Monocytes Absolute: 0.3 x10E3/uL (ref 0.1–0.9)
Monocytes: 11 %
Neutrophils Absolute: 2 x10E3/uL (ref 1.4–7.0)
Neutrophils: 70 %
Platelets: 280 x10E3/uL (ref 150–450)
RBC: 4.43 x10E6/uL (ref 3.77–5.28)
RDW: 15.7 % — ABNORMAL HIGH (ref 11.7–15.4)
WBC: 2.9 x10E3/uL — ABNORMAL LOW (ref 3.4–10.8)

## 2024-03-17 LAB — COMPREHENSIVE METABOLIC PANEL WITH GFR
ALT: 16 IU/L (ref 0–32)
AST: 19 IU/L (ref 0–40)
Albumin: 4.4 g/dL (ref 3.9–4.9)
Alkaline Phosphatase: 108 IU/L (ref 44–121)
BUN/Creatinine Ratio: 20 (ref 12–28)
BUN: 14 mg/dL (ref 8–27)
Bilirubin Total: 0.4 mg/dL (ref 0.0–1.2)
CO2: 21 mmol/L (ref 20–29)
Calcium: 9.3 mg/dL (ref 8.7–10.3)
Chloride: 106 mmol/L (ref 96–106)
Creatinine, Ser: 0.71 mg/dL (ref 0.57–1.00)
Globulin, Total: 2.8 g/dL (ref 1.5–4.5)
Glucose: 93 mg/dL (ref 70–99)
Potassium: 4.2 mmol/L (ref 3.5–5.2)
Sodium: 144 mmol/L (ref 134–144)
Total Protein: 7.2 g/dL (ref 6.0–8.5)
eGFR: 95 mL/min/1.73 (ref >59)

## 2024-03-17 LAB — VITAMIN D 25 HYDROXY (VIT D DEFICIENCY, FRACTURES): Vit D, 25-Hydroxy: 44.9 ng/mL (ref 30.0–100.0)

## 2024-03-24 ENCOUNTER — Ambulatory Visit (INDEPENDENT_AMBULATORY_CARE_PROVIDER_SITE_OTHER)

## 2024-03-24 DIAGNOSIS — G35 Multiple sclerosis: Secondary | ICD-10-CM | POA: Diagnosis not present

## 2024-03-24 MED ORDER — GADOBENATE DIMEGLUMINE 529 MG/ML IV SOLN
20.0000 mL | Freq: Once | INTRAVENOUS | Status: AC | PRN
  Administered 2024-03-24: 20 mL via INTRAVENOUS

## 2024-03-25 ENCOUNTER — Other Ambulatory Visit (HOSPITAL_COMMUNITY): Payer: Self-pay

## 2024-03-25 ENCOUNTER — Other Ambulatory Visit: Payer: Self-pay | Admitting: Neurology

## 2024-03-25 ENCOUNTER — Encounter: Payer: Self-pay | Admitting: Adult Health

## 2024-03-25 DIAGNOSIS — G35 Multiple sclerosis: Secondary | ICD-10-CM

## 2024-03-29 ENCOUNTER — Other Ambulatory Visit: Payer: Self-pay

## 2024-03-29 NOTE — Telephone Encounter (Signed)
 See result message on 03/25/24 regarding this Rx.

## 2024-03-30 ENCOUNTER — Other Ambulatory Visit: Payer: Self-pay

## 2024-03-30 NOTE — Telephone Encounter (Signed)
 Called pt at [phone]. She is wondering if she should stop Gilenya  and stay off DMT all together given her age or would Dr. Vear recommend starting another medication [facility]) as mentioned by Harlene. Aware I will speak with Dr. Vear and call her back to let her know his recommendation.

## 2024-03-30 NOTE — Telephone Encounter (Signed)
 Dr. Vear- what would you recommend for her? She wanted your input on this

## 2024-03-30 NOTE — Telephone Encounter (Signed)
 Sent message to Dr. Vear for recommendation, waiting on response prior to refilling

## 2024-03-31 ENCOUNTER — Other Ambulatory Visit: Payer: Self-pay

## 2024-03-31 ENCOUNTER — Other Ambulatory Visit: Payer: Self-pay | Admitting: Neurology

## 2024-03-31 DIAGNOSIS — G35 Multiple sclerosis: Secondary | ICD-10-CM

## 2024-04-01 ENCOUNTER — Other Ambulatory Visit (HOSPITAL_COMMUNITY): Payer: Self-pay

## 2024-04-01 NOTE — Telephone Encounter (Signed)
 Dr. Vear, patient currently taking Gilenya  every other day. Does this need to be tapered anyway or can she just stop?

## 2024-04-02 ENCOUNTER — Other Ambulatory Visit: Payer: Self-pay

## 2024-04-02 NOTE — Progress Notes (Signed)
 Disenrolling- refill was denied with note medication is being discontinued. Per 7.31.25 chart encounter patient will stop fingolimod .

## 2024-04-13 DIAGNOSIS — Z6837 Body mass index (BMI) 37.0-37.9, adult: Secondary | ICD-10-CM | POA: Diagnosis not present

## 2024-04-13 DIAGNOSIS — Z01419 Encounter for gynecological examination (general) (routine) without abnormal findings: Secondary | ICD-10-CM | POA: Diagnosis not present

## 2024-04-30 ENCOUNTER — Other Ambulatory Visit (HOSPITAL_COMMUNITY): Payer: Self-pay

## 2024-07-21 ENCOUNTER — Other Ambulatory Visit (HOSPITAL_COMMUNITY): Payer: Self-pay

## 2024-07-21 DIAGNOSIS — E785 Hyperlipidemia, unspecified: Secondary | ICD-10-CM | POA: Diagnosis not present

## 2024-07-21 DIAGNOSIS — Z0000 Encounter for general adult medical examination without abnormal findings: Secondary | ICD-10-CM | POA: Diagnosis not present

## 2024-07-21 DIAGNOSIS — E559 Vitamin D deficiency, unspecified: Secondary | ICD-10-CM | POA: Diagnosis not present

## 2024-07-21 DIAGNOSIS — M179 Osteoarthritis of knee, unspecified: Secondary | ICD-10-CM | POA: Diagnosis not present

## 2024-07-21 DIAGNOSIS — D72819 Decreased white blood cell count, unspecified: Secondary | ICD-10-CM | POA: Diagnosis not present

## 2024-07-21 DIAGNOSIS — G35D Multiple sclerosis, unspecified: Secondary | ICD-10-CM | POA: Diagnosis not present

## 2024-07-21 DIAGNOSIS — Z6837 Body mass index (BMI) 37.0-37.9, adult: Secondary | ICD-10-CM | POA: Diagnosis not present

## 2024-07-21 DIAGNOSIS — R7303 Prediabetes: Secondary | ICD-10-CM | POA: Diagnosis not present

## 2024-07-21 DIAGNOSIS — I10 Essential (primary) hypertension: Secondary | ICD-10-CM | POA: Diagnosis not present

## 2024-07-21 MED ORDER — LOSARTAN POTASSIUM 50 MG PO TABS
50.0000 mg | ORAL_TABLET | Freq: Every day | ORAL | 5 refills | Status: AC
  Filled 2024-07-21: qty 90, 90d supply, fill #0

## 2024-07-21 MED ORDER — ATORVASTATIN CALCIUM 10 MG PO TABS
10.0000 mg | ORAL_TABLET | Freq: Every day | ORAL | 5 refills | Status: AC
  Filled 2024-07-21 (×2): qty 90, 90d supply, fill #0

## 2024-07-29 ENCOUNTER — Other Ambulatory Visit: Payer: Self-pay | Admitting: Internal Medicine

## 2024-07-29 DIAGNOSIS — Z1231 Encounter for screening mammogram for malignant neoplasm of breast: Secondary | ICD-10-CM

## 2024-08-10 ENCOUNTER — Ambulatory Visit: Admitting: Adult Health

## 2024-08-25 ENCOUNTER — Ambulatory Visit
Admission: RE | Admit: 2024-08-25 | Discharge: 2024-08-25 | Disposition: A | Discharge status: Home / Self Care | Source: Ambulatory Visit | Attending: Internal Medicine | Admitting: Internal Medicine

## 2024-08-25 DIAGNOSIS — Z1231 Encounter for screening mammogram for malignant neoplasm of breast: Secondary | ICD-10-CM

## 2024-08-31 ENCOUNTER — Other Ambulatory Visit: Payer: Self-pay | Admitting: Internal Medicine

## 2024-08-31 DIAGNOSIS — R928 Other abnormal and inconclusive findings on diagnostic imaging of breast: Secondary | ICD-10-CM

## 2024-09-07 ENCOUNTER — Ambulatory Visit
Admission: RE | Admit: 2024-09-07 | Discharge: 2024-09-07 | Disposition: A | Source: Ambulatory Visit | Attending: Internal Medicine | Admitting: Internal Medicine

## 2024-09-07 DIAGNOSIS — R928 Other abnormal and inconclusive findings on diagnostic imaging of breast: Secondary | ICD-10-CM

## 2025-03-16 ENCOUNTER — Ambulatory Visit: Admitting: Neurology
